# Patient Record
Sex: Female | Born: 1950 | Race: White | Hispanic: No | Marital: Married | Smoking: Former smoker
Health system: Southern US, Community
[De-identification: ages and names within clinical notes are randomized; demographics above are authoritative.]

## PROBLEM LIST (undated history)

## (undated) DIAGNOSIS — Z9289 Personal history of other medical treatment: Secondary | ICD-10-CM

## (undated) DIAGNOSIS — J189 Pneumonia, unspecified organism: Secondary | ICD-10-CM

## (undated) DIAGNOSIS — R51 Headache: Secondary | ICD-10-CM

## (undated) DIAGNOSIS — R059 Cough, unspecified: Secondary | ICD-10-CM

## (undated) DIAGNOSIS — R011 Cardiac murmur, unspecified: Secondary | ICD-10-CM

## (undated) DIAGNOSIS — T4145XA Adverse effect of unspecified anesthetic, initial encounter: Secondary | ICD-10-CM

## (undated) DIAGNOSIS — R05 Cough: Secondary | ICD-10-CM

## (undated) DIAGNOSIS — I509 Heart failure, unspecified: Secondary | ICD-10-CM

## (undated) DIAGNOSIS — C9110 Chronic lymphocytic leukemia of B-cell type not having achieved remission: Secondary | ICD-10-CM

## (undated) DIAGNOSIS — R112 Nausea with vomiting, unspecified: Secondary | ICD-10-CM

## (undated) DIAGNOSIS — Z87891 Personal history of nicotine dependence: Secondary | ICD-10-CM

## (undated) DIAGNOSIS — I5033 Acute on chronic diastolic (congestive) heart failure: Principal | ICD-10-CM

## (undated) DIAGNOSIS — T8859XA Other complications of anesthesia, initial encounter: Secondary | ICD-10-CM

## (undated) DIAGNOSIS — Z9889 Other specified postprocedural states: Secondary | ICD-10-CM

## (undated) DIAGNOSIS — F419 Anxiety disorder, unspecified: Secondary | ICD-10-CM

## (undated) DIAGNOSIS — I050 Rheumatic mitral stenosis: Secondary | ICD-10-CM

## (undated) DIAGNOSIS — K219 Gastro-esophageal reflux disease without esophagitis: Secondary | ICD-10-CM

## (undated) DIAGNOSIS — M797 Fibromyalgia: Secondary | ICD-10-CM

## (undated) DIAGNOSIS — I340 Nonrheumatic mitral (valve) insufficiency: Secondary | ICD-10-CM

## (undated) DIAGNOSIS — I499 Cardiac arrhythmia, unspecified: Secondary | ICD-10-CM

## (undated) DIAGNOSIS — I5032 Chronic diastolic (congestive) heart failure: Secondary | ICD-10-CM

## (undated) DIAGNOSIS — Z954 Presence of other heart-valve replacement: Secondary | ICD-10-CM

## (undated) DIAGNOSIS — K5792 Diverticulitis of intestine, part unspecified, without perforation or abscess without bleeding: Secondary | ICD-10-CM

## (undated) HISTORY — PX: TOTAL VAGINAL HYSTERECTOMY: SHX2548

## (undated) HISTORY — DX: Personal history of other medical treatment: Z92.89

## (undated) HISTORY — PX: NO PAST SURGERIES: SHX2092

## (undated) HISTORY — PX: TONSILLECTOMY: SUR1361

## (undated) HISTORY — PX: APPENDECTOMY: SHX54

---

## 1997-12-16 ENCOUNTER — Encounter: Payer: Self-pay | Admitting: Internal Medicine

## 1997-12-16 ENCOUNTER — Inpatient Hospital Stay (HOSPITAL_COMMUNITY): Admission: EM | Admit: 1997-12-16 | Discharge: 1997-12-19 | Payer: Self-pay | Admitting: Emergency Medicine

## 1998-02-14 ENCOUNTER — Ambulatory Visit (HOSPITAL_COMMUNITY): Admission: RE | Admit: 1998-02-14 | Discharge: 1998-02-14 | Payer: Self-pay | Admitting: Gastroenterology

## 1998-02-14 ENCOUNTER — Encounter: Payer: Self-pay | Admitting: Gastroenterology

## 1999-06-17 ENCOUNTER — Encounter: Payer: Self-pay | Admitting: Internal Medicine

## 1999-06-17 ENCOUNTER — Encounter: Admission: RE | Admit: 1999-06-17 | Discharge: 1999-06-17 | Payer: Self-pay | Admitting: Internal Medicine

## 1999-08-06 ENCOUNTER — Encounter: Payer: Self-pay | Admitting: Internal Medicine

## 1999-08-06 ENCOUNTER — Inpatient Hospital Stay (HOSPITAL_COMMUNITY): Admission: AD | Admit: 1999-08-06 | Discharge: 1999-08-11 | Payer: Self-pay | Admitting: Internal Medicine

## 1999-12-25 ENCOUNTER — Encounter: Payer: Self-pay | Admitting: Internal Medicine

## 1999-12-25 ENCOUNTER — Encounter: Admission: RE | Admit: 1999-12-25 | Discharge: 1999-12-25 | Payer: Self-pay | Admitting: Internal Medicine

## 2000-09-06 ENCOUNTER — Encounter: Admission: RE | Admit: 2000-09-06 | Discharge: 2000-09-06 | Payer: Self-pay | Admitting: Internal Medicine

## 2000-09-06 ENCOUNTER — Encounter: Payer: Self-pay | Admitting: Internal Medicine

## 2000-09-18 ENCOUNTER — Emergency Department (HOSPITAL_COMMUNITY): Admission: EM | Admit: 2000-09-18 | Discharge: 2000-09-18 | Payer: Self-pay | Admitting: *Deleted

## 2001-04-15 ENCOUNTER — Ambulatory Visit (HOSPITAL_BASED_OUTPATIENT_CLINIC_OR_DEPARTMENT_OTHER): Admission: RE | Admit: 2001-04-15 | Discharge: 2001-04-15 | Payer: Self-pay | Admitting: Otolaryngology

## 2001-10-26 ENCOUNTER — Emergency Department (HOSPITAL_COMMUNITY): Admission: EM | Admit: 2001-10-26 | Discharge: 2001-10-26 | Payer: Self-pay | Admitting: Emergency Medicine

## 2001-10-26 ENCOUNTER — Encounter: Payer: Self-pay | Admitting: Emergency Medicine

## 2001-10-26 ENCOUNTER — Inpatient Hospital Stay (HOSPITAL_COMMUNITY): Admission: EM | Admit: 2001-10-26 | Discharge: 2001-10-30 | Payer: Self-pay | Admitting: Internal Medicine

## 2001-10-26 ENCOUNTER — Encounter: Payer: Self-pay | Admitting: Internal Medicine

## 2001-10-27 ENCOUNTER — Encounter: Payer: Self-pay | Admitting: Internal Medicine

## 2001-10-28 ENCOUNTER — Encounter: Payer: Self-pay | Admitting: Internal Medicine

## 2002-03-26 ENCOUNTER — Emergency Department (HOSPITAL_COMMUNITY): Admission: EM | Admit: 2002-03-26 | Discharge: 2002-03-26 | Payer: Self-pay | Admitting: Emergency Medicine

## 2002-03-26 ENCOUNTER — Encounter: Payer: Self-pay | Admitting: Emergency Medicine

## 2002-06-28 ENCOUNTER — Ambulatory Visit (HOSPITAL_COMMUNITY): Admission: RE | Admit: 2002-06-28 | Discharge: 2002-06-28 | Payer: Self-pay | Admitting: *Deleted

## 2008-01-02 ENCOUNTER — Ambulatory Visit: Payer: Self-pay | Admitting: Hematology and Oncology

## 2008-01-12 ENCOUNTER — Ambulatory Visit (HOSPITAL_COMMUNITY): Admission: RE | Admit: 2008-01-12 | Discharge: 2008-01-12 | Payer: Self-pay | Admitting: Hematology and Oncology

## 2008-01-12 LAB — COMPREHENSIVE METABOLIC PANEL
ALT: 19 U/L (ref 0–35)
AST: 20 U/L (ref 0–37)
Calcium: 9.3 mg/dL (ref 8.4–10.5)
Chloride: 106 mEq/L (ref 96–112)
Creatinine, Ser: 0.84 mg/dL (ref 0.40–1.20)
Total Bilirubin: 0.5 mg/dL (ref 0.3–1.2)

## 2008-01-12 LAB — URINALYSIS, MICROSCOPIC - CHCC
Bilirubin (Urine): NEGATIVE
Glucose: NEGATIVE g/dL
Ketones: NEGATIVE mg/dL
Leukocyte Esterase: NEGATIVE

## 2008-01-12 LAB — CBC WITH DIFFERENTIAL/PLATELET
BASO%: 0.3 % (ref 0.0–2.0)
Basophils Absolute: 0 10*3/uL (ref 0.0–0.1)
EOS%: 0.3 % (ref 0.0–7.0)
Eosinophils Absolute: 0 10*3/uL (ref 0.0–0.5)
HCT: 42.1 % (ref 34.8–46.6)
HGB: 14.5 g/dL (ref 11.6–15.9)
LYMPH%: 31.8 % (ref 14.0–48.0)
MCH: 29.8 pg (ref 26.0–34.0)
MCHC: 34.4 g/dL (ref 32.0–36.0)
MCV: 86.6 fL (ref 81.0–101.0)
MONO#: 0.9 10*3/uL (ref 0.1–0.9)
MONO%: 8 % (ref 0.0–13.0)
NEUT#: 6.6 10*3/uL — ABNORMAL HIGH (ref 1.5–6.5)
NEUT%: 59.6 % (ref 39.6–76.8)
Platelets: 200 10*3/uL (ref 145–400)
RBC: 4.87 10*6/uL (ref 3.70–5.32)
RDW: 13.6 % (ref 11.3–14.5)
WBC: 11 10*3/uL — ABNORMAL HIGH (ref 3.9–10.0)
lymph#: 3.5 10*3/uL — ABNORMAL HIGH (ref 0.9–3.3)

## 2008-01-16 LAB — PROTEIN ELECTROPHORESIS, SERUM, WITH REFLEX
Albumin ELP: 57.2 % (ref 55.8–66.1)
Alpha-1-Globulin: 5.3 % — ABNORMAL HIGH (ref 2.9–4.9)
Beta 2: 5.6 % (ref 3.2–6.5)
Beta Globulin: 7.5 % — ABNORMAL HIGH (ref 4.7–7.2)

## 2008-02-16 ENCOUNTER — Ambulatory Visit: Payer: Self-pay | Admitting: Internal Medicine

## 2008-02-16 DIAGNOSIS — R05 Cough: Secondary | ICD-10-CM

## 2008-02-16 DIAGNOSIS — K219 Gastro-esophageal reflux disease without esophagitis: Secondary | ICD-10-CM

## 2008-02-16 DIAGNOSIS — IMO0001 Reserved for inherently not codable concepts without codable children: Secondary | ICD-10-CM

## 2008-02-16 DIAGNOSIS — G43909 Migraine, unspecified, not intractable, without status migrainosus: Secondary | ICD-10-CM | POA: Insufficient documentation

## 2008-02-28 ENCOUNTER — Ambulatory Visit: Payer: Self-pay | Admitting: Internal Medicine

## 2008-03-05 ENCOUNTER — Ambulatory Visit: Payer: Self-pay | Admitting: Cardiovascular Disease

## 2008-03-13 ENCOUNTER — Ambulatory Visit: Payer: Self-pay | Admitting: Internal Medicine

## 2008-04-10 ENCOUNTER — Ambulatory Visit: Payer: Self-pay | Admitting: Internal Medicine

## 2008-04-11 ENCOUNTER — Telehealth (INDEPENDENT_AMBULATORY_CARE_PROVIDER_SITE_OTHER): Payer: Self-pay | Admitting: *Deleted

## 2008-04-24 ENCOUNTER — Encounter: Payer: Self-pay | Admitting: Internal Medicine

## 2008-07-03 ENCOUNTER — Encounter: Payer: Self-pay | Admitting: Internal Medicine

## 2008-07-09 ENCOUNTER — Ambulatory Visit: Payer: Self-pay | Admitting: Hematology and Oncology

## 2009-01-18 ENCOUNTER — Inpatient Hospital Stay (HOSPITAL_BASED_OUTPATIENT_CLINIC_OR_DEPARTMENT_OTHER): Admission: RE | Admit: 2009-01-18 | Discharge: 2009-01-18 | Payer: Self-pay | Admitting: Cardiology

## 2010-08-22 NOTE — Discharge Summary (Signed)
[place]. [facility]  Patient:    Priscilla Stevens, Priscilla Stevens                   MRN: [id number] Adm. Date:  [id number] Disc. Date: [id number] Attending:  Leanord Hawking                           Discharge Summary  ADMITTING DIAGNOSIS: Abdominal pain, question of pyelonephritis.  DISCHARGE DIAGNOSES:  1. Diverticulitis.  2. Obesity.  3. Chronic obstructive pulmonary disease.  4. Depression.  5. Past history of elevated blood pressure, although hypotensive in hospital.  6. History of iron deficiency anemia.  7. Gastroesophageal reflux disease.  HISTORY OF PRESENT ILLNESS: Priscilla Stevens is a nice 60 year old white female who had been feeling poorly the week prior to admission with off and on abdominal pain.  On the day prior to admission she developed a temperature of 102 degrees associated with left flank pain, no diarrhea.  Bowels had been moving normally.  She had Hemoccult negative stools on July 31, 1999.  The evening prior to admission she was seen in the walk-in clinic and was felt to have a UTI or pyelonephritis and was given Cipro and Darvocet.  The pain has continued and was quite severe, and she had vomited twice the morning of admission.  PHYSICAL EXAMINATION:  VITAL SIGNS: Temperature 97.1 degrees, pulse 76, blood pressure 110/70.  HEENT: Unremarkable.  LUNGS: Clear.  HEART: Regular rate and rhythm without murmurs, rubs, or gallops.  ABDOMEN: Obese, active bowel sounds.  Tender in the left lower quadrant and in the left flank.  She had positive CVA tenderness.  LABORATORY DATA: Admission hemoglobin 13.4, platelet count 217,000, WBC 16,500.  Sodium 137, potassium 3.6, chloride 102, bicarbonate 27, glucose 94, BUN 10, creatinine 0.9.  Calcium 9.1, total protein 7.2, albumin 3.7, AST 20, ALT 20.  Urinalysis normal.  Chest x-ray revealed no active disease.  HOSPITAL COURSE: The patient was empirically placed on Unasyn at the time  of admission.  CT scan of the abdomen and pelvis with and without contrast revealed findings compatible with diverticulitis of the descending colon.  She was continued on Unasyn and Demerol.  Her temperature maximum was 100.9 degrees and over the period of 48 hours her pain gradually improved and she was able to eat a regular diet.  Her hospitalization was complicated by hypotension, with systolics running as low as 88-99.  Her Demerol was discontinued the day prior to discharge and her systolic pressure had come up to 108, and her pain was totally resolved.  She did have a mild anemia, although her stool sent to the laboratory was Hemoccult negative, and hemoglobin came back at 11.4.  CBC is pending at this time.  DISCHARGE CONDITION: The patient is discharged home in improved condition.  DISCHARGE MEDICATIONS:  1. Prevacid 30 mg q.d.  2. Wellbutrin 150 mg b.i.d.  3. Trazodone 50 mg at night.  4. Estradiol 2 mg q.d.  5. Augmentin 875 mg b.i.d. for 5 days.  FOLLOW-UP:  She will see Dr. Prentiss Bells in the office in about two weeks. DD:  08/11/99 TD:  08/12/99 Job: [id number]

## 2010-08-22 NOTE — H&P (Signed)
[place]. [facility]  Patient:    Priscilla Stevens, Priscilla Stevens                     MRN: [id number] Adm. Date:  08/06/99 Attending:  Ann Maki T. Stoneking, M.D.                         History and Physical  IDENTIFYING DATA:  Priscilla Stevens is a 60 year old white female followed by Dr. Mosetta Anis.  She was in her usual state of health until a week ago. She started feeling poorly off and on with stomach pain, more on the left side.  Then yesterday, she developed a temperature of 102 associated with left flank pain, no diarrhea.  She tells me that her bowels have been moving normally and in fact she had a recent physical exam and Hemoccults were negative x 3 on July 31, 1999.  Yesterday evening, she was seen in the walk-in clinic and found to have an abnormal urinalysis and was placed on Cipro and Darvocet.  Today, she has continued with the abdominal pain, flank pain, and vomiting.  ALLERGIES:  No known drug allergies.  PAST MEDICAL HISTORY:  Obesity, COPD, depression, hypertension, iron-deficiency anemia, GERD.  PAST SURGICAL HISTORY:  She has had a hysterectomy.  CURRENT MEDICATIONS: 1. Prevacid 30 mg a day. 2. Wellbutrin 150 mg q.12h. 3. Trazodone 50 mg q.hs. 4. Estradiol 2 mg a day.  SOCIAL HISTORY:  Remarkable for cigarette use.  She does not consume alcohol.  FAMILY HISTORY:  Father died in his 38s of lung cancer.  Mother in good health.  Siblings in good health.  REVIEW OF SYSTEMS:  No headache.  No change in vision or hearing.  No cough. No shortness of breath.  GI:  Please see HPI.  GU:  Denies any specific burning or stinging on urination.  She does state that she may have had a kidney stone years ago.  PHYSICAL EXAMINATION:  VITAL SIGNS:  Temperature 97.1, pulse 76, blood pressure 110/70.  HEENT:  Pupils are equal, round and reactive to light.  Disks sharp. Vasculature normal.  TMs normal.  Oropharynx moist.  NECK:  No JVD or  thyromegaly.  LUNGS:  Clear.  HEART:  Regular rate and rhythm without murmurs, rubs, or gallops.  ABDOMEN:  Bowel sounds present but decreased.  Tenderness, especially in the left lower quadrant and left upper quadrant and she had definite percussion tenderness at the left CVA.  EXTREMITIES:  No lower extremity edema.  ASSESSMENT: 1. Febrile illness with left flank, left lower quadrant pain with a recent    abnormal urinalysis.  Must be concerned about pylo and she will need    antibiotics IV, as she is having trouble with nausea and vomiting.  Also a    possibility of diverticulitis and also a possibility of kidney stone. 2. Dehydration from nausea and vomiting.  PLAN:  We will admit.  We will begin IV Unasyn.  We will check blood and urine cultures.  Chest x-ray to rule out any abnormalities at the left base where she is having pain.  Also obtain a CT scan of the abdomen and pelvis to look for possibility of diverticulitis and the possibility of kidney stone. DD:  08/06/99 TD:  08/06/99 Job: [id number]

## 2010-08-22 NOTE — Discharge Summary (Signed)
NAME:  Priscilla Stevens, Priscilla Stevens                      ACCOUNT NO.:  [id number]   MEDICAL RECORD NO.:  [id number]                   PATIENT TYPE:  INP   LOCATION:  0481                                 FACILITY:  [facility]   PHYSICIAN:  Jerl Santos, M.D.              DATE OF BIRTH:  [birth year]   DATE OF ADMISSION:  10/26/2001  DATE OF DISCHARGE:  10/30/2001                                 DISCHARGE SUMMARY   HISTORY OF PRESENT ILLNESS:  The patient is a 60 year old lady who was  admitted on July 23 with a three day history of nausea, vomiting and diffuse  abdominal pain. Prior to admission, the patient had a CT scan of the abdomen  which did not provide a definite diagnosis.   PAST MEDICAL HISTORY:  Remarkable for a previous hysterectomy.   PHYSICAL EXAMINATION ON ADMISSION:  VITAL SIGNS:  Temperature is 98.8, pulse  96, blood pressure is 140/98. HEENT:  Within normal limits. CHEST:  Clear.  BACK:  Revealed chronic low back pain to palpation and percussion.  CARDIOVASCULAR:  Revealed normal S1, S2 without murmurs, rubs or gallops.  ABDOMEN:  Benign, normal bowel sounds without masses or tenderness. There is  no guarding or rebound. NEUROLOGIC:  Testing and examination of extremities  was normal.   HOSPITAL COURSE:  On admission, the patient was placed on a regimen of IV  hydration with D5 1/2 normal saline at 10 mEq of KCl. She was made n.p.o.  and she was given morphine for pain. Studies obtained included a chest x-ray  which showed only mild cardiomegaly and a hepatobiliary scan which was  normal. Abdominal ultrasound showed only mild fatty infiltration of the  liver. I consulted Dr. Luan Pulling of the surgery service who felt the  patient did not have a surgical problem. Subsequently, the patient indicated  that her pain had changed such that she was now experiencing pain in the low  back area. She ultimately underwent an MRI scan of the back which was  completely normal. She was  administered cortisone for possible back  discomfort and musculoskeletal origin and ultimately on July 29 was  discharged. At the time of discharge, she was advised to continue her usual  hom medications and also to take Vicodin 1 or 2 every 4h p.r.n. for pain,  Skelaxin at 400 mg 1 or 2 p.r.n. for muscle spasm and prednisone 40 mg q.d.  for 4 days. A follow-up appointment was to be arranged with Dr. Tresa Endo in  two weeks.   DISCHARGE DIAGNOSES:  1. Persistent abdominal pain, nausea and vomiting, resolved possibly     secondary to gastroenteritis.  2. Depression.  3. Chronic obstructive pulmonary disease.  4. Chronic migraine headaches.  5. Obesity.  6. Status post hysterectomy.  7. Chronic low back pain possibly of musculoskeletal origin.  Jerl Santos, M.D.    SWY/MEDQ  D:  11/04/2001  T:  11/05/2001  Job:  [id number]

## 2010-09-22 ENCOUNTER — Emergency Department (HOSPITAL_COMMUNITY): Payer: BC Managed Care – PPO

## 2010-09-22 ENCOUNTER — Emergency Department (HOSPITAL_COMMUNITY)
Admission: EM | Admit: 2010-09-22 | Discharge: 2010-09-23 | Disposition: A | Discharge status: Home / Self Care | Payer: BC Managed Care – PPO | Attending: Emergency Medicine | Admitting: Emergency Medicine

## 2010-09-22 DIAGNOSIS — X500XXA Overexertion from strenuous movement or load, initial encounter: Secondary | ICD-10-CM | POA: Insufficient documentation

## 2010-09-22 DIAGNOSIS — M25579 Pain in unspecified ankle and joints of unspecified foot: Secondary | ICD-10-CM | POA: Insufficient documentation

## 2010-09-22 DIAGNOSIS — S93409A Sprain of unspecified ligament of unspecified ankle, initial encounter: Secondary | ICD-10-CM | POA: Insufficient documentation

## 2010-12-02 DIAGNOSIS — J329 Chronic sinusitis, unspecified: Secondary | ICD-10-CM | POA: Insufficient documentation

## 2012-06-19 ENCOUNTER — Inpatient Hospital Stay (HOSPITAL_COMMUNITY)
Admission: EM | Admit: 2012-06-19 | Discharge: 2012-06-23 | DRG: 286 | Disposition: A | Discharge status: Home Health | Payer: Medicare Other | Attending: Internal Medicine | Admitting: Internal Medicine

## 2012-06-19 ENCOUNTER — Emergency Department (HOSPITAL_COMMUNITY): Payer: Medicare Other

## 2012-06-19 ENCOUNTER — Encounter (HOSPITAL_COMMUNITY): Payer: Self-pay | Admitting: *Deleted

## 2012-06-19 DIAGNOSIS — C9110 Chronic lymphocytic leukemia of B-cell type not having achieved remission: Secondary | ICD-10-CM

## 2012-06-19 DIAGNOSIS — K219 Gastro-esophageal reflux disease without esophagitis: Secondary | ICD-10-CM | POA: Diagnosis present

## 2012-06-19 DIAGNOSIS — F3289 Other specified depressive episodes: Secondary | ICD-10-CM | POA: Diagnosis present

## 2012-06-19 DIAGNOSIS — I4892 Unspecified atrial flutter: Secondary | ICD-10-CM

## 2012-06-19 DIAGNOSIS — D72829 Elevated white blood cell count, unspecified: Secondary | ICD-10-CM

## 2012-06-19 DIAGNOSIS — I340 Nonrheumatic mitral (valve) insufficiency: Secondary | ICD-10-CM | POA: Diagnosis present

## 2012-06-19 DIAGNOSIS — I509 Heart failure, unspecified: Secondary | ICD-10-CM

## 2012-06-19 DIAGNOSIS — J811 Chronic pulmonary edema: Secondary | ICD-10-CM | POA: Diagnosis present

## 2012-06-19 DIAGNOSIS — R05 Cough: Secondary | ICD-10-CM

## 2012-06-19 DIAGNOSIS — I5033 Acute on chronic diastolic (congestive) heart failure: Principal | ICD-10-CM

## 2012-06-19 DIAGNOSIS — F411 Generalized anxiety disorder: Secondary | ICD-10-CM | POA: Diagnosis present

## 2012-06-19 DIAGNOSIS — J9600 Acute respiratory failure, unspecified whether with hypoxia or hypercapnia: Secondary | ICD-10-CM | POA: Diagnosis present

## 2012-06-19 DIAGNOSIS — I471 Supraventricular tachycardia, unspecified: Secondary | ICD-10-CM

## 2012-06-19 DIAGNOSIS — R7989 Other specified abnormal findings of blood chemistry: Secondary | ICD-10-CM

## 2012-06-19 DIAGNOSIS — R0603 Acute respiratory distress: Secondary | ICD-10-CM | POA: Diagnosis present

## 2012-06-19 DIAGNOSIS — I050 Rheumatic mitral stenosis: Secondary | ICD-10-CM

## 2012-06-19 DIAGNOSIS — Z79899 Other long term (current) drug therapy: Secondary | ICD-10-CM

## 2012-06-19 DIAGNOSIS — G43909 Migraine, unspecified, not intractable, without status migrainosus: Secondary | ICD-10-CM | POA: Diagnosis present

## 2012-06-19 DIAGNOSIS — I498 Other specified cardiac arrhythmias: Secondary | ICD-10-CM | POA: Diagnosis present

## 2012-06-19 DIAGNOSIS — IMO0001 Reserved for inherently not codable concepts without codable children: Secondary | ICD-10-CM | POA: Diagnosis present

## 2012-06-19 DIAGNOSIS — R599 Enlarged lymph nodes, unspecified: Secondary | ICD-10-CM | POA: Diagnosis present

## 2012-06-19 DIAGNOSIS — F329 Major depressive disorder, single episode, unspecified: Secondary | ICD-10-CM | POA: Diagnosis present

## 2012-06-19 DIAGNOSIS — R000 Tachycardia, unspecified: Secondary | ICD-10-CM | POA: Diagnosis present

## 2012-06-19 DIAGNOSIS — R059 Cough, unspecified: Secondary | ICD-10-CM

## 2012-06-19 DIAGNOSIS — Z87891 Personal history of nicotine dependence: Secondary | ICD-10-CM

## 2012-06-19 DIAGNOSIS — I052 Rheumatic mitral stenosis with insufficiency: Secondary | ICD-10-CM | POA: Diagnosis present

## 2012-06-19 DIAGNOSIS — I059 Rheumatic mitral valve disease, unspecified: Secondary | ICD-10-CM

## 2012-06-19 HISTORY — DX: Diverticulitis of intestine, part unspecified, without perforation or abscess without bleeding: K57.92

## 2012-06-19 HISTORY — DX: Cough, unspecified: R05.9

## 2012-06-19 HISTORY — DX: Cough: R05

## 2012-06-19 HISTORY — DX: Chronic lymphocytic leukemia of B-cell type not having achieved remission: C91.10

## 2012-06-19 HISTORY — DX: Pneumonia, unspecified organism: J18.9

## 2012-06-19 HISTORY — DX: Rheumatic mitral stenosis: I05.0

## 2012-06-19 HISTORY — DX: Fibromyalgia: M79.7

## 2012-06-19 HISTORY — DX: Anxiety disorder, unspecified: F41.9

## 2012-06-19 HISTORY — DX: Personal history of nicotine dependence: Z87.891

## 2012-06-19 HISTORY — DX: Nonrheumatic mitral (valve) insufficiency: I34.0

## 2012-06-19 HISTORY — DX: Morbid (severe) obesity due to excess calories: E66.01

## 2012-06-19 HISTORY — DX: Acute on chronic diastolic (congestive) heart failure: I50.33

## 2012-06-19 MED ORDER — ADENOSINE 6 MG/2ML IV SOLN
INTRAVENOUS | Status: AC
  Administered 2012-06-19: 12 mg
  Filled 2012-06-19: qty 6

## 2012-06-19 MED ORDER — METOPROLOL TARTRATE 1 MG/ML IV SOLN
5.0000 mg | Freq: Once | INTRAVENOUS | Status: AC
  Administered 2012-06-20: 5 mg via INTRAVENOUS
  Filled 2012-06-19: qty 5

## 2012-06-19 MED ORDER — ADENOSINE 6 MG/2ML IV SOLN
INTRAVENOUS | Status: AC
  Administered 2012-06-19: 12 mg
  Filled 2012-06-19: qty 4

## 2012-06-19 MED ORDER — FUROSEMIDE 10 MG/ML IJ SOLN
40.0000 mg | Freq: Once | INTRAMUSCULAR | Status: AC
  Administered 2012-06-20: 40 mg via INTRAVENOUS
  Filled 2012-06-19: qty 4

## 2012-06-19 NOTE — ED Notes (Addendum)
HR remains 145 after 24mg  of adenosine.  Pt was given 18mg  of adenosine by EMS in route.

## 2012-06-19 NOTE — ED Provider Notes (Signed)
History     CSN: [id number]  Arrival date & time 06/19/12  2319   First MD Initiated Contact with Patient 06/19/12 2326     Chief complaint: Shortness of breath  (Consider location/radiation/quality/duration/timing/severity/associated sxs/prior treatment) Patient is a 62 y.o. female presenting with shortness of breath. The history is provided by the patient.  Shortness of Breath She had sudden onset at about 11 PM of difficulty breathing with associated tightness in her chest. There is no associated chest pain, nausea, diaphoresis. He came as noted rales and was concerned about pulmonary edema and placed on CPAP. She was noted to be tachycardic although she was not aware of any palpitations. As she was given adenosine in doses of 6 mg and then 12 mg with minimal result. She has a history of fibromyalgia but is not complaining of pain anywhere. She's never had symptoms like this before.  Past Medical History  Diagnosis Date  . Fibromyalgia   . Anxiety   . Pneumonia     History reviewed. No pertinent past surgical history.  No family history on file.  History  Substance Use Topics  . Smoking status: Not on file  . Smokeless tobacco: Not on file  . Alcohol Use: Not on file    OB History   Grav Para Term Preterm Abortions TAB SAB Ect Mult Living                  Review of Systems  Respiratory: Positive for shortness of breath.   All other systems reviewed and are negative.    Allergies  Aspirin  Home Medications  No current outpatient prescriptions on file.  BP 105/62  Pulse 114  Resp 24  SpO2 95%  Physical Exam  Nursing note and vitals reviewed.  62 year old female, on BiPAP, and in no acute distress. Vital signs are significant for tachycardia with heart rate of 160, and tachypnea with respiratory rate of 24. Oxygen saturation is 95%, which is normal. Head is normocephalic and atraumatic. PERRLA, EOMI. Oropharynx is clear. Neck is nontender and supple  without adenopathy or JVD. Back is nontender and there is no CVA tenderness. Lungs have diminished breath sounds throughout with bibasilar rales which are more prominent on the left. No wheezes are heard.. Chest is nontender. Heart is tachycardic without murmur. Abdomen is soft, flat, nontender without masses or hepatosplenomegaly and peristalsis is normoactive. Extremities have 1+ edema, full range of motion is present. Skin is warm and dry without rash. Neurologic: Mental status is normal, cranial nerves are intact, there are no motor or sensory deficits.  ED Course  Procedures (including critical care time)  Results for orders placed during the hospital encounter of 06/19/12  CBC WITH DIFFERENTIAL      Result Value Range   WBC 27.9 (*) 4.0 - 10.5 K/uL   RBC 5.16 (*) 3.87 - 5.11 MIL/uL   Hemoglobin 14.6  12.0 - 15.0 g/dL   HCT 65.7  84.6 - 96.2 %   MCV 85.3  78.0 - 100.0 fL   MCH 28.3  26.0 - 34.0 pg   MCHC 33.2  30.0 - 36.0 g/dL   RDW 95.2  84.1 - 32.4 %   Platelets 270  150 - 400 K/uL   Neutrophils Relative 59  43 - 77 %   Lymphocytes Relative 31  12 - 46 %   Monocytes Relative 9  3 - 12 %   Eosinophils Relative 1  0 - 5 %   Basophils  Relative 0  0 - 1 %   Neutro Abs 16.4 (*) 1.7 - 7.7 K/uL   Lymphs Abs 8.7 (*) 0.7 - 4.0 K/uL   Monocytes Absolute 2.5 (*) 0.1 - 1.0 K/uL   Eosinophils Absolute 0.3  0.0 - 0.7 K/uL   Basophils Absolute 0.0  0.0 - 0.1 K/uL   WBC Morphology ATYPICAL LYMPHOCYTES     Smear Review LARGE PLATELETS PRESENT    BASIC METABOLIC PANEL      Result Value Range   Sodium 139  135 - 145 mEq/L   Potassium 4.2  3.5 - 5.1 mEq/L   Chloride 102  96 - 112 mEq/L   CO2 24  19 - 32 mEq/L   Glucose, Bld 227 (*) 70 - 99 mg/dL   BUN 13  6 - 23 mg/dL   Creatinine, Ser 1.61  0.50 - 1.10 mg/dL   Calcium 9.1  8.4 - 09.6 mg/dL   GFR calc non Af Amer 54 (*) >90 mL/min   GFR calc Af Amer 63 (*) >90 mL/min  PRO B NATRIURETIC PEPTIDE      Result Value Range   Pro B  Natriuretic peptide (BNP) 1050.0 (*) 0 - 125 pg/mL  TROPONIN I      Result Value Range   Troponin I <0.30  <0.30 ng/mL  URINALYSIS, MICROSCOPIC ONLY      Result Value Range   Color, Urine YELLOW  YELLOW   APPearance CLEAR  CLEAR   Specific Gravity, Urine 1.017  1.005 - 1.030   pH 5.0  5.0 - 8.0   Glucose, UA NEGATIVE  NEGATIVE mg/dL   Hgb urine dipstick TRACE (*) NEGATIVE   Bilirubin Urine NEGATIVE  NEGATIVE   Ketones, ur NEGATIVE  NEGATIVE mg/dL   Protein, ur 30 (*) NEGATIVE mg/dL   Urobilinogen, UA 0.2  0.0 - 1.0 mg/dL   Nitrite NEGATIVE  NEGATIVE   Leukocytes, UA NEGATIVE  NEGATIVE   WBC, UA 0-2  <3 WBC/hpf   RBC / HPF 3-6  <3 RBC/hpf   Bacteria, UA RARE  RARE   Squamous Epithelial / LPF RARE  RARE   Casts HYALINE CASTS (*) NEGATIVE   Dg Chest Portable 1 View  06/20/2012  *RADIOLOGY REPORT*  Clinical Data: Severe shortness of breath.  Pulmonary edema. Cardiomegaly.  PORTABLE CHEST - 1 VIEW  Comparison: 12/27/2008  Findings: The shallow inspiration.  Cardiac enlargement with mild prominence of pulmonary vascularity.  Diffuse interstitial changes suggesting interstitial edema.  Changes are new since previous study.  No blunting of costophrenic angles.  No pneumothorax.  No focal airspace disease.  IMPRESSION: Cardiac enlargement with pulmonary vascular congestion and interstitial edema.   Original Report Authenticated By: Burman Nieves, M.D.     Images viewed by me.   Date: 06/19/2012  Rate: 160  Rhythm: supraventricular tachycardia (SVT)  QRS Axis: right  Intervals: normal  ST/T Wave abnormalities: nonspecific ST changes  Conduction Disutrbances:none  Narrative Interpretation: Paroxysmal supraventricular tachycardia with the minor ST changes which are probably rate related. Right axis deviation. No prior ECG available for comparison.  Old EKG Reviewed: none available    1. Supraventricular tachycardia   2. CHF, acute   3. Leukocytosis     CRITICAL CARE Performed  by: EAVWU,[email or web address]   Total critical care time: 45 minutes  Critical care time was exclusive of separately billable procedures and treating other patients.  Critical care was necessary to treat or prevent imminent or life-threatening deterioration.  Critical care was time spent  personally by me on the following activities: development of treatment plan with patient and/or surrogate as well as nursing, discussions with consultants, evaluation of patient's response to treatment, examination of patient, obtaining history from patient or surrogate, ordering and performing treatments and interventions, ordering and review of laboratory studies, ordering and review of radiographic studies, pulse oximetry and re-evaluation of patient's condition.   MDM  Supraventricular tachycardia with possible congestive heart failure. Old records are reviewed and she was noted to have a heart catheterization in 2010 which showed no significant coronary artery disease but evidence of diastolic dysfunction. Laboratory workup is initiated and chest x-ray obtained. ECG shows SVT.  Since she had already received 2 doses of adenosine, it was elected to proceed with a third dose of adenosine 12 mg. Following this, she was briefly in sinus rhythm before returning to PSVT. Because of a partial response it was elected to give a fourth dose of adenosine which did not cause any significant response. Chest x-ray does show evidence of mild pulmonary edema. She's given a dose of furosemide and a dose of metoprolol.  Following meds metoprolol, her heart rate has come down to 110-115 and is clearly sinus. She feels much better. WBC has come back markedly elevated at 27.9 with a normal differential and report of atypical lymphocytes. 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 However,  elevated WBC does not appear to be a pattern consistent with CLL. There is no lymphocytic predominance and there no smudge cells. Case is discussed with Dr. Mikeal Hawthorne of [facility] who agrees to admit the patient to request that CT angiogram be obtained to rule out pulmonary embolism.       Dione Booze, MD 06/20/12 [id number]

## 2012-06-19 NOTE — ED Notes (Signed)
Per EMS:  Pt called for chest tightness, upon EMS' arrival pt's HR was 158, went to 180.  EMS auscultated crackles in bilateral lower lobes.  EMS gave 6 of adenosine and then 12, pulse went down to 142.  Resps were 28-32 and shallow.  Pt alert and oriented x4.  Pt still having SOB on cpap.

## 2012-06-20 ENCOUNTER — Observation Stay (HOSPITAL_COMMUNITY): Payer: Medicare Other

## 2012-06-20 ENCOUNTER — Encounter (HOSPITAL_COMMUNITY): Payer: Self-pay | Admitting: Internal Medicine

## 2012-06-20 ENCOUNTER — Other Ambulatory Visit: Payer: Self-pay

## 2012-06-20 DIAGNOSIS — R0603 Acute respiratory distress: Secondary | ICD-10-CM | POA: Diagnosis present

## 2012-06-20 DIAGNOSIS — D72829 Elevated white blood cell count, unspecified: Secondary | ICD-10-CM | POA: Diagnosis present

## 2012-06-20 DIAGNOSIS — I059 Rheumatic mitral valve disease, unspecified: Secondary | ICD-10-CM

## 2012-06-20 DIAGNOSIS — R000 Tachycardia, unspecified: Secondary | ICD-10-CM | POA: Diagnosis present

## 2012-06-20 DIAGNOSIS — C9110 Chronic lymphocytic leukemia of B-cell type not having achieved remission: Secondary | ICD-10-CM | POA: Insufficient documentation

## 2012-06-20 DIAGNOSIS — I498 Other specified cardiac arrhythmias: Secondary | ICD-10-CM

## 2012-06-20 DIAGNOSIS — I5031 Acute diastolic (congestive) heart failure: Secondary | ICD-10-CM

## 2012-06-20 DIAGNOSIS — J811 Chronic pulmonary edema: Secondary | ICD-10-CM | POA: Diagnosis present

## 2012-06-20 DIAGNOSIS — I509 Heart failure, unspecified: Secondary | ICD-10-CM | POA: Diagnosis present

## 2012-06-20 DIAGNOSIS — I050 Rheumatic mitral stenosis: Secondary | ICD-10-CM

## 2012-06-20 DIAGNOSIS — I340 Nonrheumatic mitral (valve) insufficiency: Secondary | ICD-10-CM

## 2012-06-20 HISTORY — DX: Nonrheumatic mitral (valve) insufficiency: I34.0

## 2012-06-20 HISTORY — DX: Rheumatic mitral stenosis: I05.0

## 2012-06-20 HISTORY — DX: Morbid (severe) obesity due to excess calories: E66.01

## 2012-06-20 LAB — TROPONIN I
Troponin I: <0.3 ng/mL (ref <0.30)
Troponin I: <0.3 ng/mL (ref <0.30)
Troponin I: 0.43 ng/mL (ref <0.30)
Troponin I: <0.3 ng/mL (ref <0.30)

## 2012-06-20 LAB — CBC
Platelets: 198 10*3/uL (ref 150–400)
RDW: 14.7 % (ref 11.5–15.5)
WBC: 16.8 10*3/uL — ABNORMAL HIGH (ref 4.0–10.5)

## 2012-06-20 LAB — BASIC METABOLIC PANEL
BUN: 13 mg/dL (ref 6–23)
Chloride: 102 mEq/L (ref 96–112)
GFR calc Af Amer: 63 mL/min — ABNORMAL LOW (ref >90)
GFR calc non Af Amer: 54 mL/min — ABNORMAL LOW (ref >90)
Glucose, Bld: 227 mg/dL — ABNORMAL HIGH (ref 70–99)
Potassium: 4.2 mEq/L (ref 3.5–5.1)
Sodium: 139 mEq/L (ref 135–145)

## 2012-06-20 LAB — CBC WITH DIFFERENTIAL/PLATELET
Basophils Absolute: 0 10*3/uL (ref 0.0–0.1)
Eosinophils Relative: 1 % (ref 0–5)
HCT: 44 % (ref 36.0–46.0)
Lymphocytes Relative: 31 % (ref 12–46)
Lymphs Abs: 8.7 10*3/uL — ABNORMAL HIGH (ref 0.7–4.0)
MCV: 85.3 fL (ref 78.0–100.0)
Monocytes Relative: 9 % (ref 3–12)
Neutro Abs: 16.4 10*3/uL — ABNORMAL HIGH (ref 1.7–7.7)
RBC: 5.16 MIL/uL — ABNORMAL HIGH (ref 3.87–5.11)
RDW: 14.8 % (ref 11.5–15.5)
WBC: 27.9 10*3/uL — ABNORMAL HIGH (ref 4.0–10.5)

## 2012-06-20 LAB — COMPREHENSIVE METABOLIC PANEL
AST: 20 U/L (ref 0–37)
Albumin: 3.4 g/dL — ABNORMAL LOW (ref 3.5–5.2)
Alkaline Phosphatase: 88 U/L (ref 39–117)
Chloride: 101 mEq/L (ref 96–112)
Potassium: 4 mEq/L (ref 3.5–5.1)
Total Bilirubin: 0.3 mg/dL (ref 0.3–1.2)
Total Protein: 6.8 g/dL (ref 6.0–8.3)

## 2012-06-20 LAB — URINALYSIS, MICROSCOPIC ONLY
Ketones, ur: NEGATIVE mg/dL
Leukocytes, UA: NEGATIVE
Nitrite: NEGATIVE
Protein, ur: 30 mg/dL — AB

## 2012-06-20 LAB — TSH: TSH: 1.812 u[IU]/mL (ref 0.350–4.500)

## 2012-06-20 MED ORDER — LEVALBUTEROL HCL 0.63 MG/3ML IN NEBU
0.6300 mg | INHALATION_SOLUTION | RESPIRATORY_TRACT | Status: DC | PRN
  Filled 2012-06-20: qty 3

## 2012-06-20 MED ORDER — TRAZODONE HCL 100 MG PO TABS
100.0000 mg | ORAL_TABLET | Freq: Every day | ORAL | Status: DC
  Administered 2012-06-20 – 2012-06-21 (×2): 100 mg via ORAL
  Filled 2012-06-20 (×3): qty 1

## 2012-06-20 MED ORDER — ALBUTEROL SULFATE (5 MG/ML) 0.5% IN NEBU: 2.5000 mg | INHALATION_SOLUTION | RESPIRATORY_TRACT | Status: DC | PRN

## 2012-06-20 MED ORDER — ONDANSETRON HCL 4 MG PO TABS: 4.0000 mg | ORAL_TABLET | Freq: Four times a day (QID) | ORAL | Status: DC | PRN

## 2012-06-20 MED ORDER — ASPIRIN 81 MG PO CHEW
324.0000 mg | CHEWABLE_TABLET | ORAL | Status: AC
  Administered 2012-06-21: 324 mg via ORAL
  Filled 2012-06-20: qty 4

## 2012-06-20 MED ORDER — ENOXAPARIN SODIUM 40 MG/0.4ML ~~LOC~~ SOLN
40.0000 mg | SUBCUTANEOUS | Status: DC
  Administered 2012-06-21: 40 mg via SUBCUTANEOUS
  Filled 2012-06-20 (×3): qty 0.4

## 2012-06-20 MED ORDER — ASPIRIN 81 MG PO CHEW: 81.0000 mg | CHEWABLE_TABLET | Freq: Every day | ORAL | Status: DC

## 2012-06-20 MED ORDER — BISACODYL 5 MG PO TBEC: 5.0000 mg | DELAYED_RELEASE_TABLET | Freq: Every day | ORAL | Status: DC | PRN

## 2012-06-20 MED ORDER — ARIPIPRAZOLE 15 MG PO TABS
7.5000 mg | ORAL_TABLET | Freq: Every day | ORAL | Status: DC
  Administered 2012-06-20 – 2012-06-22 (×3): 7.5 mg via ORAL
  Filled 2012-06-20 (×3): qty 1

## 2012-06-20 MED ORDER — BIOTENE DRY MOUTH MT LIQD
15.0000 mL | Freq: Two times a day (BID) | OROMUCOSAL | Status: DC
  Administered 2012-06-20 – 2012-06-22 (×5): 15 mL via OROMUCOSAL

## 2012-06-20 MED ORDER — ATORVASTATIN CALCIUM 10 MG PO TABS
10.0000 mg | ORAL_TABLET | Freq: Every day | ORAL | Status: DC
  Administered 2012-06-20 – 2012-06-22 (×3): 10 mg via ORAL
  Filled 2012-06-20 (×4): qty 1

## 2012-06-20 MED ORDER — CLOPIDOGREL BISULFATE 75 MG PO TABS
75.0000 mg | ORAL_TABLET | Freq: Every day | ORAL | Status: DC
  Filled 2012-06-20: qty 1

## 2012-06-20 MED ORDER — SODIUM CHLORIDE 0.9 % IJ SOLN
3.0000 mL | Freq: Two times a day (BID) | INTRAMUSCULAR | Status: DC
  Administered 2012-06-20 – 2012-06-22 (×6): 3 mL via INTRAVENOUS

## 2012-06-20 MED ORDER — TRAMADOL HCL 50 MG PO TABS
50.0000 mg | ORAL_TABLET | Freq: Three times a day (TID) | ORAL | Status: DC | PRN
  Filled 2012-06-20: qty 1

## 2012-06-20 MED ORDER — ONDANSETRON HCL 4 MG/2ML IJ SOLN
4.0000 mg | Freq: Four times a day (QID) | INTRAMUSCULAR | Status: DC | PRN
  Administered 2012-06-20 (×3): 4 mg via INTRAVENOUS
  Filled 2012-06-20 (×3): qty 2

## 2012-06-20 MED ORDER — ACETAMINOPHEN 650 MG RE SUPP: 650.0000 mg | Freq: Four times a day (QID) | RECTAL | Status: DC | PRN

## 2012-06-20 MED ORDER — MORPHINE SULFATE 2 MG/ML IJ SOLN: 2.0000 mg | INTRAMUSCULAR | Status: DC | PRN

## 2012-06-20 MED ORDER — FUROSEMIDE 10 MG/ML IJ SOLN
20.0000 mg | Freq: Two times a day (BID) | INTRAMUSCULAR | Status: DC
  Administered 2012-06-20: 20 mg via INTRAVENOUS

## 2012-06-20 MED ORDER — SODIUM CHLORIDE 0.9 % IV SOLN
INTRAVENOUS | Status: DC
  Administered 2012-06-20: 05:00:00 via INTRAVENOUS

## 2012-06-20 MED ORDER — DOCUSATE SODIUM 100 MG PO CAPS
100.0000 mg | ORAL_CAPSULE | Freq: Two times a day (BID) | ORAL | Status: DC
  Administered 2012-06-20 – 2012-06-22 (×4): 100 mg via ORAL
  Filled 2012-06-20 (×6): qty 1

## 2012-06-20 MED ORDER — SODIUM CHLORIDE 0.9 % IV SOLN
INTRAVENOUS | Status: DC
  Administered 2012-06-21: 10 mL/h via INTRAVENOUS

## 2012-06-20 MED ORDER — FUROSEMIDE 10 MG/ML IJ SOLN
40.0000 mg | Freq: Two times a day (BID) | INTRAMUSCULAR | Status: DC
  Administered 2012-06-20 – 2012-06-22 (×4): 40 mg via INTRAVENOUS
  Filled 2012-06-20 (×5): qty 4

## 2012-06-20 MED ORDER — METOPROLOL TARTRATE 12.5 MG HALF TABLET
12.5000 mg | ORAL_TABLET | Freq: Two times a day (BID) | ORAL | Status: DC
  Administered 2012-06-20 – 2012-06-21 (×4): 12.5 mg via ORAL
  Filled 2012-06-20 (×7): qty 1

## 2012-06-20 MED ORDER — IOHEXOL 350 MG/ML SOLN
100.0000 mL | Freq: Once | INTRAVENOUS | Status: AC | PRN
  Administered 2012-06-20: 100 mL via INTRAVENOUS

## 2012-06-20 MED ORDER — ACETAMINOPHEN 325 MG PO TABS
650.0000 mg | ORAL_TABLET | Freq: Four times a day (QID) | ORAL | Status: DC | PRN
  Administered 2012-06-20: 650 mg via ORAL
  Filled 2012-06-20: qty 2
  Filled 2012-06-20: qty 1

## 2012-06-20 MED ORDER — MAGNESIUM CITRATE PO SOLN: 1.0000 | Freq: Once | ORAL | Status: DC | PRN

## 2012-06-20 MED ORDER — VENLAFAXINE HCL ER 75 MG PO CP24
225.0000 mg | ORAL_CAPSULE | Freq: Every day | ORAL | Status: DC
  Administered 2012-06-20: 225 mg via ORAL
  Filled 2012-06-20: qty 3

## 2012-06-20 MED ORDER — PANTOPRAZOLE SODIUM 40 MG PO TBEC
40.0000 mg | DELAYED_RELEASE_TABLET | Freq: Every day | ORAL | Status: DC
  Administered 2012-06-20 – 2012-06-22 (×3): 40 mg via ORAL
  Filled 2012-06-20 (×3): qty 1

## 2012-06-20 MED ORDER — HYDROCODONE-ACETAMINOPHEN 5-325 MG PO TABS: 1.0000 | ORAL_TABLET | Freq: Four times a day (QID) | ORAL | Status: DC | PRN

## 2012-06-20 MED ORDER — ASPIRIN EC 81 MG PO TBEC
81.0000 mg | DELAYED_RELEASE_TABLET | Freq: Every day | ORAL | Status: DC
  Filled 2012-06-20 (×3): qty 1

## 2012-06-20 MED ORDER — CALCIUM CARBONATE 1250 (500 CA) MG PO TABS
1.0000 | ORAL_TABLET | Freq: Every day | ORAL | Status: DC
  Administered 2012-06-20 – 2012-06-22 (×3): 500 mg via ORAL
  Filled 2012-06-20 (×3): qty 1

## 2012-06-20 MED ORDER — FUROSEMIDE 10 MG/ML IJ SOLN
INTRAMUSCULAR | Status: AC
  Filled 2012-06-20: qty 4

## 2012-06-20 MED ORDER — VENLAFAXINE HCL ER 150 MG PO CP24
150.0000 mg | ORAL_CAPSULE | Freq: Every day | ORAL | Status: DC
  Administered 2012-06-21 – 2012-06-22 (×2): 150 mg via ORAL
  Filled 2012-06-20 (×2): qty 1

## 2012-06-20 NOTE — Progress Notes (Signed)
Lab called critical troponin of 0.43.  Dr. Lavera Guise text/paged at [phone].

## 2012-06-20 NOTE — H&P (Signed)
Priscilla Stevens is an 62 y.o. female.   Chief Complaint: Shortness of breath and tachycardia for one day HPI: A 62 year old female with known history of migraine headaches and fibromyalgia who presented with sudden onset of shortness of breath today. She had an episode of bad yesterday but it went away. She felt like she was going to choke. Brought into the ED where she was found to be more or less struggling to breathe. She was having sinus tachycardia with a rate as high as 150. She received 2 separate doses of adenosine before her heart rate was controlled to about 100-110 per minute. She had no prior cardiac history, no chest pain, no nausea vomiting or diarrhea, no diaphoresis. Patient has had some cough but unable to bring anything up. She has high blood pressure on arrival to the EEG however after initial treatment her blood pressure is currently low at 86/64. She denied fever or chills denied any sick contacts. Her initial findings indicate possible pulmonary edema.  Past Medical History  Diagnosis Date  . Fibromyalgia   . Anxiety   . Pneumonia   . CLL (chronic lymphocytic leukemia)     History reviewed. No pertinent past surgical history.  Family History  Problem Relation Age of Onset  . Congestive Heart Failure Mother    Social History:  reports that she has never smoked. She does not have any smokeless tobacco history on file. She reports that she drinks about 0.6 ounces of alcohol per week. Her drug history is not on file.  Allergies:  Allergies  Allergen Reactions  . Aspirin     REACTION: GI upset     (Not in a hospital admission)  Results for orders placed during the hospital encounter of 06/19/12 (from the past 48 hour(s))  CBC WITH DIFFERENTIAL     Status: Abnormal   Collection Time    06/19/12 11:28 PM      Result Value Range   WBC 27.9 (*) 4.0 - 10.5 K/uL   RBC 5.16 (*) 3.87 - 5.11 MIL/uL   Hemoglobin 14.6  12.0 - 15.0 g/dL   HCT 40.9  81.1 - 91.4 %   MCV  85.3  78.0 - 100.0 fL   MCH 28.3  26.0 - 34.0 pg   MCHC 33.2  30.0 - 36.0 g/dL   RDW 78.2  95.6 - 21.3 %   Platelets 270  150 - 400 K/uL   Neutrophils Relative 59  43 - 77 %   Lymphocytes Relative 31  12 - 46 %   Monocytes Relative 9  3 - 12 %   Eosinophils Relative 1  0 - 5 %   Basophils Relative 0  0 - 1 %   Neutro Abs 16.4 (*) 1.7 - 7.7 K/uL   Lymphs Abs 8.7 (*) 0.7 - 4.0 K/uL   Monocytes Absolute 2.5 (*) 0.1 - 1.0 K/uL   Eosinophils Absolute 0.3  0.0 - 0.7 K/uL   Basophils Absolute 0.0  0.0 - 0.1 K/uL   WBC Morphology ATYPICAL LYMPHOCYTES     Smear Review LARGE PLATELETS PRESENT     Comment: PLATELETS APPEAR ADEQUATE  BASIC METABOLIC PANEL     Status: Abnormal   Collection Time    06/19/12 11:28 PM      Result Value Range   Sodium 139  135 - 145 mEq/L   Potassium 4.2  3.5 - 5.1 mEq/L   Chloride 102  96 - 112 mEq/L   CO2 24  19 -  32 mEq/L   Glucose, Bld 227 (*) 70 - 99 mg/dL   BUN 13  6 - 23 mg/dL   Creatinine, Ser 1.61  0.50 - 1.10 mg/dL   Calcium 9.1  8.4 - 09.6 mg/dL   GFR calc non Af Amer 54 (*) >90 mL/min   GFR calc Af Amer 63 (*) >90 mL/min   Comment:            The eGFR has been calculated     using the CKD EPI equation.     This calculation has not been     validated in all clinical     situations.     eGFR's persistently     <90 mL/min signify     possible Chronic Kidney Disease.  PRO B NATRIURETIC PEPTIDE     Status: Abnormal   Collection Time    06/19/12 11:28 PM      Result Value Range   Pro B Natriuretic peptide (BNP) 1050.0 (*) 0 - 125 pg/mL  TROPONIN I     Status: None   Collection Time    06/19/12 11:28 PM      Result Value Range   Troponin I <0.30  <0.30 ng/mL   Comment:            Due to the release kinetics of cTnI,     a negative result within the first hours     of the onset of symptoms does not rule out     myocardial infarction with certainty.     If myocardial infarction is still suspected,     repeat the test at appropriate  intervals.  URINALYSIS, MICROSCOPIC ONLY     Status: Abnormal   Collection Time    06/20/12 12:17 AM      Result Value Range   Color, Urine YELLOW  YELLOW   APPearance CLEAR  CLEAR   Specific Gravity, Urine 1.017  1.005 - 1.030   pH 5.0  5.0 - 8.0   Glucose, UA NEGATIVE  NEGATIVE mg/dL   Hgb urine dipstick TRACE (*) NEGATIVE   Bilirubin Urine NEGATIVE  NEGATIVE   Ketones, ur NEGATIVE  NEGATIVE mg/dL   Protein, ur 30 (*) NEGATIVE mg/dL   Urobilinogen, UA 0.2  0.0 - 1.0 mg/dL   Nitrite NEGATIVE  NEGATIVE   Leukocytes, UA NEGATIVE  NEGATIVE   WBC, UA 0-2  <3 WBC/hpf   RBC / HPF 3-6  <3 RBC/hpf   Bacteria, UA RARE  RARE   Squamous Epithelial / LPF RARE  RARE   Casts HYALINE CASTS (*) NEGATIVE   Dg Chest Portable 1 View  06/20/2012  *RADIOLOGY REPORT*  Clinical Data: Severe shortness of breath.  Pulmonary edema. Cardiomegaly.  PORTABLE CHEST - 1 VIEW  Comparison: 12/27/2008  Findings: The shallow inspiration.  Cardiac enlargement with mild prominence of pulmonary vascularity.  Diffuse interstitial changes suggesting interstitial edema.  Changes are new since previous study.  No blunting of costophrenic angles.  No pneumothorax.  No focal airspace disease.  IMPRESSION: Cardiac enlargement with pulmonary vascular congestion and interstitial edema.   Original Report Authenticated By: Burman Nieves, M.D.     Review of Systems  Constitutional: Negative.   HENT: Negative.   Eyes: Negative.   Respiratory: Positive for cough and shortness of breath. Negative for hemoptysis, sputum production and wheezing.   Cardiovascular: Positive for palpitations and orthopnea. Negative for chest pain, claudication, leg swelling and PND.  Gastrointestinal: Negative.   Genitourinary: Negative.   Musculoskeletal:  Negative.   Skin: Negative.   Neurological: Negative.   Endo/Heme/Allergies: Negative.   Psychiatric/Behavioral: The patient is nervous/anxious.     Blood pressure 86/64, pulse 112, resp. rate  26, SpO2 95.00%. Physical Exam  Constitutional: She is oriented to person, place, and time. She appears well-developed and well-nourished.  HENT:  Head: Normocephalic and atraumatic.  Eyes: EOM are normal. Pupils are equal, round, and reactive to light.  Neck: Normal range of motion. Neck supple. No thyromegaly present.  Cardiovascular: Regular rhythm and intact distal pulses.  Tachycardia present.  PMI is not displaced.   No murmur heard. Respiratory: She is in respiratory distress.  GI: Soft. Bowel sounds are normal.  Neurological: She is alert and oriented to person, place, and time. She has normal reflexes.  Skin: Skin is warm and dry.  Psychiatric: She has a normal mood and affect. Her behavior is normal. Thought content normal.     Assessment/Plan A 62 year old female presenting with acute respiratory distress probably flash pulmonary edema but cannot rule out other causes.  #1 acute respiratory distress: The differentials include pulmonary edema secondary to flash edema from high blood pressure versus pulmonary embolism, no wheezing to suggest COPD or asthma. Patient is not a smoker. No evidence of pneumonia although she has leukocytosis. It could be due to dehydration however her blood pressure on arrival was actually elevated. We will keep her on oxygen. Check CT angiogram of the chest to rule out PE, given albuterol and oh Atrovent nebulizers if needed, and observe her overnight. She has received diuretics in the ER and we may repeat it if her blood pressure allows. We will get 2-D echocardiogram, follow her BNP which is mildly elevated today at about 1000. She is however overweight so the pro-BNP may not be all that accurate.  #2 pulmonary edema: Again this could be secondary to flash pulmonary edema. She has received a dose of Lasix in the ER and we will may repeat it again if she continues to be short of breath. We'll get 2-D echocardiogram.  #3 leukocytosis: Patient reported  having history of CLL many years ago. She was in remission and has been off treatment. We will follow WBC levels, consider pathologist review of smear and possibly hematology referral.  #4 sinus tachycardia: Probably second to respiratory distress, could also be due to pulmonary edema, intrinsic cardiac disease, or could be due to pulmonary embolism. We will hydrate the patient, get a CT angiogram of the chest to rule out PE, has already received adenosine x2, I will keep her on telemetry.  #5 GERD: PPI while in the hospital.  #6 prophylaxis: Patient will be on Lovenox for DVT prophylaxis.  GARBA,LAWAL 06/20/2012, 2:43 AM

## 2012-06-20 NOTE — Progress Notes (Signed)
Chart review complete.  Patient is not eligible for [facility] Care Management services because her PCP is not a [facility] primary care provider or is not [facility] affiliated.  For any additional questions or new referrals please contact Tim Henderson BSN RN [facility] Liaison at [phone] °

## 2012-06-20 NOTE — Progress Notes (Addendum)
[facility] PROGRESS NOTE  Priscilla Stevens WUJ:[id number] DOB: 07/20/1950 DOA: 06/19/2012 PCP: [name], MD  Assessment/Plan: 1. Acute respiratory failure - secondary to acute congestive heart failure. Improved after IV furosemide, oxygen therapy. Did not require BiPAP. 2. New onset CHF - patient has never had hypertension, ischemic heart disease. She denies an acute viral illness recently. await echocardiogram results. Patient was given IV furosemide on admission, low dose beta blocker to control the tachycardia. TSH pending  3.    Leukocytosis with hx of CLL - improved-suspect some of this was demargination. 4. positive troponin without chest pain - we'll obtain stat EKG. The patient is intolerant to aspirin so we will start Plavix 75 mg daily. Obtain cardiology consultation with [facility]. Patient reports a normal cardiac cath in 2010.  5. tachycardia in the emergency room-presumed SVT-treated with adenosine unsuccessfully. In retrospect probably patient had sinus tachycardia. Started beta blocker on March 17  6. Depression and anxiety - continue home medications. Since patient has significant tachycardia I am going to decrease her Effexor dose to 150 daily.   Code Status: full code  Family Communication: husband (indicate person spoken with, relationship, and if by phone, the number) Disposition Plan: home    Consultants:  [facility]   Procedures:  Echo   HPI/Subjective: Feels better   Objective: Filed Vitals:   06/20/12 0215 06/20/12 0230 06/20/12 0315 06/20/12 0346  BP: 86/64 103/49 106/61 127/83  Pulse: 112 103 100 105  Temp:    98.2 F (36.8 C)  TempSrc:    Oral  Resp: 26 18 19 22   Height:    5\' 5"  (1.651 m)  Weight:    99.973 kg (220 lb 6.4 oz)  SpO2: 95% 95% 90% 95%   Patient Vitals for the past 24 hrs:  BP Temp Temp src Pulse Resp SpO2 Height Weight  06/20/12 0346 127/83 mmHg 98.2 F (36.8 C) Oral 105 22 95 % 5\' 5"  (1.651 m) 99.973 kg  (220 lb 6.4 oz)  06/20/12 0315 106/61 mmHg - - 100 19 90 % - -  06/20/12 0230 103/49 mmHg - - 103 18 95 % - -  06/20/12 0215 86/64 mmHg - - 112 26 95 % - -  06/20/12 0200 97/47 mmHg - - 110 23 91 % - -  06/20/12 0145 105/62 mmHg - - 114 24 95 % - -  06/20/12 0130 106/62 mmHg - - 116 22 95 % - -  06/20/12 0030 98/75 mmHg - - 113 20 98 % - -  06/20/12 0025 101/28 mmHg - - 113 24 99 % - -  06/20/12 0020 108/31 mmHg - - 113 22 99 % - -  06/20/12 0015 96/62 mmHg - - 117 25 99 % - -  06/20/12 0001 132/60 mmHg - - 140 25 98 % - -  06/19/12 2345 138/64 mmHg - - 147 26 99 % - -  06/19/12 2330 197/119 mmHg - - - - - - -  06/19/12 2329 149/108 mmHg - - - - 99 % - -  06/19/12 2326 - - - - - 93 % - -  06/19/12 2319 - - - - - 93 % - -     Intake/Output Summary (Last 24 hours) at 06/20/12 1004 Last data filed at 06/20/12 0826  Gross per 24 hour  Intake  420.5 ml  Output   2300 ml  Net -1879.5 ml   Filed Weights   06/20/12 0346  Weight: 99.973 kg (220 lb  6.4 oz)    Exam:   General:  axox3  Cardiovascular: Tachycardic regular without murmurs  Respiratory: By basilar crackles  Abdomen: Soft nontender  Musculoskeletal: Intact   Data Reviewed: Basic Metabolic Panel:  Recent Labs Lab 06/19/12 2328 06/20/12 0304  NA 139 137  K 4.2 4.0  CL 102 101  CO2 24 27  GLUCOSE 227* 125*  BUN 13 14  CREATININE 1.09 1.09  CALCIUM 9.1 9.0   Liver Function Tests:  Recent Labs Lab 06/20/12 0304  AST 20  ALT 13  ALKPHOS 88  BILITOT 0.3  PROT 6.8  ALBUMIN 3.4*   No results found for this basename: LIPASE, AMYLASE,  in the last 168 hours No results found for this basename: AMMONIA,  in the last 168 hours CBC:  Recent Labs Lab 06/19/12 2328 06/20/12 0304  WBC 27.9* 16.8*  NEUTROABS 16.4*  --   HGB 14.6 12.8  HCT 44.0 38.3  MCV 85.3 82.5  PLT 270 198   Cardiac Enzymes:  Recent Labs Lab 06/19/12 2328 06/20/12 0304 06/20/12 0912  TROPONINI <0.30 <0.30 0.43*   BNP  (last 3 results)  Recent Labs  06/19/12 2328  PROBNP 1050.0*   CBG: No results found for this basename: GLUCAP,  in the last 168 hours  No results found for this or any previous visit (from the past 240 hour(s)).   Studies: Ct Angio Chest W/cm &/or Wo Cm  06/20/2012  *RADIOLOGY REPORT*  Clinical Data: Sudden onset of shortness of breath.  CT ANGIOGRAPHY CHEST  Technique:  Multidetector CT imaging of the chest using the standard protocol during bolus administration of intravenous contrast. Multiplanar reconstructed images including MIPs were obtained and reviewed to evaluate the vascular anatomy.  Contrast: OMNIPAQUE IOHEXOL 350 MG/ML SOLN  Comparison: None.  Findings: Technically adequate study with good opacification of the central and segmental pulmonary arteries.  No focal filling defects demonstrated.  No evidence of significant pulmonary embolus.  Mild cardiac enlargement with calcification in the region of the mitral valve.  Reflux of contrast material into the inferior vena cava suggest passive congestion.  Upper abdominal organs are otherwise unremarkable.  Enlarged lymph nodes are present in the aortopulmonic window region at 13 mm diameter and in the subcarinal region at 14 mm diameter.  Right paratracheal lymph nodes measure up to 16 mm diameter.  Pretracheal lymph nodes measure up to 17 mm diameter.  No axillary lymphadenopathy.  Esophagus is decompressed.  Small bilateral pleural effusions with basilar atelectasis.  Patchy air space disease in the lungs suggests perihilar edema.  No pneumothorax.  Airways appear patent.  Degenerative changes in the thoracic spine.  IMPRESSION: No evidence of significant pulmonary embolus.  Cardiac enlargement, bilateral pleural effusions, and perihilar airspace disease suggesting congestive failure with edema.  Lymphadenopathy in the mediastinum is of nonspecific etiology.  Consider reactive versus neoplastic lymph nodes.   Original Report  Authenticated By: Burman Nieves, M.D.    Dg Chest Portable 1 View  06/20/2012  *RADIOLOGY REPORT*  Clinical Data: Severe shortness of breath.  Pulmonary edema. Cardiomegaly.  PORTABLE CHEST - 1 VIEW  Comparison: 12/27/2008  Findings: The shallow inspiration.  Cardiac enlargement with mild prominence of pulmonary vascularity.  Diffuse interstitial changes suggesting interstitial edema.  Changes are new since previous study.  No blunting of costophrenic angles.  No pneumothorax.  No focal airspace disease.  IMPRESSION: Cardiac enlargement with pulmonary vascular congestion and interstitial edema.   Original Report Authenticated By: Burman Nieves, M.D.  Scheduled Meds: . antiseptic oral rinse  15 mL Mouth Rinse BID  . ARIPiprazole  7.5 mg Oral Daily  . atorvastatin  10 mg Oral Daily  . calcium carbonate  1 tablet Oral Daily  . [START ON 06/21/2012] clopidogrel  75 mg Oral Q breakfast  . docusate sodium  100 mg Oral BID  . enoxaparin (LOVENOX) injection  40 mg Subcutaneous Q24H  . furosemide      . furosemide  40 mg Intravenous BID  . metoprolol tartrate  12.5 mg Oral BID  . pantoprazole  40 mg Oral Daily  . sodium chloride  3 mL Intravenous Q12H  . traZODone  100 mg Oral QHS  . venlafaxine XR  225 mg Oral Daily   Continuous Infusions:    Principal Problem:   CHF, acute Active Problems:   GERD   Respiratory distress   Pulmonary edema   Leucocytosis   Morbid obesity   Sinus tachycardia   Elevated troponin      Jessilyn Catino  [facility] Pager [phone]. If 7PM-7AM, please contact night-coverage at [email or web address], password [facility] 06/20/2012, 10:04 AM  LOS: 1 day

## 2012-06-20 NOTE — Progress Notes (Signed)
Utilization review completed.  

## 2012-06-20 NOTE — Progress Notes (Signed)
Cath scheduled for tomorrow. We are working on trying to get TEE arranged but this is not yet finalized for Wed. Will discontinue Plavix as ordered by primary team in case surgical intervention is warranted after evaluation of mitral valve. She reports h/o stomach ache with ASA in the past but no true allergic symptoms. Remote history of diverticulitis but no recent bleeding symptoms. Will try low dose ASA and continue PPI. Agree with Effexor dose decrease for her tachycardia. Dayna Dunn PA-C

## 2012-06-20 NOTE — Progress Notes (Signed)
Orders were put in to run normal saline at 150 ml/hr.  The [facility] hospitalist was contacted concerning these orders.  New orders were given to run normal saline at 75 ml/hr due to a concern of dehydration.  The patient's skin was slightly tenting at this time.  The RN will carry out the orders and continue to monitor the patient.

## 2012-06-20 NOTE — ED Notes (Signed)
Patient transported to CT 

## 2012-06-20 NOTE — Progress Notes (Signed)
  Echocardiogram 2D Echocardiogram has been performed.  Priscilla Stevens [name] 06/20/2012, 12:28 PM

## 2012-06-20 NOTE — Consult Note (Signed)
CARDIOLOGY CONSULT NOTE  Patient ID: Priscilla Stevens, MRN: [id number], DOB/AGE: 60-Sep-1952 60 y.o. Admit date: 06/19/2012   Date of Consult: 06/20/2012 Primary Physician: [name], MD Primary Cardiologist: new to . Per pt, there was a disagreement between her and previous [facility] provider regarding further testing.  Chief Complaint: SOB Reason for Consult: CHF, MS/MR  HPI:Priscilla Stevens is a 62 y/o F with history of CLL, 25-yrs former tobacco abuse, diastolic dysfunction by cath 2010 (false + nuc with normal cors at that time) who presented to [facility] with tachycardia and SOB. In general since late fall she has been experiencing worsening exertional dyspnea. She has also had episodes of abruptly starting/stopping dyspnea 1-2x/week with possible sensation of heart racing relieved by drinking something hot. It is triggered by cold weather but can also occur at rest and in the middle of the night. This can last hours at a time. Yesterday, she had worsening SOB even at rest "like I was breathing through a straw" "like I had asthma." She called EMS and HR was elevated she received 2 doses of adenosine but upon arrival to ED was still tachycardic with HR 160. 2 more doses gave no effect thus IV metoprolol was given, slowing HR to 110s/sinus per report. BP was up to 197/119 on arrival - her BP typically runs chronically low per pt. CTA neg for PE but did show cardiac enlargement, bilateral pleural effusions, and perihilar airspace disease suggesting congestive failure with edema, and nonspecific mediastinal lymphadenopathy. Troponin positive only on 3rd set at 0.43. WBC 27k->16.8, TSH WNL, pBNP 1050, glucose elevated. She denies any CP, LEE, syncope, significant weight change, fever, chills. She had an episode of bronchitis in December. She received 40mg  IV Lasix in the ED and 20mg  this morning, and started on low-dose PO metoprolol. She states she currently feels much better. -2L thus  far. 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  Past Medical History  Diagnosis Date  . Fibromyalgia   . Anxiety   . Pneumonia   . CLL (chronic lymphocytic leukemia)   . H/O cardiac catheterization     2010: done for false + nuc. No CAD. +Diastolic dysfunction.  . Cough     Eval by pulm in past: essentially normal PFTs, cough ddx was variant asthma, upper airway cough syndrome (previously termed post nasal drip syndrome) or GERD,   . Diverticulitis       Most Recent Cardiac Studies: 2D Echo 3/2014Study Conclusions - Left ventricle: The cavity size was normal. Wall thickness was normal. Systolic function was normal. The estimated ejection fraction was in the range of 55% to 60%. Regional wall motion abnormalities cannot be excluded. - Aortic valve: Trivial regurgitation. - Mitral valve: Calcified annulus. Moderate to severe regurgitation. Valve area by pressure half-time: 1.81cm^2. Valve area by continuity equation (using LVOT flow): 0.67cm^2. 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  Cardiac Cath 01/2009 PROCEDURES: 1. Left heart catheterization. 2. Selective coronary angiography. 3. Left ventriculogram. INDICATIONS:  A 62 year old female with abnormal nuclear stress test showing anterolateral wall ischemia with recent chest heaviness and T- wave inversion noted in lead II, V2. PROCEDURE DETAILS:  Informed consent was obtained.  Risk of stroke, heart attack,  death, renal impairment, bleeding, arterial damage were explained to the patient at length.  Visualization of the femoral head was obtained via fluoroscopy.  A 1% lidocaine used for local anesthesia. Using the modified Seldinger technique, a 4-French sheath  was placed into the right femoral artery.  A Judkins left #4 catheter and a No Torque Williams right catheter was used to selectively cannulate the coronary arteries.  Multiple views with hand injection of Omnipaque were obtained and angled pigtail was then used across the left ventricle. Hemodynamics obtained.  In the RAO position, the left ventriculogram using power injection was performed.  Hemodynamics obtained in the ascending aorta.  Following procedure, catheters were removed, sheath was removed.  She was hemodynamically stable, tolerated the procedure well. FINDINGS: 1. Left main artery branches into LAD and circumflex artery.  There is  no angiographically significant coronary artery disease. 2. Left anterior descending artery has one moderate-sized branching diagonal.  The mid-to-distal vessel is quite small in caliber.     Nitroglycerin was administered 200 mcg, which did help slightly  expand the caliber of this vessel.  There was no significant angiographic disease. 3. Circumflex artery - there were 2 obtuse marginal branches.  There was no angiographically significant coronary artery disease. 4. Right coronary artery - dominant vessel giving rise to the PDA.  This was a much larger caliber vessel compared to the other system with no angiographically significant artery disease present.  This did have a quite a significant posterolateral branch supplying a  majority of the inferolateral wall. 5. Left ventriculogram - normal left ventricular ejection fraction  with no wall motion abnormalities, no mitral regurgitation, no aortic stenosis by hemodynamics. Left ventricular hemodynamics:  Systolic pressure 123, end-diastolicdressure 20.  Aortic pressure 123/78 with a mean of 98 mmHg. IMPRESSIONS: 1. No angiographically significant coronary artery disease,reassuring, false positive nuclear stress test. 2. Normal ventricular ejection fraction of 65% with no wall motion abnormalities. 3. No aortic  stenosis or mitral regurgitation present. 4. Elevated left ventricular end-diastolic pressure, consistent with diastolic dysfunction. PLAN:  Continue to treat risk factors aggressively including blood pressure.  I will see her back in clinic in 2 weeks for followup. Reassuring cardiac catheterization.  Findings were discussed with both the patient and her family. Jake Bathe, MD   Surgical History: History reviewed. No pertinent past surgical history.   Home Meds: Prior to Admission medications   Medication Sig Start Date End Date Taking? Authorizing Provider  ARIPiprazole (ABILIFY) 5 MG tablet Take 7.5 mg by mouth daily.   Yes Historical Provider, MD  atorvastatin (LIPITOR) 10 MG tablet Take 10 mg by mouth daily.   Yes Historical Provider, MD  calcium carbonate (OS-CAL) 600 MG TABS Take 600 mg by mouth daily.   Yes Historical Provider, MD  HYDROcodone-acetaminophen (NORCO/VICODIN) 5-325 MG per tablet Take 1 tablet by mouth every 6 (six) hours as needed for pain.   Yes Historical Provider, MD  Lansoprazole (PREVACID PO) Take 1 tablet by mouth daily. OTC   Yes Historical Provider, MD  traMADol (ULTRAM) 50 MG tablet Take 50 mg by mouth every 8 (eight) hours as needed for pain.   Yes Historical Provider, MD  traZODone (DESYREL) 100 MG tablet Take 100 mg by mouth at bedtime.   Yes Historical Provider, MD  Venlafaxine HCl 225 MG TB24 Take 1 tablet by mouth daily.   Yes Historical Provider, MD    Inpatient Medications:  . antiseptic oral rinse  15 mL Mouth Rinse BID  .  ARIPiprazole  7.5 mg Oral Daily  . atorvastatin  10 mg Oral Daily  . calcium carbonate  1 tablet Oral Daily  . [START ON 06/21/2012] clopidogrel  75 mg Oral Q breakfast  . docusate sodium  100 mg Oral BID  . enoxaparin (LOVENOX) injection  40 mg Subcutaneous Q24H  . furosemide      . furosemide  40 mg Intravenous BID  . metoprolol tartrate  12.5 mg Oral BID  . pantoprazole  40 mg Oral Daily  . sodium chloride  3 mL  Intravenous Q12H  . traZODone  100 mg Oral QHS  . [START ON 06/21/2012] venlafaxine XR  150 mg Oral Daily    Allergies:  Allergies  Allergen Reactions  . Aspirin     REACTION: GI upset    History   Social History  . Marital Status: Married    Spouse Name: N/A    Number of Children: N/A  . Years of Education: N/A   Occupational History  . Not on file.   Social History Main Topics  . Smoking status: Former Games developer  . Smokeless tobacco: Not on file     Comment: Quit January 2014. Smoked for 25 yrs.  . Alcohol Use: 0.0 oz/week     Comment: 2x/year  . Drug Use: No  . Sexually Active: Not on file   Other Topics Concern  . Not on file   Social History Narrative  . No narrative on file     Family History  Problem Relation Age of Onset  . Congestive Heart Failure Mother      Review of Systems: General: negative for chills, fever, night sweats or weight changes. She gained weight after quitting smoking, but has been working to lose it again. Cardiovascular: see above Dermatological: negative for rash Respiratory: negative for cough Urologic: negative for hematuria Abdominal: negative for vomiting, diarrhea, bright red blood per rectum, melena, or hematemesis. Has had some nausea. Neurologic: negative for visual changes, syncope, or dizziness All other systems reviewed and are otherwise negative except as noted above.  Labs:  Recent Labs  06/19/12 2328 06/20/12 0304 06/20/12 0912  TROPONINI <0.30 <0.30 0.43*   Lab Results  Component Value Date   WBC 16.8* 06/20/2012   HGB 12.8 06/20/2012   HCT 38.3 06/20/2012   MCV 82.5 06/20/2012   PLT 198 06/20/2012     Recent Labs Lab 06/20/12 0304  NA 137  K 4.0  CL 101  CO2 27  BUN 14  CREATININE 1.09  CALCIUM 9.0  PROT 6.8  BILITOT 0.3  ALKPHOS 88  ALT 13  AST 20  GLUCOSE 125*   Radiology/Studies:  Ct Angio Chest W/cm &/or Wo Cm 06/20/2012  *RADIOLOGY REPORT*  Clinical Data: Sudden onset of shortness of  breath.  CT ANGIOGRAPHY CHEST  Technique:  Multidetector CT imaging of the chest using the standard protocol during bolus administration of intravenous contrast. Multiplanar reconstructed images including MIPs were obtained and reviewed to evaluate the vascular anatomy.  Contrast: OMNIPAQUE IOHEXOL 350 MG/ML SOLN  Comparison: None.  Findings: Technically adequate study with good opacification of the central and segmental pulmonary arteries.  No focal filling defects demonstrated.  No evidence of significant pulmonary embolus.  Mild cardiac enlargement with calcification in the region of the mitral valve.  Reflux of contrast material into the inferior vena cava suggest passive congestion.  Upper abdominal organs are otherwise unremarkable.  Enlarged lymph nodes are present in the aortopulmonic window region at 13 mm  diameter and in the subcarinal region at 14 mm diameter.  Right paratracheal lymph nodes measure up to 16 mm diameter.  Pretracheal lymph nodes measure up to 17 mm diameter.  No axillary lymphadenopathy.  Esophagus is decompressed.  Small bilateral pleural effusions with basilar atelectasis.  Patchy air space disease in the lungs suggests perihilar edema.  No pneumothorax.  Airways appear patent.  Degenerative changes in the thoracic spine.  IMPRESSION: No evidence of significant pulmonary embolus.  Cardiac enlargement, bilateral pleural effusions, and perihilar airspace disease suggesting congestive failure with edema.  Lymphadenopathy in the mediastinum is of nonspecific etiology.  Consider reactive versus neoplastic lymph nodes.   Original Report Authenticated By: Burman Nieves, M.D.    Dg Chest Portable 1 View 06/20/2012  *RADIOLOGY REPORT*  Clinical Data: Severe shortness of breath.  Pulmonary edema. Cardiomegaly.  PORTABLE CHEST - 1 VIEW  Comparison: 12/27/2008  Findings: The shallow inspiration.  Cardiac enlargement with mild prominence of pulmonary vascularity.  Diffuse interstitial  changes suggesting interstitial edema.  Changes are new since previous study.  No blunting of costophrenic angles.  No pneumothorax.  No focal airspace disease.  IMPRESSION: Cardiac enlargement with pulmonary vascular congestion and interstitial edema.   Original Report Authenticated By: Burman Nieves, M.D.    EKG: NSR 93bpm, QTc 494, nonspecific ST-T changes Initial EKG: regular narrow complex tachycardia 160bpm no acute ST-T changes ?sinus tach versus atrial flutter  Physical Exam: Blood pressure 127/83, pulse 105, temperature 98.2 F (36.8 C), temperature source Oral, resp. rate 22, height 5\' 5"  (1.651 m), weight 220 lb 6.4 oz (99.973 kg), SpO2 95.00%. General: Well developed obese WF in no acute distress. Head: Normocephalic, atraumatic, sclera non-icteric, no xanthomas, nares are without discharge.  Neck: Negative for carotid bruits. JVD not elevated. Lungs: Faint crackles at bases. Moderate air movement. Diffuse quiet end expiratory wheezes. Breathing is unlabored. Heart: RRR with S1 S2. No significant murmurs, rubs, or gallops appreciated. Abdomen: Soft, non-tender, non-distended with normoactive bowel sounds. No hepatomegaly. No rebound/guarding. No obvious abdominal masses. Msk:  Strength and tone appear normal for age. Extremities: No clubbing or cyanosis. No edema.  Distal pedal pulses are 2+ and equal bilaterally. Neuro: Alert and oriented X 3. No facial asymmetry. No focal deficit. Moves all extremities spontaneously. Psych:  Responds to questions appropriately with a normal affect.   Assessment and Plan:   1. Acute diastolic CHF - preserved LV function but mod-severe MR now present (did not have in 2010)/MS. She was quite hypertensive on admission but with subsequent low blood pressure where she states she runs chronically. Per discussion with Dr. Eden Emms, continue IV Lasix BID and metoprolol. Will plan R&LHC tomorrow and TEE the following day (chosen in this order due to  schedule) to further evaluate valve as well as coronaries. 2. 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 3. Normal coronaries 2010 - suspect elevated troponin due to demand ischemia. Add low dose baby ASA (h/o GI intolerance - "stomach ache" in the past, not true allergy). Would hold off on full dose anticoag for now. Continue BB. Check lipids.  4. Tachycardia - Initial EKG rhythm is ?atrial flutter versus sinus tach. Pt reports subjective sense of abrupt SOB episodes over the last few months which could be related to arrhythmia. She is NSR on telemetry. Continue BB. See Dr. Fabio Bering thoughts. Will switch albuterol to xopenex (still having some mild wheezing, unclear if related to residual edema). 5. Hyperglycemia - check A1C. 6. H/o CLL with  leukocytosis this admission - will defer to IM. Followed q53mo with heme-onc per pt. 7. Mediastinal lymphadenopathy on CT this admission, question reactive vs neoplastic - she will need to f/u heme-onc to decide if further surveillance is needed.  Signed, Ronie Spies PA-C 06/20/2012, 1:47 PM   Patient examined chart reviewed. Echo looked at with PA  Mixed MV disease.  Admitting rhythm appeared to be flutter at rate 160  Significant pulmonary edema likely from poor diastolic filling time MV disease likely cause of edema.  Severe MAC with at least moderate MR and moderate function MS.  On beta blocker and diuretic.  Will need right and left heart cath and subsequent TEE to make decision about MV surgery. Patient and daughters in agreement.    Charlton Haws

## 2012-06-20 NOTE — Progress Notes (Signed)
The patient arrived to 73.  The patient was placed on telemetry and oriented to the unit.  VS were taken and the patient was assessed.  The admission history was completed.  The patient didn't have any complaints of pain at this time.  The bed alarm is on and the call bell is within reach.  The RN will carry out the orders and continue to monitor the patient.

## 2012-06-20 NOTE — ED Notes (Signed)
Called RT to d/c BiPap per EDP Preston Fleeting.

## 2012-06-21 ENCOUNTER — Encounter (HOSPITAL_COMMUNITY)
Admission: EM | Disposition: A | Discharge status: Home Health | Payer: Self-pay | Source: Home / Self Care | Attending: Internal Medicine

## 2012-06-21 ENCOUNTER — Other Ambulatory Visit: Payer: Self-pay

## 2012-06-21 DIAGNOSIS — I059 Rheumatic mitral valve disease, unspecified: Secondary | ICD-10-CM

## 2012-06-21 DIAGNOSIS — C9110 Chronic lymphocytic leukemia of B-cell type not having achieved remission: Secondary | ICD-10-CM

## 2012-06-21 DIAGNOSIS — I2510 Atherosclerotic heart disease of native coronary artery without angina pectoris: Secondary | ICD-10-CM

## 2012-06-21 DIAGNOSIS — R05 Cough: Secondary | ICD-10-CM

## 2012-06-21 DIAGNOSIS — I509 Heart failure, unspecified: Secondary | ICD-10-CM

## 2012-06-21 HISTORY — PX: LEFT AND RIGHT HEART CATHETERIZATION WITH CORONARY ANGIOGRAM: SHX5449

## 2012-06-21 LAB — POCT I-STAT 3, ART BLOOD GAS (G3+)
Bicarbonate: 30.3 mEq/L — ABNORMAL HIGH (ref 20.0–24.0)
O2 Saturation: 96 %
TCO2: 32 mmol/L (ref 0–100)
pCO2 arterial: 49.1 mmHg — ABNORMAL HIGH (ref 35.0–45.0)

## 2012-06-21 LAB — BASIC METABOLIC PANEL
CO2: 31 mEq/L (ref 19–32)
Glucose, Bld: 115 mg/dL — ABNORMAL HIGH (ref 70–99)
Potassium: 4.1 mEq/L (ref 3.5–5.1)
Sodium: 143 mEq/L (ref 135–145)

## 2012-06-21 LAB — CBC
HCT: 38.3 % (ref 36.0–46.0)
Hemoglobin: 12.3 g/dL (ref 12.0–15.0)
MCH: 27.8 pg (ref 26.0–34.0)
MCHC: 32.9 g/dL (ref 30.0–36.0)
MCV: 85.1 fL (ref 78.0–100.0)
MCV: 84.9 fL (ref 78.0–100.0)
RBC: 4.43 MIL/uL (ref 3.87–5.11)
RDW: 14.7 % (ref 11.5–15.5)

## 2012-06-21 LAB — CREATININE, SERUM: GFR calc Af Amer: 73 mL/min — ABNORMAL LOW (ref >90)

## 2012-06-21 LAB — PROTIME-INR: Prothrombin Time: 12.8 seconds (ref 11.6–15.2)

## 2012-06-21 LAB — URINE CULTURE
Colony Count: NO GROWTH
Culture: NO GROWTH

## 2012-06-21 LAB — POCT I-STAT 3, VENOUS BLOOD GAS (G3P V)
Acid-Base Excess: 5 mmol/L — ABNORMAL HIGH (ref 0.0–2.0)
Bicarbonate: 31.4 mEq/L — ABNORMAL HIGH (ref 20.0–24.0)
O2 Saturation: 57 %
pO2, Ven: 32 mmHg (ref 30.0–45.0)

## 2012-06-21 LAB — LIPID PANEL
Cholesterol: 133 mg/dL (ref 0–200)
HDL: 51 mg/dL (ref >39)
Total CHOL/HDL Ratio: 2.6 RATIO
VLDL: 18 mg/dL (ref 0–40)

## 2012-06-21 SURGERY — LEFT AND RIGHT HEART CATHETERIZATION WITH CORONARY ANGIOGRAM
Anesthesia: LOCAL

## 2012-06-21 MED ORDER — SODIUM CHLORIDE 0.9 % IJ SOLN
3.0000 mL | Freq: Two times a day (BID) | INTRAMUSCULAR | Status: DC
  Administered 2012-06-22: 3 mL via INTRAVENOUS

## 2012-06-21 MED ORDER — SODIUM CHLORIDE 0.9 % IJ SOLN: 3.0000 mL | INTRAMUSCULAR | Status: DC | PRN

## 2012-06-21 MED ORDER — LIDOCAINE HCL (PF) 1 % IJ SOLN
INTRAMUSCULAR | Status: AC
  Filled 2012-06-21: qty 30

## 2012-06-21 MED ORDER — FENTANYL CITRATE 0.05 MG/ML IJ SOLN
INTRAMUSCULAR | Status: AC
  Filled 2012-06-21: qty 2

## 2012-06-21 MED ORDER — MIDAZOLAM HCL 2 MG/2ML IJ SOLN
INTRAMUSCULAR | Status: AC
  Filled 2012-06-21: qty 2

## 2012-06-21 MED ORDER — HEPARIN (PORCINE) IN NACL 2-0.9 UNIT/ML-% IJ SOLN
INTRAMUSCULAR | Status: AC
  Filled 2012-06-21: qty 1000

## 2012-06-21 MED ORDER — SODIUM CHLORIDE 0.9 % IV SOLN
INTRAVENOUS | Status: DC
  Administered 2012-06-21: 10:00:00 via INTRAVENOUS

## 2012-06-21 MED ORDER — ONDANSETRON HCL 4 MG/2ML IJ SOLN: 4.0000 mg | Freq: Four times a day (QID) | INTRAMUSCULAR | Status: DC | PRN

## 2012-06-21 MED ORDER — ACETAMINOPHEN 325 MG PO TABS: 650.0000 mg | ORAL_TABLET | ORAL | Status: DC | PRN

## 2012-06-21 MED ORDER — SODIUM CHLORIDE 0.9 % IV SOLN: 250.0000 mL | INTRAVENOUS | Status: DC | PRN

## 2012-06-21 NOTE — Progress Notes (Signed)
[facility] PROGRESS NOTE  Priscilla Stevens WUJ:[id number] DOB: June 10, 1950 DOA: 06/19/2012 PCP: [name], MD  Assessment/Plan: 1. Acute respiratory failure - secondary to acute congestive heart failure. Improved after IV furosemide, oxygen therapy. Did not require BiPAP. New onset CHF secondary to mitral valve disease  - patient has never had hypertension, ischemic heart disease. She denies an acute viral illness recently. Echocardiogram with significant mitral regurgitation . Patient was given IV furosemide on admission, low dose beta blocker to control the tachycardia. TSH 1.812 Patient to undergo TEE and have TCTS consult.  3.    Leukocytosis with hx of CLL - improved-suspect some of this was demargination as it resolved by 06/21/12 - no further w/u.  4. positive troponin without chest pain - probably secondary to decompensated CHF. LHC 06/21/12 without CAD.    5. tachycardia in the emergency room-presumed SVT-treated with adenosine unsuccessfully. In retrospect probably patient had atrial flutter . Started beta blocker on March 17  6. Depression and anxiety - continue home medications. Since patient has significant tachycardia I am going to decrease her Effexor dose to 150 daily.   Code Status: full code  Family Communication: husband  Disposition Plan: home    Consultants:  [facility]   Procedures:  Echo  - Left ventricle: The cavity size was normal. Wall thickness was normal. Systolic function was normal. The estimated ejection fraction was in the range of 55% to 60%. Regional wall motion abnormalities cannot be excluded. - Aortic valve: Trivial regurgitation. - Mitral valve: Calcified annulus. Moderate to severe regurgitation. Valve area by pressure half-time: 1.81cm^2. Valve area by continuity equation (using LVOT flow): 0.67cm^2. 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    HPI/Subjective: Feels better   Objective: Filed Vitals:   06/20/12 2228 06/21/12 0232 06/21/12 0451 06/21/12 0614  BP: 107/63 103/69 84/49 94/58   Pulse: 63 65 88 81  Temp:  98.4 F (36.9 C) 99 F (37.2 C)   TempSrc:  Oral Oral   Resp:  20 20   Height:      Weight:   98.2 kg (216 lb 7.9 oz)   SpO2:  96% 95%    Patient Vitals for the past 24 hrs:  BP Temp Temp src Pulse Resp SpO2 Weight  06/21/12 0614 94/58 mmHg - - 81 - - -  06/21/12 0451 84/49 mmHg 99 F (37.2 C) Oral 88 20 95 % 98.2 kg (216 lb 7.9 oz)  06/21/12 0232 103/69 mmHg 98.4 F (36.9 C) Oral 65 20 96 % -  06/20/12 2228 107/63 mmHg - - 63 - - -  06/20/12 2044 92/53 mmHg 98.6 F (37 C) Oral 84 20 95 % -  06/20/12 1543 - - - - - - 99.973 kg (220 lb 6.4 oz)  06/20/12 1401 98/66 mmHg 99.2 F (37.3 C) Oral 68 20 97 % -     Intake/Output Summary (Last 24 hours) at 06/21/12 0737 Last data filed at 06/21/12 0453  Gross per 24 hour  Intake  900.5 ml  Output   4125 ml  Net -3224.5 ml   Filed Weights   06/20/12 0346 06/20/12 1543 06/21/12 0451  Weight: 99.973 kg (220 lb 6.4 oz) 99.973 kg (220 lb 6.4 oz) 98.2 kg (216 lb 7.9 oz)    Exam:   General:  axox3  Cardiovascular: Tachycardic regular  without murmurs  Respiratory: By basilar crackles  Abdomen: Soft nontender  Musculoskeletal: Intact   Data Reviewed: Basic Metabolic Panel:  Recent Labs Lab 06/19/12 2328 06/20/12 0304 06/21/12 0625  NA 139 137 143  K 4.2 4.0 4.1  CL 102 101 103  CO2 24 27 31   GLUCOSE 227* 125* 115*  BUN 13 14 14   CREATININE 1.09 1.09 1.01  CALCIUM 9.1 9.0 9.0   Liver Function Tests:  Recent Labs Lab 06/20/12 0304  AST 20  ALT 13  ALKPHOS 88  BILITOT 0.3  PROT 6.8  ALBUMIN 3.4*   No results found for this basename: LIPASE, AMYLASE,  in the last 168 hours No results found for this basename: AMMONIA,  in the last 168  hours CBC:  Recent Labs Lab 06/19/12 2328 06/20/12 0304 06/21/12 0625  WBC 27.9* 16.8* 9.9  NEUTROABS 16.4*  --   --   HGB 14.6 12.8 12.3  HCT 44.0 38.3 37.7  MCV 85.3 82.5 85.1  PLT 270 198 181   Cardiac Enzymes:  Recent Labs Lab 06/19/12 2328 06/20/12 0304 06/20/12 0912 06/20/12 1428  TROPONINI <0.30 <0.30 0.43* <0.30   BNP (last 3 results)  Recent Labs  06/19/12 2328  PROBNP 1050.0*   CBG: No results found for this basename: GLUCAP,  in the last 168 hours  No results found for this or any previous visit (from the past 240 hour(s)).   Studies: Ct Angio Chest W/cm &/or Wo Cm  06/20/2012  *RADIOLOGY REPORT*  Clinical Data: Sudden onset of shortness of breath.  CT ANGIOGRAPHY CHEST  Technique:  Multidetector CT imaging of the chest using the standard protocol during bolus administration of intravenous contrast. Multiplanar reconstructed images including MIPs were obtained and reviewed to evaluate the vascular anatomy.  Contrast: OMNIPAQUE IOHEXOL 350 MG/ML SOLN  Comparison: None.  Findings: Technically adequate study with good opacification of the central and segmental pulmonary arteries.  No focal filling defects demonstrated.  No evidence of significant pulmonary embolus.  Mild cardiac enlargement with calcification in the region of the mitral valve.  Reflux of contrast material into the inferior vena cava suggest passive congestion.  Upper abdominal organs are otherwise unremarkable.  Enlarged lymph nodes are present in the aortopulmonic window region at 13 mm diameter and in the subcarinal region at 14 mm diameter.  Right paratracheal lymph nodes measure up to 16 mm diameter.  Pretracheal lymph nodes measure up to 17 mm diameter.  No axillary lymphadenopathy.  Esophagus is decompressed.  Small bilateral pleural effusions with basilar atelectasis.  Patchy air space disease in the lungs suggests perihilar edema.  No pneumothorax.  Airways appear patent.  Degenerative  changes in the thoracic spine.  IMPRESSION: No evidence of significant pulmonary embolus.  Cardiac enlargement, bilateral pleural effusions, and perihilar airspace disease suggesting congestive failure with edema.  Lymphadenopathy in the mediastinum is of nonspecific etiology.  Consider reactive versus neoplastic lymph nodes.   Original Report Authenticated By: Burman Nieves, M.D.    Dg Chest Portable 1 View  06/20/2012  *RADIOLOGY REPORT*  Clinical Data: Severe shortness of breath.  Pulmonary edema. Cardiomegaly.  PORTABLE CHEST - 1 VIEW  Comparison: 12/27/2008  Findings: The shallow inspiration.  Cardiac enlargement with mild prominence of pulmonary vascularity.  Diffuse interstitial changes suggesting interstitial edema.  Changes are new since previous study.  No blunting of costophrenic angles.  No pneumothorax.  No focal airspace disease.  IMPRESSION: Cardiac enlargement with pulmonary vascular congestion and interstitial edema.   Original  Report Authenticated By: Burman Nieves, M.D.     Scheduled Meds: . antiseptic oral rinse  15 mL Mouth Rinse BID  . ARIPiprazole  7.5 mg Oral Daily  . aspirin EC  81 mg Oral Daily  . atorvastatin  10 mg Oral Daily  . calcium carbonate  1 tablet Oral Daily  . docusate sodium  100 mg Oral BID  . enoxaparin (LOVENOX) injection  40 mg Subcutaneous Q24H  . furosemide  40 mg Intravenous BID  . metoprolol tartrate  12.5 mg Oral BID  . pantoprazole  40 mg Oral Daily  . sodium chloride  3 mL Intravenous Q12H  . traZODone  100 mg Oral QHS  . venlafaxine XR  150 mg Oral Daily   Continuous Infusions: . sodium chloride 10 mL/hr (06/21/12 0510)    Principal Problem:   CHF, acute Active Problems:   GERD   Respiratory distress   Pulmonary edema   Leucocytosis   Morbid obesity   Sinus tachycardia   Elevated troponin   Mitral valve disease   Severe mitral regurgitation   Mitral stenosis      Arrow Emmerich  [facility] Pager [phone]. If  7PM-7AM, please contact night-coverage at [email or web address], password [facility] 06/21/2012, 7:37 AM  LOS: 2 days

## 2012-06-21 NOTE — CV Procedure (Signed)
   Cardiac Catheterization Procedure Note  Name: Priscilla Stevens MRN: [id number] DOB: [birth year]  Procedure: Right Heart Cath, Left Heart Cath, Selective Coronary Angiography, LV angiography  Indication: Mitral valve disease   Procedural Details: The right groin was prepped, draped, and anesthetized with 1% lidocaine. Using the modified Seldinger technique a 5 French sheath was placed in the right femoral artery and a 7 French sheath was placed in the right femoral vein. A Swan-Ganz catheter was used for the right heart catheterization. Standard protocol was followed for recording of right heart pressures and sampling of oxygen saturations. Fick cardiac output was calculated. MP catheter was used for selective coronary angiography and pigtail catheter was used for left ventriculography. There were no immediate procedural complications. The patient was transferred to the post catheterization recovery area for further monitoring.  Procedural Findings: Hemodynamics (mmHg) RA mean 13 RV 47/14 PA 50/25, mean 31 PCWP mean 21 LV 102/11 AO 98/65  Oxygen saturations: PA 57% AO 96%  Cardiac Output (Fick) 4.16 L/min Cardiac Index (Fick) 2.04 PVR 2.4 WU  Mean gradient mitral valve 12.8 mmHg MVA 1.07 cm^2  Coronary angiography: Coronary dominance: right  Left mainstem: No significant disease.   Left anterior descending (LAD): No significant disease.   Left circumflex (LCx): 30% ostial LCx.   Right coronary artery (RCA): No significant disease.   Left ventriculography: Left ventricular systolic function is normal, LVEF is estimated at 60-65% with no wall motion abnormalities in the RAO projection. 3+ mitral regurgitation.   Final Conclusions:  No angiographic coronary disease.  Moderate to severe mitral regurgitation, probably moderate mitral stenosis.  Awaiting TEE.  Will consult CVTS for evaluation.  Will continue current IV Lasix dosing today.   Marca Ancona 06/21/2012, 11:45  AM

## 2012-06-21 NOTE — Progress Notes (Signed)
Patient: Priscilla Stevens / Admit Date: 06/19/2012 / Date of Encounter: 06/21/2012, 9:10 AM   Subjective  Denies SOB, CP. Slept OK. Diuresed 4L yesterday, weight down 4 lbs.   Objective   Telemetry: NSR Physical Exam: Filed Vitals:   06/21/12 0901  BP: 108/60  Pulse: 83  Temp: 98.3 F (36.8 C)  Resp: 19   General: Well developed obese WF in no acute distress.  Head: Normocephalic, atraumatic, sclera non-icteric, no xanthomas, nares are without discharge.  Neck: Negative for carotid bruits. JVD not elevated.  Lungs: Moderate air movement, decreased BS at bases. No longer wheezing. Breathing is unlabored.  Heart: RRR with S1 S2. No significant murmurs, rubs, or gallops appreciated.  Abdomen: Soft, non-tender, non-distended with normoactive bowel sounds. No hepatomegaly. No rebound/guarding. No obvious abdominal masses.  Msk: Strength and tone appear normal for age.  Extremities: No clubbing or cyanosis. No edema. Distal pedal pulses are 2+ and equal bilaterally.  Neuro: Alert and oriented X 3. No facial asymmetry. No focal deficit. Moves all extremities spontaneously.  Psych: Responds to questions appropriately with a normal affect.      Intake/Output Summary (Last 24 hours) at 06/21/12 0910 Last data filed at 06/21/12 0847  Gross per 24 hour  Intake    483 ml  Output   3600 ml  Net  -3117 ml    Inpatient Medications:  . antiseptic oral rinse  15 mL Mouth Rinse BID  . ARIPiprazole  7.5 mg Oral Daily  . aspirin EC  81 mg Oral Daily  . atorvastatin  10 mg Oral Daily  . calcium carbonate  1 tablet Oral Daily  . docusate sodium  100 mg Oral BID  . enoxaparin (LOVENOX) injection  40 mg Subcutaneous Q24H  . furosemide  40 mg Intravenous BID  . metoprolol tartrate  12.5 mg Oral BID  . pantoprazole  40 mg Oral Daily  . sodium chloride  3 mL Intravenous Q12H  . traZODone  100 mg Oral QHS  . venlafaxine XR  150 mg Oral Daily    Labs:  Recent Labs  06/20/12 0304  06/21/12 0625  NA 137 143  K 4.0 4.1  CL 101 103  CO2 27 31  GLUCOSE 125* 115*  BUN 14 14  CREATININE 1.09 1.01  CALCIUM 9.0 9.0    Recent Labs  06/20/12 0304  AST 20  ALT 13  ALKPHOS 88  BILITOT 0.3  PROT 6.8  ALBUMIN 3.4*    Recent Labs  06/19/12 2328 06/20/12 0304 06/21/12 0625  WBC 27.9* 16.8* 9.9  NEUTROABS 16.4*  --   --   HGB 14.6 12.8 12.3  HCT 44.0 38.3 37.7  MCV 85.3 82.5 85.1  PLT 270 198 181    Recent Labs  06/19/12 2328 06/20/12 0304 06/20/12 0912 06/20/12 1428  TROPONINI <0.30 <0.30 0.43* <0.30   No components found with this basename: POCBNP,   Recent Labs  06/20/12 1431  HGBA1C 5.7*    Recent Labs  06/21/12 0625  CHOL 133  HDL 51  LDLCALC 64  TRIG 88  CHOLHDL 2.6    Radiology/Studies:  Ct Angio Chest W/cm &/or Wo Cm3/17/2014  *RADIOLOGY REPORT*  Clinical Data: Sudden onset of shortness of breath.  CT ANGIOGRAPHY CHEST  Technique:  Multidetector CT imaging of the chest using the standard protocol during bolus administration of intravenous contrast. Multiplanar reconstructed images including MIPs were obtained and reviewed to evaluate the vascular anatomy.  Contrast: 100mL OMNIPAQUE IOHEXOL 350 MG/ML SOLN    Comparison: None.  Findings: Technically adequate study with good opacification of the central and segmental pulmonary arteries.  No focal filling defects demonstrated.  No evidence of significant pulmonary embolus.  Mild cardiac enlargement with calcification in the region of the mitral valve.  Reflux of contrast material into the inferior vena cava suggest passive congestion.  Upper abdominal organs are otherwise unremarkable.  Enlarged lymph nodes are present in the aortopulmonic window region at 13 mm diameter and in the subcarinal region at 14 mm diameter.  Right paratracheal lymph nodes measure up to 16 mm diameter.  Pretracheal lymph nodes measure up to 17 mm diameter.  No axillary lymphadenopathy.  Esophagus is decompressed.   Small bilateral pleural effusions with basilar atelectasis.  Patchy air space disease in the lungs suggests perihilar edema.  No pneumothorax.  Airways appear patent.  Degenerative changes in the thoracic spine.  IMPRESSION: No evidence of significant pulmonary embolus.  Cardiac enlargement, bilateral pleural effusions, and perihilar airspace disease suggesting congestive failure with edema.  Lymphadenopathy in the mediastinum is of nonspecific etiology.  Consider reactive versus neoplastic lymph nodes.   Original Report Authenticated By: William Stevens, M.D.    Dg Chest Portable 1 View3/17/2014  *RADIOLOGY REPORT*  Clinical Data: Severe shortness of breath.  Pulmonary edema. Cardiomegaly.  PORTABLE CHEST - 1 VIEW  Comparison: 12/27/2008  Findings: The shallow inspiration.  Cardiac enlargement with mild prominence of pulmonary vascularity.  Diffuse interstitial changes suggesting interstitial edema.  Changes are new since previous study.  No blunting of costophrenic angles.  No pneumothorax.  No focal airspace disease.  IMPRESSION: Cardiac enlargement with pulmonary vascular congestion and interstitial edema.   Original Report Authenticated By: William Stevens, M.D.      Assessment and Plan  1. Acute diastolic CHF - preserved LV function but mod-severe MR now present (did not have in 2010)/MS. She was hypertensive on admission but with subsequent low blood pressure where she states she runs chronically. Good diuresis yesterday. Continue IV Lasix until R&LHC today. TEE scheduled for tomorrow (orders written).  2. 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  3. Normal coronaries 2010; suspect isolated elevated troponin due to demand ischemia. Pt refusing ASA due to h/o GI intolerance. Would not rx Plavix because plan unclear regarding possible surgical intervention. Await cath results before decision on discontinuing ASA. Continue BB.  4. Tachycardia - Initial EKG rhythm is ?atrial flutter  versus sinus tach. Pt reports subjective sense of abrupt SOB episodes over the last few months which could be related to arrhythmia. She is NSR on telemetry. Continue BB. Dose limited by BP. If arrhythmia recurs, there may need to be discussion re: anticoag. 5. 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 Followed q6mo with heme-onc per pt.  6. Mediastinal lymphadenopathy on CT this admission, question reactive vs neoplastic - she will need to f/u heme-onc to decide if further surveillance is needed.  Signed, Dayna Dunn PA-C  Patient seen with PA, agree with the above note.  Continue Lasix for now pending results of RHC/LHC.  Will also evaluate MV disease with RHC/LHC and TEE.   Faduma Cho 06/21/2012 11:04 AM  

## 2012-06-22 ENCOUNTER — Inpatient Hospital Stay (HOSPITAL_COMMUNITY): Payer: Medicare Other

## 2012-06-22 ENCOUNTER — Other Ambulatory Visit (HOSPITAL_COMMUNITY): Payer: Self-pay | Admitting: Radiology

## 2012-06-22 ENCOUNTER — Encounter (HOSPITAL_COMMUNITY): Payer: Self-pay | Admitting: *Deleted

## 2012-06-22 ENCOUNTER — Encounter (HOSPITAL_COMMUNITY)
Admission: EM | Disposition: A | Discharge status: Home Health | Payer: Self-pay | Source: Home / Self Care | Attending: Internal Medicine

## 2012-06-22 ENCOUNTER — Inpatient Hospital Stay (HOSPITAL_COMMUNITY)
Admit: 2012-06-22 | Discharge: 2012-06-22 | Disposition: A | Discharge status: Home / Self Care | Payer: Medicare Other | Attending: Thoracic Surgery (Cardiothoracic Vascular Surgery) | Admitting: Thoracic Surgery (Cardiothoracic Vascular Surgery)

## 2012-06-22 DIAGNOSIS — I059 Rheumatic mitral valve disease, unspecified: Secondary | ICD-10-CM

## 2012-06-22 DIAGNOSIS — Z87891 Personal history of nicotine dependence: Secondary | ICD-10-CM

## 2012-06-22 DIAGNOSIS — Z01810 Encounter for preprocedural cardiovascular examination: Secondary | ICD-10-CM

## 2012-06-22 DIAGNOSIS — I5033 Acute on chronic diastolic (congestive) heart failure: Principal | ICD-10-CM

## 2012-06-22 HISTORY — DX: Acute on chronic diastolic (congestive) heart failure: I50.33

## 2012-06-22 HISTORY — PX: TEE WITHOUT CARDIOVERSION: SHX5443

## 2012-06-22 HISTORY — DX: Personal history of nicotine dependence: Z87.891

## 2012-06-22 LAB — PULMONARY FUNCTION TEST

## 2012-06-22 LAB — BASIC METABOLIC PANEL
Calcium: 9.9 mg/dL (ref 8.4–10.5)
Chloride: 95 mEq/L — ABNORMAL LOW (ref 96–112)
Creatinine, Ser: 1.13 mg/dL — ABNORMAL HIGH (ref 0.50–1.10)
GFR calc Af Amer: 60 mL/min — ABNORMAL LOW (ref >90)
Sodium: 136 mEq/L (ref 135–145)

## 2012-06-22 LAB — CBC
HCT: 40.3 % (ref 36.0–46.0)
Platelets: 193 10*3/uL (ref 150–400)
RDW: 14.6 % (ref 11.5–15.5)
WBC: 11.8 10*3/uL — ABNORMAL HIGH (ref 4.0–10.5)

## 2012-06-22 SURGERY — ECHOCARDIOGRAM, TRANSESOPHAGEAL
Anesthesia: Moderate Sedation

## 2012-06-22 MED ORDER — IOHEXOL 350 MG/ML SOLN
80.0000 mL | Freq: Once | INTRAVENOUS | Status: AC | PRN
  Administered 2012-06-22: 80 mL via INTRAVENOUS

## 2012-06-22 MED ORDER — TRAZODONE HCL 100 MG PO TABS
100.0000 mg | ORAL_TABLET | Freq: Every day | ORAL | Status: DC
  Administered 2012-06-22: 100 mg via ORAL
  Filled 2012-06-22 (×2): qty 1

## 2012-06-22 MED ORDER — VENLAFAXINE HCL ER 150 MG PO CP24
150.0000 mg | ORAL_CAPSULE | Freq: Every day | ORAL | Status: DC
  Administered 2012-06-23: 150 mg via ORAL
  Filled 2012-06-22 (×2): qty 1

## 2012-06-22 MED ORDER — ACETAMINOPHEN 325 MG PO TABS: 650.0000 mg | ORAL_TABLET | Freq: Four times a day (QID) | ORAL | Status: DC | PRN

## 2012-06-22 MED ORDER — MIDAZOLAM HCL 5 MG/ML IJ SOLN
INTRAMUSCULAR | Status: AC
  Filled 2012-06-22: qty 2

## 2012-06-22 MED ORDER — LEVALBUTEROL HCL 0.63 MG/3ML IN NEBU
0.6300 mg | INHALATION_SOLUTION | Freq: Once | RESPIRATORY_TRACT | Status: AC
  Administered 2012-06-22: 0.63 mg via RESPIRATORY_TRACT

## 2012-06-22 MED ORDER — PANTOPRAZOLE SODIUM 40 MG PO TBEC
40.0000 mg | DELAYED_RELEASE_TABLET | Freq: Every day | ORAL | Status: DC
  Administered 2012-06-23: 40 mg via ORAL
  Filled 2012-06-22: qty 1

## 2012-06-22 MED ORDER — CALCIUM CARBONATE 1250 (500 CA) MG PO TABS
1.0000 | ORAL_TABLET | Freq: Every day | ORAL | Status: DC
  Administered 2012-06-23: 500 mg via ORAL
  Filled 2012-06-22 (×2): qty 1

## 2012-06-22 MED ORDER — HYDROCODONE-ACETAMINOPHEN 5-325 MG PO TABS: 1.0000 | ORAL_TABLET | Freq: Four times a day (QID) | ORAL | Status: DC | PRN

## 2012-06-22 MED ORDER — ATORVASTATIN CALCIUM 10 MG PO TABS
10.0000 mg | ORAL_TABLET | Freq: Every day | ORAL | Status: DC
  Administered 2012-06-23: 10 mg via ORAL
  Filled 2012-06-22 (×2): qty 1

## 2012-06-22 MED ORDER — FENTANYL CITRATE 0.05 MG/ML IJ SOLN
INTRAMUSCULAR | Status: DC | PRN
  Administered 2012-06-22 (×2): 25 ug via INTRAVENOUS

## 2012-06-22 MED ORDER — ONDANSETRON HCL 4 MG/2ML IJ SOLN: 4.0000 mg | Freq: Four times a day (QID) | INTRAMUSCULAR | Status: DC | PRN

## 2012-06-22 MED ORDER — ARIPIPRAZOLE 15 MG PO TABS
7.5000 mg | ORAL_TABLET | Freq: Every day | ORAL | Status: DC
  Administered 2012-06-23: 7.5 mg via ORAL
  Filled 2012-06-22 (×2): qty 1

## 2012-06-22 MED ORDER — MIDAZOLAM HCL 10 MG/2ML IJ SOLN
INTRAMUSCULAR | Status: DC | PRN
  Administered 2012-06-22: 1 mg via INTRAVENOUS
  Administered 2012-06-22: 2 mg via INTRAVENOUS

## 2012-06-22 MED ORDER — TRAMADOL HCL 50 MG PO TABS
50.0000 mg | ORAL_TABLET | Freq: Three times a day (TID) | ORAL | Status: DC | PRN
  Filled 2012-06-22: qty 1

## 2012-06-22 MED ORDER — FENTANYL CITRATE 0.05 MG/ML IJ SOLN
INTRAMUSCULAR | Status: AC
  Filled 2012-06-22: qty 2

## 2012-06-22 MED ORDER — AMIODARONE HCL 200 MG PO TABS
200.0000 mg | ORAL_TABLET | Freq: Two times a day (BID) | ORAL | Status: DC
  Administered 2012-06-23: 200 mg via ORAL
  Filled 2012-06-22 (×3): qty 1

## 2012-06-22 MED ORDER — METOPROLOL TARTRATE 12.5 MG HALF TABLET
12.5000 mg | ORAL_TABLET | Freq: Every day | ORAL | Status: DC
  Administered 2012-06-22: 12.5 mg via ORAL
  Filled 2012-06-22 (×2): qty 1

## 2012-06-22 MED ORDER — BUTAMBEN-TETRACAINE-BENZOCAINE 2-2-14 % EX AERO
INHALATION_SPRAY | CUTANEOUS | Status: AC
  Filled 2012-06-22: qty 56

## 2012-06-22 MED ORDER — BUTAMBEN-TETRACAINE-BENZOCAINE 2-2-14 % EX AERO
INHALATION_SPRAY | CUTANEOUS | Status: DC | PRN
  Administered 2012-06-22: 2 via TOPICAL

## 2012-06-22 NOTE — Interval H&P Note (Signed)
History and Physical Interval Note:  06/22/2012 9:12 AM  Priscilla Stevens  has presented today for surgery, with the diagnosis of afib  The various methods of treatment have been discussed with the patient and family. After consideration of risks, benefits and other options for treatment, the patient has consented to  Procedure(s): TRANSESOPHAGEAL ECHOCARDIOGRAM (TEE) (N/A) as a surgical intervention .  The patient's history has been reviewed, patient examined, no change in status, stable for surgery.  I have reviewed the patient's chart and labs.  Questions were answered to the patient's satisfaction.     Anina Schnake [employer]

## 2012-06-22 NOTE — Progress Notes (Signed)
VASCULAR LAB PRELIMINARY  PRELIMINARY  PRELIMINARY  PRELIMINARY  Pre-op Cardiac Surgery  Carotid Findings:  Bilateral: No evidence of hemodynamically significant internal carotid artery stenosis.  Vertebral artery flow on the right is antegrade. 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

## 2012-06-22 NOTE — Plan of Care (Signed)
Problem: Phase I Progression Outcomes Goal: EF % per last Echo/documented,Core Reminder form on chart Outcome: Completed/Met Date Met:  06/22/12 Echo done 06/20/2012 showed EF 55-60% Louretta Parma, RN

## 2012-06-22 NOTE — Consult Note (Addendum)
CARDIOTHORACIC SURGERY CONSULTATION REPORT  PCP is [name], MD Referring Provider is  [facility], Eliot Ford, MD   Reason for consultation:  Mitral stenosis and mitral regurgitation   HPI:  Patient is a 62 year old obese white female from [place] with no previous history of heart murmur nor any previous diagnosis of congestive heart failure.  The patient denies any known history of rheumatic fever.  However, the patient describes a several year history of progressive symptoms of exertional shortness of breath, orthopnea, and chronic dry nonproductive cough. She was evaluated several years ago by a pulmonologist and found not to have sleep apnea. Cardiac catheterization performed in 2010 was notable for the absence of coronary artery disease. The patient did not have an echocardiogram performed. She states that over the past few months her breathing has been getting worse. She has history of exertional shortness of breath that has seemed to get worse in the winter months. She has chronic orthopnea and has been sleeping in a chair for several years. These symptoms have gotten worse until she finally decided to quit smoking two months ago.  Despite stopping smoking her breathing continued to get worse, and she recently began to experience episodes of paroxysmal nocturnal dyspnea which typically would improve and the patient was set up for a period of time.  During this period time she also experienced episodes of tachypalpitations associated with relatively brief episodes of acutely worse shortness of breath. 3 days ago she had a particularly severe episode of shortness of breath at rest which persisted, ultimately prompting her to call EMS.  She was admitted to the hospital and noted to be in congestive heart failure with pulmonary edema and small bilateral pleural effusions. CT angiogram of the chest was performed and notably absent for signs of pulmonary embolus. Troponin levels were  borderline elevated. She was seen in consultation by Dr. Eden Emms and an echocardiogram was performed demonstrating normal left ventricular systolic function with moderate to severe mitral stenosis and moderate to severe mitral regurgitation. She underwent left and right heart catheterization by Dr. Shirlee Latch and was found to have normal coronary artery anatomy with no significant coronary artery disease. Pulmonary artery pressures were moderately elevated. 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 Cardiothoracic surgical consultation has been requested to consider surgical intervention.  The patient lives a somewhat sedentary lifestyle and she admits that she does not exercise much. She was placed on disability because of chronic cough by her pulmonologist a few years ago without having an echocardiogram performed.  She has chronic exertional shortness of breath which has gotten worse culminating in her acute exacerbation of chronic diastolic congestive heart failure this admission. The patient has never had any chest pain or chest tightness either with activity or at rest. She has chronic orthopnea with episodes of PND. She has not had lower extremity edema. She has occasional dizzy spell without syncope. She has had chronic palpitations which have been worse recently. She has no previous documented history of atrial fibrillation or other arrhythmia.  Past Medical History  Diagnosis Date  . Fibromyalgia   . Anxiety   . Pneumonia   . CLL (chronic lymphocytic leukemia)   . Cough     Eval by pulm in past: essentially normal PFTs, cough ddx was variant asthma, upper airway cough syndrome (previously termed post nasal  drip syndrome) or GERD,   . Diverticulitis   . Severe mitral regurgitation 06/20/2012  . Mitral stenosis 06/20/2012  .  Morbid obesity 06/20/2012  . History of tobacco abuse 06/22/2012  . Acute on chronic diastolic heart failure 06/22/2012    Past Surgical History  Procedure Laterality Date  . Appendectomy    . Total vaginal hysterectomy    . Tonsillectomy      Family History  Problem Relation Age of Onset  . Congestive Heart Failure Mother     History   Social History  . Marital Status: Married    Spouse Name: N/A    Number of Children: N/A  . Years of Education: N/A   Occupational History  . disabled    Social History Main Topics  . Smoking status: Former Smoker -- 1.00 packs/day for 30 years    Quit date: 04/06/2012  . Smokeless tobacco: Not on file     Comment: Quit January 2014. Smoked for 25 yrs.  . Alcohol Use: 0.0 oz/week     Comment: 2x/year  . Drug Use: No  . Sexually Active: Not on file   Other Topics Concern  . Not on file   Social History Narrative   Sedentary lifestyle    Prior to Admission medications   Medication Sig Start Date End Date Taking? Authorizing Provider  ARIPiprazole (ABILIFY) 5 MG tablet Take 7.5 mg by mouth daily.   Yes Historical Provider, MD  atorvastatin (LIPITOR) 10 MG tablet Take 10 mg by mouth daily.   Yes Historical Provider, MD  calcium carbonate (OS-CAL) 600 MG TABS Take 600 mg by mouth daily.   Yes Historical Provider, MD  HYDROcodone-acetaminophen (NORCO/VICODIN) 5-325 MG per tablet Take 1 tablet by mouth every 6 (six) hours as needed for pain.   Yes Historical Provider, MD  Lansoprazole (PREVACID PO) Take 1 tablet by mouth daily. OTC   Yes Historical Provider, MD  traMADol (ULTRAM) 50 MG tablet Take 50 mg by mouth every 8 (eight) hours as needed for pain.   Yes Historical Provider, MD  traZODone (DESYREL) 100 MG tablet Take 100 mg by mouth at bedtime.   Yes Historical Provider, MD  Venlafaxine HCl 225 MG TB24 Take 1 tablet by mouth daily.   Yes Historical Provider, MD    Current Facility-Administered Medications  Medication Dose Route  Frequency Provider Last Rate Last Dose  . acetaminophen (TYLENOL) tablet 650 mg  650 mg Oral Q6H PRN Erick Blinks, MD      . ARIPiprazole (ABILIFY) tablet 7.5 mg  7.5 mg Oral Daily Erick Blinks, MD      . atorvastatin (LIPITOR) tablet 10 mg  10 mg Oral Daily Erick Blinks, MD      . calcium carbonate (OS-CAL - dosed in mg of elemental calcium) tablet 500 mg of elemental calcium  1 tablet Oral Daily Erick Blinks, MD      . HYDROcodone-acetaminophen (NORCO/VICODIN) 5-325 MG per tablet 1 tablet  1 tablet Oral Q6H PRN Erick Blinks, MD      . metoprolol tartrate (LOPRESSOR) tablet 12.5 mg  12.5 mg Oral Daily Erick Blinks, MD   12.5 mg at 06/22/12 1659  . ondansetron (ZOFRAN) injection 4 mg  4 mg Intravenous Q6H PRN Erick Blinks, MD      . pantoprazole (PROTONIX) EC tablet 40 mg  40 mg Oral Daily Erick Blinks, MD      . traMADol (ULTRAM) tablet 50 mg  50 mg Oral Q8H PRN Erick Blinks, MD      .  traZODone (DESYREL) tablet 100 mg  100 mg Oral QHS Erick Blinks, MD      . venlafaxine XR (EFFEXOR-XR) 24 hr capsule 150 mg  150 mg Oral Daily Erick Blinks, MD        Allergies  Allergen Reactions  . Aspirin     REACTION: GI upset      Review of Systems:   General:  normal appetite, decreased energy, no weight gain, no weight loss, no fever  Cardiac:  no chest pain with exertion, no chest pain at rest, + SOB with mild-moderate exertion, + resting SOB prior to admission, + PND, + orthopnea, + palpitations, no arrhythmia, no atrial fibrillation, no LE edema, + dizzy spells, no syncope  Respiratory:  + shortness of breath, no home oxygen, no productive cough, + chronic dry cough, no bronchitis, no wheezing, no hemoptysis, no asthma, no pain with inspiration or cough, no sleep apnea, no CPAP at night  GI:   no difficulty swallowing, + reflux, no frequent heartburn, no hiatal hernia, no abdominal pain, no constipation, no diarrhea, no hematochezia, no hematemesis, no melena  GU:   no  dysuria,  no frequency, no urinary tract infection, no hematuria, no kidney stones, no kidney disease  Vascular:  no pain suggestive of claudication, no pain in feet, no leg cramps, no varicose veins, no DVT, no non-healing foot ulcer  Neuro:   no stroke, no TIA's, no seizures, no headaches, no temporary blindness one eye,  no slurred speech, ? peripheral neuropathy with chronic numbness right 4th and 5th fingers and ulnar aspect right hand, + chronic pain, no instability of gait, no memory/cognitive dysfunction  Musculoskeletal: + arthritis, no joint swelling, + myalgias, no difficulty walking, decreased mobility   Skin:   no rash, no itching, no skin infections, no pressure sores or ulcerations  Psych:   + anxiety, no depression, no nervousness, no unusual recent stress  Eyes:   no blurry vision, no floaters, no recent vision changes, + wears glasses or contacts  ENT:   no hearing loss, no loose or painful teeth, no dentures, last saw dentist within the past year  Hematologic:  no easy bruising, no abnormal bleeding, no clotting disorder, no frequent epistaxis  Endocrine:  + borderline diabetes, does not check CBG's at home     Physical Exam:   BP 100/64  Pulse 81  Temp(Src) 98.6 F (37 C) (Oral)  Resp 20  Ht 5\' 5"  (1.651 m)  Wt 96.9 kg (213 lb 10 oz)  BMI 35.55 kg/m2  SpO2 95%  General:  Obese but o/w well-appearing  HEENT:  Unremarkable   Neck:   no JVD, no bruits, no adenopathy   Chest:   clear to auscultation, symmetrical breath sounds, no wheezes, no rhonchi   CV:   RRR, grade III/VI harsh systolic murmur   Abdomen:  soft, non-tender, no masses   Extremities:  warm, well-perfused, pulses diminished, no LE edema  Rectal/GU  Deferred  Neuro:   Grossly non-focal and symmetrical throughout  Skin:   Clean and dry, no rashes, no breakdown  Diagnostic Tests:  Transthoracic Echocardiography  Patient: Priscilla Stevens, Priscilla Stevens MR #: [id number] Study Date: 06/20/2012 Gender: F Age:  89 Height: 165.1cm Weight: 100kg BSA: 2.88m^2 Pt. Status: Room: [facility]  PERFORMING [place], [facility] SONOGRAPHER [place], RCS [place] Junk[place], [place] cc:  ------------------------------------------------------------ LV EF: 55% - 60%  ------------------------------------------------------------ Indications: CHF - 428.0. Tachycardia 785.0.  ------------------------------------------------------------ History: Risk factors: Morbidly obese.  ------------------------------------------------------------ Study Conclusions  -  Left ventricle: The cavity size was normal. Wall thickness was normal. Systolic function was normal. The estimated ejection fraction was in the range of 55% to 60%. Regional wall motion abnormalities cannot be excluded. - Aortic valve: Trivial regurgitation. - Mitral valve: Calcified annulus. Moderate to severe regurgitation. Valve area by pressure half-time: 1.81cm^2. Valve area by continuity equation (using LVOT flow): 0.67cm^2. Impressions:  - Normal LV function; severe MAC with moderate to severe MR; mild MS by pressure half time but severe by mean gradient (may be elevated secondary to MR); suggest TEE to further assess. Transthoracic echocardiography. M-mode, complete 2D, spectral Doppler, and color Doppler. Height: Height: 165.1cm. Height: 65in. Weight: Weight: 100kg. Weight: 220lb. Body mass index: BMI: 36.7kg/m^2. Body surface area: BSA: 2.8m^2. Blood pressure: 127/83. Patient status: Inpatient. Location: Echo laboratory.  ------------------------------------------------------------  ------------------------------------------------------------ Left ventricle: The cavity size was normal. Wall thickness was normal. Systolic function was normal. The estimated ejection fraction was in the range of 55% to 60%. Regional wall motion abnormalities cannot be excluded.  ------------------------------------------------------------ Aortic  valve: Trileaflet; normal thickness leaflets. Mobility was not restricted. Doppler: Transvalvular velocity was within the normal range. There was no stenosis. Trivial regurgitation.  ------------------------------------------------------------ Aorta: Aortic root: The aortic root was normal in size.  ------------------------------------------------------------ Mitral valve: Calcified annulus. Mobility was not restricted. Doppler: Transvalvular velocity was within the normal range. There was no evidence for stenosis. Moderate to severe regurgitation. Valve area by pressure half-time: 1.81cm^2. Indexed valve area by pressure half-time: 0.83cm^2/m^2. Valve area by continuity equation (using LVOT flow): 0.67cm^2. Indexed valve area by continuity equation (using LVOT flow): 0.31cm^2/m^2. Mean gradient: 17mm Hg (D). Peak gradient: 18mm Hg (D).  ------------------------------------------------------------ Left atrium: The atrium was normal in size.  ------------------------------------------------------------ Right ventricle: The cavity size was normal. Systolic function was normal.  ------------------------------------------------------------ Pulmonic valve: Doppler: Transvalvular velocity was within the normal range. There was no evidence for stenosis.  ------------------------------------------------------------ Tricuspid valve: Structurally normal valve. Doppler: Transvalvular velocity was within the normal range. Trivial regurgitation.  ------------------------------------------------------------ Right atrium: The atrium was normal in size.  ------------------------------------------------------------ Pericardium: There was no pericardial effusion.  ------------------------------------------------------------ Systemic veins: Inferior vena cava: The vessel was normal in size.  ------------------------------------------------------------  2D measurements Normal Doppler  measurements Norma Left ventricle l LVID ED, 43.1 mm 43-52 LVOT chord, Peak vel, 102 cm/s ----- PLAX S LVID ES, 25.7 mm 23-38 VTI, S 14.1 cm ----- chord, Mitral valve PLAX Peak E 213.11 cm/s ----- FS, chord, 40 % >29 vel PLAX Mean vel, 198 cm/s ----- LVPW, ED 9.42 mm ------ D IVS/LVPW 1.01 <1.3 Decelerat 507.54 cm/s^2 ----- ratio, ED ion slope Ventricular septum Decelerat 420 ms 150-2 IVS, ED 9.55 mm ------ ion time 30 LVOT Pressure 122 ms ----- Diam, S 20 mm ------ half-time Area 3.14 cm^2 ------ Mean 17 mm Hg ----- Aorta gradient, Root diam, 32 mm ------ D ED Peak 18 mm Hg ----- Left atrium gradient, AP dim 39 mm ------ D AP dim 1.78 cm/m^2 <2.2 Area 1.81 cm^2 ----- index (PHT) Area 0.83 cm^2/m ----- index ^2 (PHT) Area 0.67 cm^2 ----- (LVOT) continuit y Area 0.31 cm^2/m ----- index ^2 (LVOT cont) Annulus 66.5 cm ----- VTI Right ventricle Sa vel, 15.2 cm/s ----- lat ann, tiss DP  ------------------------------------------------------------ Prepared and Electronically Authenticated by  Olga Millers [id number]:[id number]   Cardiac Catheterization Procedure Note  Name: IRYS NIGH  MRN: [id number]  DOB: 10/23/1950  Procedure: Right Heart Cath, Left Heart Cath, Selective Coronary Angiography, LV angiography  Indication: Mitral valve disease  Procedural Details: The right groin was prepped, draped, and anesthetized with 1% lidocaine. Using the modified Seldinger technique a 5 French sheath was placed in the right femoral artery and a 7 French sheath was placed in the right femoral vein. A Swan-Ganz catheter was used for the right heart catheterization. Standard protocol was followed for recording of right heart pressures and sampling of oxygen saturations. Fick cardiac output was calculated. MP catheter was used for selective coronary angiography and pigtail catheter was used for left ventriculography. There were no immediate procedural complications.  The patient was transferred to the post catheterization recovery area for further monitoring.  Procedural Findings:  Hemodynamics (mmHg)  RA mean 13  RV 47/14  PA 50/25, mean 31  PCWP mean 21  LV 102/11  AO 98/65  Oxygen saturations:  PA 57%  AO 96%  Cardiac Output (Fick) 4.16 L/min  Cardiac Index (Fick) 2.04  PVR 2.4 WU  Mean gradient mitral valve 12.8 mmHg  MVA 1.07 cm^2  Coronary angiography:  Coronary dominance: right  Left mainstem: No significant disease.  Left anterior descending (LAD): No significant disease.  Left circumflex (LCx): 30% ostial LCx.  Right coronary artery (RCA): No significant disease.  Left ventriculography: Left ventricular systolic function is normal, LVEF is estimated at 60-65% with no wall motion abnormalities in the RAO projection. 3+ mitral regurgitation.  Final Conclusions: No angiographic coronary disease. Moderate to severe mitral regurgitation, probably moderate mitral stenosis. Awaiting TEE. Will consult CVTS for evaluation. Will continue current IV Lasix dosing today.  Marca Ancona  06/21/2012, 11:45 AM    Procedure: TEE  Indication: Mitral valve disease  Sedation: Versed 4 mg IV, Fentanyl 50 mcg IV  Findings: See echo section for full report. Normal LV size with mild LV hypertrophy. EF 60-65%. The mitral valve and apparatus, and particularly the posterior leaflet, were heavily calcified. The anterior leaflet had a hockey-stick appearance, possibly rheumatic. There was moderate to severe mitral regurgitation and moderate mitral stenosis. Normal RV size and systolic function.  Impression: Moderate to severe MR, moderate mitral stenosis. Given presentation with CHF, would recommend mitral valve surgery. Will be evaluated by CVTS.  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 She describes a long  history of symptoms consistent with chronic diastolic congestive heart failure with acute exacerbation causing her current hospitalization with class IV symptoms. She has improved rapidly with diuretic therapy. Left ventricular systolic function is well-preserved and the patient does not have significant coronary artery disease.  Her valve appears to be rheumatic and will unquestionably need to be replaced.  Risks associated with surgical intervention will be somewhat elevated do to the patient's obesity, long-standing history of tobacco abuse with the possibility of significant COPD, and history of CLL.  She may be an acceptable candidate for minimally invasive approach for surgery.      Plan:  The rationale for elective mitral valve repair surgery has been explained, including a comparison between surgery and continued medical therapy with close follow-up.  We discussed the possibility of replacing the mitral valve using a mechanical prosthesis with the attendant need for long-term anticoagulation versus the alternative of replacing it using a bioprosthetic tissue valve with its potential for late structural valve deterioration and failure, depending upon the patient's longevity.  The patient specifically requests that if the mitral valve must be replaced that it be  done using a mechanical valve.  Alternative surgical approaches have been discussed, including a comparison between conventional sternotomy and minimally-invasive techniques.  The relative risks and benefits of each have been reviewed as they pertain to the patient's specific circumstances, and all of their questions have been addressed.  All questions have been addressed.  We'll obtain CT angiogram of the abdomen and pelvis prior to hospital discharge to assess her distal aorta and pelvic vasculature for possible femoral cannulation for surgery.  We will also get PFT's performed to r/o significant COPD.  We'll start the patient on amiodarone  preoperatively to decrease her risk of perioperative atrial arrhythmias. We tentatively plan to proceed with surgery on Wednesday, April 2. The patient will return to see me in the office on Monday, March 31.  All questions have been answered.     Salvatore Decent. Cornelius Moras, MD 06/22/2012 6:09 PM  I spent in excess of 90 minutes of time directly involved in the conduct of this consultation.

## 2012-06-22 NOTE — H&P (View-Only) (Signed)
Patient: Priscilla Stevens / Admit Date: 06/19/2012 / Date of Encounter: 06/21/2012, 9:10 AM   Subjective  Denies SOB, CP. Slept OK. Diuresed 4L yesterday, weight down 4 lbs.   Objective   Telemetry: NSR Physical Exam: Filed Vitals:   06/21/12 0901  BP: 108/60  Pulse: 83  Temp: 98.3 F (36.8 C)  Resp: 19   General: Well developed obese WF in no acute distress.  Head: Normocephalic, atraumatic, sclera non-icteric, no xanthomas, nares are without discharge.  Neck: Negative for carotid bruits. JVD not elevated.  Lungs: Moderate air movement, decreased BS at bases. No longer wheezing. Breathing is unlabored.  Heart: RRR with S1 S2. No significant murmurs, rubs, or gallops appreciated.  Abdomen: Soft, non-tender, non-distended with normoactive bowel sounds. No hepatomegaly. No rebound/guarding. No obvious abdominal masses.  Msk: Strength and tone appear normal for age.  Extremities: No clubbing or cyanosis. No edema. Distal pedal pulses are 2+ and equal bilaterally.  Neuro: Alert and oriented X 3. No facial asymmetry. No focal deficit. Moves all extremities spontaneously.  Psych: Responds to questions appropriately with a normal affect.      Intake/Output Summary (Last 24 hours) at 06/21/12 0910 Last data filed at 06/21/12 0847  Gross per 24 hour  Intake    483 ml  Output   3600 ml  Net  -3117 ml    Inpatient Medications:  . antiseptic oral rinse  15 mL Mouth Rinse BID  . ARIPiprazole  7.5 mg Oral Daily  . aspirin EC  81 mg Oral Daily  . atorvastatin  10 mg Oral Daily  . calcium carbonate  1 tablet Oral Daily  . docusate sodium  100 mg Oral BID  . enoxaparin (LOVENOX) injection  40 mg Subcutaneous Q24H  . furosemide  40 mg Intravenous BID  . metoprolol tartrate  12.5 mg Oral BID  . pantoprazole  40 mg Oral Daily  . sodium chloride  3 mL Intravenous Q12H  . traZODone  100 mg Oral QHS  . venlafaxine XR  150 mg Oral Daily    Labs:  Recent Labs  06/20/12 0304  06/21/12 0625  NA 137 143  K 4.0 4.1  CL 101 103  CO2 27 31  GLUCOSE 125* 115*  BUN 14 14  CREATININE 1.09 1.01  CALCIUM 9.0 9.0    Recent Labs  06/20/12 0304  AST 20  ALT 13  ALKPHOS 88  BILITOT 0.3  PROT 6.8  ALBUMIN 3.4*    Recent Labs  06/19/12 2328 06/20/12 0304 06/21/12 0625  WBC 27.9* 16.8* 9.9  NEUTROABS 16.4*  --   --   HGB 14.6 12.8 12.3  HCT 44.0 38.3 37.7  MCV 85.3 82.5 85.1  PLT 270 198 181    Recent Labs  06/19/12 2328 06/20/12 0304 06/20/12 0912 06/20/12 1428  TROPONINI <0.30 <0.30 0.43* <0.30   No components found with this basename: POCBNP,   Recent Labs  06/20/12 1431  HGBA1C 5.7*    Recent Labs  06/21/12 0625  CHOL 133  HDL 51  LDLCALC 64  TRIG 88  CHOLHDL 2.6    Radiology/Studies:  Ct Angio Chest W/cm &/or Wo Cm3/17/2014  *RADIOLOGY REPORT*  Clinical Data: Sudden onset of shortness of breath.  CT ANGIOGRAPHY CHEST  Technique:  Multidetector CT imaging of the chest using the standard protocol during bolus administration of intravenous contrast. Multiplanar reconstructed images including MIPs were obtained and reviewed to evaluate the vascular anatomy.  Contrast: OMNIPAQUE IOHEXOL 350 MG/ML SOLN  Comparison: None.  Findings: Technically adequate study with good opacification of the central and segmental pulmonary arteries.  No focal filling defects demonstrated.  No evidence of significant pulmonary embolus.  Mild cardiac enlargement with calcification in the region of the mitral valve.  Reflux of contrast material into the inferior vena cava suggest passive congestion.  Upper abdominal organs are otherwise unremarkable.  Enlarged lymph nodes are present in the aortopulmonic window region at 13 mm diameter and in the subcarinal region at 14 mm diameter.  Right paratracheal lymph nodes measure up to 16 mm diameter.  Pretracheal lymph nodes measure up to 17 mm diameter.  No axillary lymphadenopathy.  Esophagus is decompressed.   Small bilateral pleural effusions with basilar atelectasis.  Patchy air space disease in the lungs suggests perihilar edema.  No pneumothorax.  Airways appear patent.  Degenerative changes in the thoracic spine.  IMPRESSION: No evidence of significant pulmonary embolus.  Cardiac enlargement, bilateral pleural effusions, and perihilar airspace disease suggesting congestive failure with edema.  Lymphadenopathy in the mediastinum is of nonspecific etiology.  Consider reactive versus neoplastic lymph nodes.   Original Report Authenticated By: Burman Nieves, M.D.    Dg Chest Portable 1 View3/17/2014  *RADIOLOGY REPORT*  Clinical Data: Severe shortness of breath.  Pulmonary edema. Cardiomegaly.  PORTABLE CHEST - 1 VIEW  Comparison: 12/27/2008  Findings: The shallow inspiration.  Cardiac enlargement with mild prominence of pulmonary vascularity.  Diffuse interstitial changes suggesting interstitial edema.  Changes are new since previous study.  No blunting of costophrenic angles.  No pneumothorax.  No focal airspace disease.  IMPRESSION: Cardiac enlargement with pulmonary vascular congestion and interstitial edema.   Original Report Authenticated By: Burman Nieves, M.D.      Assessment and Plan  1. Acute diastolic CHF - preserved LV function but mod-severe MR now present (did not have in 2010)/MS. She was hypertensive on admission but with subsequent low blood pressure where she states she runs chronically. Good diuresis yesterday. Continue IV Lasix until R&LHC today. TEE scheduled for tomorrow (orders written).  2. 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  3. Normal coronaries 2010; suspect isolated elevated troponin due to demand ischemia. Pt refusing ASA due to h/o GI intolerance. Would not rx Plavix because plan unclear regarding possible surgical intervention. Await cath results before decision on discontinuing ASA. Continue BB.  4. Tachycardia - Initial EKG rhythm is ?atrial flutter  versus sinus tach. Pt reports subjective sense of abrupt SOB episodes over the last few months which could be related to arrhythmia. She is NSR on telemetry. Continue BB. Dose limited by BP. If arrhythmia recurs, there may need to be discussion re: anticoag. 5. 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 Followed q11mo with heme-onc per pt.  6. Mediastinal lymphadenopathy on CT this admission, question reactive vs neoplastic - she will need to f/u heme-onc to decide if further surveillance is needed.  Signed, Ronie Spies PA-C  Patient seen with PA, agree with the above note.  Continue Lasix for now pending results of RHC/LHC.  Will also evaluate MV disease with RHC/LHC and TEE.   Marca Ancona 06/21/2012 11:04 AM

## 2012-06-22 NOTE — Progress Notes (Signed)
Pt not restarted on lasix post cath.  Notified Dr. Kerry Hough, was told that the need for lasix would be reassessed tomorrow.  Will continue to monitor pt.

## 2012-06-22 NOTE — Progress Notes (Signed)
Utilization Review Completed.   Makinlee Awwad, RN, BSN Nurse Case Manager  [phone]  

## 2012-06-22 NOTE — CV Procedure (Signed)
Procedure: TEE  Indication: Mitral valve disease  Sedation: Versed 4 mg IV, Fentanyl 50 mcg IV  Findings: See echo section for full report.  Normal LV size with mild LV hypertrophy.  EF 60-65%.  The mitral valve and apparatus, and particularly the posterior leaflet, were heavily calcified.  The anterior leaflet had a hockey-stick appearance, possibly rheumatic.  There was moderate to severe mitral regurgitation and moderate mitral stenosis.  Normal RV size and systolic function.    Impression: Moderate to severe MR, moderate mitral stenosis.  Given presentation with CHF, would recommend mitral valve surgery.  Will be evaluated by CVTS.    Marca Ancona 06/22/2012 9:40 AM

## 2012-06-22 NOTE — Progress Notes (Signed)
[facility] PROGRESS NOTE  Priscilla Stevens VHQ:[id number] DOB: Jul 08, 1950 DOA: 06/19/2012 PCP: Priscilla Inch, MD  Assessment/Plan: 1. Acute respiratory failure - secondary to acute congestive heart failure. Improved after IV furosemide, oxygen therapy. Did not require BiPAP.  2. New onset CHF secondary to mitral valve disease  - patient has never had hypertension, ischemic heart disease. She denies an acute viral illness recently. Echocardiogram with significant mitral regurgitation . Patient was given IV furosemide on admission, low dose beta blocker to control the tachycardia. TSH 1.812 TEE indicates severe mitral regurgitation.  TCTS consult pending.  3.    Leukocytosis with hx of CLL - improved-suspect some of this was demargination as it resolved by 06/21/12 - no further w/u.  4. positive troponin without chest pain - probably secondary to decompensated CHF. LHC 06/21/12 without CAD.    5. tachycardia in the emergency room-presumed SVT-treated with adenosine unsuccessfully. In retrospect probably patient had atrial flutter . Started beta blocker on March 17  6. Depression and anxiety - continue home medications. Since patient has significant tachycardia I am going to decrease her Effexor dose to 150 daily.   Code Status: full code  Family Communication: multiple family members at the bedside. Disposition Plan: home    Consultants:  Priscilla Stevens   Procedures:  Echo  - Left ventricle: The cavity size was normal. Wall thickness was normal. Systolic function was normal. The estimated ejection fraction was in the range of 55% to 60%. Regional wall motion abnormalities cannot be excluded. - Aortic valve: Trivial regurgitation. - Mitral valve: Calcified annulus. Moderate to severe regurgitation. Valve area by pressure half-time: 1.81cm^2. Valve area by continuity equation (using LVOT flow): 0.67cm^2. Impressions: - Normal LV function; severe MAC with moderate to  severe MR; mild MS by pressure half time but severe by mean gradient (may be elevated secondary to MR); suggest TEE to further assess.  Cardiac Cath 3/18:No angiographic coronary disease. Moderate to severe mitral regurgitation, probably moderate mitral stenosis.   TEE 3/19: Moderate to severe MR, moderate mitral stenosis.    HPI/Subjective: Feels better today.  Feels breathing is improving.  Objective: Filed Vitals:   06/22/12 0950 06/22/12 1000 06/22/12 1022 06/22/12 1430  BP: 108/55 102/76 84/60 100/64  Pulse:      Temp:    98.6 F (37 C)  TempSrc:    Oral  Resp: 17 20  20   Height:      Weight:      SpO2: 95% 95%  95%   Patient Vitals for the past 24 hrs:  BP Temp Temp src Pulse Resp SpO2 Weight  06/22/12 1430 100/64 mmHg 98.6 F (37 C) Oral - 20 95 % -  06/22/12 1022 84/60 mmHg - - - - - -  06/22/12 1000 102/76 mmHg - - - 20 95 % -  06/22/12 0950 108/55 mmHg - - - 17 95 % -  06/22/12 0942 106/69 mmHg 98.6 F (37 C) Oral - 21 94 % -  06/22/12 0930 127/86 mmHg - - - 13 97 % -  06/22/12 0920 103/66 mmHg - - - 14 97 % -  06/22/12 0915 102/58 mmHg - - - 18 92 % -  06/22/12 0910 90/58 mmHg - - - 13 95 % -  06/22/12 0905 91/50 mmHg - - - 18 96 % -  06/22/12 0900 - - - - 20 94 % -  06/22/12 0845 113/67 mmHg 98.9 F (37.2 C) Oral - 15 93 % -  06/22/12 0403  114/67 mmHg 97.8 F (36.6 C) Oral 81 18 94 % 96.9 kg (213 lb 10 oz)  06/21/12 2105 91/52 mmHg 98.2 F (36.8 C) Oral 90 18 94 % -     Intake/Output Summary (Last 24 hours) at 06/22/12 1612 Last data filed at 06/22/12 1407  Gross per 24 hour  Intake    680 ml  Output   1750 ml  Net  -1070 ml   Filed Weights   06/20/12 1543 06/21/12 0451 06/22/12 0403  Weight: 99.973 kg (220 lb 6.4 oz) 98.2 kg (216 lb 7.9 oz) 96.9 kg (213 lb 10 oz)    Exam:   General:  axox3  Cardiovascular: RRR  Respiratory: By basilar crackles  Abdomen: Soft nontender  Musculoskeletal: Intact   Data Reviewed: Basic Metabolic  Panel:  Recent Labs Lab 06/19/12 2328 06/20/12 0304 06/21/12 0625 06/21/12 1352  NA 139 137 143  --   K 4.2 4.0 4.1  --   CL 102 101 103  --   CO2 24 27 31   --   GLUCOSE 227* 125* 115*  --   BUN 13 14 14   --   CREATININE 1.09 1.09 1.01 0.97  CALCIUM 9.1 9.0 9.0  --    Liver Function Tests:  Recent Labs Lab 06/20/12 0304  AST 20  ALT 13  ALKPHOS 88  BILITOT 0.3  PROT 6.8  ALBUMIN 3.4*   No results found for this basename: LIPASE, AMYLASE,  in the last 168 hours No results found for this basename: AMMONIA,  in the last 168 hours CBC:  Recent Labs Lab 06/19/12 2328 06/20/12 0304 06/21/12 0625 06/21/12 1352 06/22/12 1521  WBC 27.9* 16.8* 9.9 10.7* 11.8*  NEUTROABS 16.4*  --   --   --   --   HGB 14.6 12.8 12.3 12.6 13.4  HCT 44.0 38.3 37.7 38.3 40.3  MCV 85.3 82.5 85.1 84.9 84.5  PLT 270 198 181 178 193   Cardiac Enzymes:  Recent Labs Lab 06/19/12 2328 06/20/12 0304 06/20/12 0912 06/20/12 1428  TROPONINI <0.30 <0.30 0.43* <0.30   BNP (last 3 results)  Recent Labs  06/19/12 2328  PROBNP 1050.0*   CBG: No results found for this basename: GLUCAP,  in the last 168 hours  Recent Results (from the past 240 hour(s))  URINE CULTURE     Status: None   Collection Time    06/20/12 12:17 AM      Result Value Range Status   Specimen Description URINE, CLEAN CATCH   Final   Special Requests NONE   Final   Culture  Setup Time 06/20/2012 01:27   Final   Colony Count NO GROWTH   Final   Culture NO GROWTH   Final   Report Status 06/21/2012 FINAL   Final     Studies: No results found.  Scheduled Meds:  Continuous Infusions:   Principal Problem:   CHF, acute Active Problems:   GERD   Respiratory distress   Pulmonary edema   Leucocytosis   Morbid obesity   Sinus tachycardia   Elevated troponin   Mitral valve disease   Severe mitral regurgitation   Mitral stenosis      Priscilla Stevens  [facility] Pager [phone]. If 7PM-7AM,  please contact night-coverage at [email or web address], password [email or web address] 06/22/2012, 4:12 PM  LOS: 3 days

## 2012-06-22 NOTE — Progress Notes (Signed)
  Echocardiogram Echocardiogram Transesophageal has been performed.  Priscilla Stevens 06/22/2012, 9:43 AM

## 2012-06-22 NOTE — Progress Notes (Signed)
CARDIAC REHAB PHASE I   PRE:  Rate/Rhythm: 99 SR  BP:  Supine:   Sitting: 126/59  Standing:    SaO2: 94 2L  MODE:  Ambulation: 460 ft   POST:  Rate/Rhythm: 104  BP:  Supine:   Sitting: 125/70  Standing:    SaO2: 99 RA 1430-1520 Pt tolerated ambulation well without c/o of cp or SOB.VS stable. Completed pre-op education with pt and family. Discussed sternal precautions, IS, mobility and answered their questions. Pt given pre OHS booklet and encouraged them to watch going for heart surgery video.  Melina Copa RN 06/22/2012 3:25 PM

## 2012-06-23 ENCOUNTER — Encounter (HOSPITAL_COMMUNITY): Payer: Self-pay | Admitting: Cardiology

## 2012-06-23 ENCOUNTER — Other Ambulatory Visit: Payer: Self-pay | Admitting: *Deleted

## 2012-06-23 DIAGNOSIS — R7989 Other specified abnormal findings of blood chemistry: Secondary | ICD-10-CM

## 2012-06-23 DIAGNOSIS — I4892 Unspecified atrial flutter: Secondary | ICD-10-CM

## 2012-06-23 DIAGNOSIS — I059 Rheumatic mitral valve disease, unspecified: Secondary | ICD-10-CM

## 2012-06-23 DIAGNOSIS — I5033 Acute on chronic diastolic (congestive) heart failure: Principal | ICD-10-CM

## 2012-06-23 LAB — CBC
MCH: 27.8 pg (ref 26.0–34.0)
MCHC: 33.9 g/dL (ref 30.0–36.0)
Platelets: 221 10*3/uL (ref 150–400)

## 2012-06-23 LAB — BASIC METABOLIC PANEL
BUN: 16 mg/dL (ref 6–23)
Calcium: 9.8 mg/dL (ref 8.4–10.5)
Creatinine, Ser: 0.96 mg/dL (ref 0.50–1.10)
GFR calc non Af Amer: 63 mL/min — ABNORMAL LOW (ref >90)
Glucose, Bld: 111 mg/dL — ABNORMAL HIGH (ref 70–99)

## 2012-06-23 MED ORDER — RIVAROXABAN 20 MG PO TABS
20.0000 mg | ORAL_TABLET | Freq: Every day | ORAL | Status: DC
  Filled 2012-06-23: qty 1

## 2012-06-23 MED ORDER — VENLAFAXINE HCL ER 150 MG PO CP24: 150.0000 mg | ORAL_CAPSULE | Freq: Every day | ORAL | Status: DC

## 2012-06-23 MED ORDER — METOPROLOL SUCCINATE ER 25 MG PO TB24
25.0000 mg | ORAL_TABLET | Freq: Two times a day (BID) | ORAL | Status: DC
  Administered 2012-06-23: 25 mg via ORAL
  Filled 2012-06-23 (×2): qty 1

## 2012-06-23 MED ORDER — POTASSIUM CHLORIDE 20 MEQ PO PACK: 20.0000 meq | PACK | Freq: Every day | ORAL | Status: DC

## 2012-06-23 MED ORDER — AMIODARONE HCL 200 MG PO TABS: 200.0000 mg | ORAL_TABLET | Freq: Two times a day (BID) | ORAL | Status: DC

## 2012-06-23 MED ORDER — METOPROLOL SUCCINATE ER 25 MG PO TB24: 25.0000 mg | ORAL_TABLET | Freq: Two times a day (BID) | ORAL | Status: DC

## 2012-06-23 MED ORDER — POTASSIUM CHLORIDE CRYS ER 20 MEQ PO TBCR
20.0000 meq | EXTENDED_RELEASE_TABLET | Freq: Once | ORAL | Status: AC
  Administered 2012-06-23: 20 meq via ORAL
  Filled 2012-06-23: qty 1

## 2012-06-23 MED ORDER — FUROSEMIDE 40 MG PO TABS: 40.0000 mg | ORAL_TABLET | Freq: Every day | ORAL | Status: DC

## 2012-06-23 MED ORDER — RIVAROXABAN 20 MG PO TABS: 20.0000 mg | ORAL_TABLET | Freq: Every day | ORAL | Status: DC

## 2012-06-23 MED ORDER — POTASSIUM CHLORIDE 20 MEQ PO PACK
20.0000 meq | PACK | Freq: Once | ORAL | Status: DC
  Filled 2012-06-23: qty 1

## 2012-06-23 MED ORDER — FUROSEMIDE 40 MG PO TABS
40.0000 mg | ORAL_TABLET | Freq: Every day | ORAL | Status: DC
  Administered 2012-06-23: 40 mg via ORAL
  Filled 2012-06-23: qty 1

## 2012-06-23 NOTE — Progress Notes (Signed)
Client ambulated in hallway without oxygen, osygen saturation 93, heart rate 120-123.  Some sob noted, able to verbalize, however, with minimal difficulty.  Physician and assigned nurse notified.

## 2012-06-23 NOTE — Progress Notes (Signed)
Patient ID: Priscilla Stevens, female   DOB: Oct 24, 1950, 62 y.o.   MRN: [id number]    SUBJECTIVE: No complaints this morning, no dyspnea.  Still on oxygen.  Seen by Dr. Cornelius Moras yesterday and had CT abd/pelvis.    Marland Kitchen amiodarone  200 mg Oral BID PC  . ARIPiprazole  7.5 mg Oral Daily  . atorvastatin  10 mg Oral Daily  . calcium carbonate  1 tablet Oral Daily  . furosemide  40 mg Oral Daily  . metoprolol succinate  25 mg Oral BID  . pantoprazole  40 mg Oral Daily  . potassium chloride  20 mEq Oral Once  . rivaroxaban  20 mg Oral Daily  . traZODone  100 mg Oral QHS  . venlafaxine XR  150 mg Oral Daily      Filed Vitals:   06/22/12 1022 06/22/12 1430 06/22/12 2020 06/23/12 0622  BP: 84/60 100/64 102/56 105/62  Pulse:   88 90  Temp:  98.6 F (37 C) 98.6 F (37 C) 98.2 F (36.8 C)  TempSrc:  Oral Oral Oral  Resp:  20 20 20   Height:      Weight:    214 lb 11.2 oz (97.387 kg)  SpO2:  95% 96% 92%    Intake/Output Summary (Last 24 hours) at 06/23/12 0844 Last data filed at 06/22/12 1826  Gross per 24 hour  Intake    960 ml  Output    400 ml  Net    560 ml    LABS: Basic Metabolic Panel:  Recent Labs  40/98/11 1521 06/23/12 0500  NA 136 141  K 3.9 4.1  CL 95* 102  CO2 32 30  GLUCOSE 128* 111*  BUN 16 16  CREATININE 1.13* 0.96  CALCIUM 9.9 9.8   Liver Function Tests: No results found for this basename: AST, ALT, ALKPHOS, BILITOT, PROT, ALBUMIN,  in the last 72 hours No results found for this basename: LIPASE, AMYLASE,  in the last 72 hours CBC:  Recent Labs  06/22/12 1521 06/23/12 0500  WBC 11.8* 10.7*  HGB 13.4 13.3  HCT 40.3 39.2  MCV 84.5 81.8  PLT 193 221   Cardiac Enzymes:  Recent Labs  06/20/12 0912 06/20/12 1428  TROPONINI 0.43* <0.30   BNP: No components found with this basename: POCBNP,  D-Dimer: No results found for this basename: DDIMER,  in the last 72 hours Hemoglobin A1C:  Recent Labs  06/20/12 1431  HGBA1C 5.7*   Fasting Lipid  Panel:  Recent Labs  06/21/12 0625  CHOL 133  HDL 51  LDLCALC 64  TRIG 88  CHOLHDL 2.6   Thyroid Function Tests: No results found for this basename: TSH, T4TOTAL, FREET3, T3FREE, THYROIDAB,  in the last 72 hours Anemia Panel: No results found for this basename: VITAMINB12, FOLATE, FERRITIN, TIBC, IRON, RETICCTPCT,  in the last 72 hours  RADIOLOGY: Ct Angio Chest W/cm &/or Wo Cm  06/20/2012  *RADIOLOGY REPORT*  Clinical Data: Sudden onset of shortness of breath.  CT ANGIOGRAPHY CHEST  Technique:  Multidetector CT imaging of the chest using the standard protocol during bolus administration of intravenous contrast. Multiplanar reconstructed images including MIPs were obtained and reviewed to evaluate the vascular anatomy.  Contrast: OMNIPAQUE IOHEXOL 350 MG/ML SOLN  Comparison: None.  Findings: Technically adequate study with good opacification of the central and segmental pulmonary arteries.  No focal filling defects demonstrated.  No evidence of significant pulmonary embolus.  Mild cardiac enlargement with calcification in the region of  the mitral valve.  Reflux of contrast material into the inferior vena cava suggest passive congestion.  Upper abdominal organs are otherwise unremarkable.  Enlarged lymph nodes are present in the aortopulmonic window region at 13 mm diameter and in the subcarinal region at 14 mm diameter.  Right paratracheal lymph nodes measure up to 16 mm diameter.  Pretracheal lymph nodes measure up to 17 mm diameter.  No axillary lymphadenopathy.  Esophagus is decompressed.  Small bilateral pleural effusions with basilar atelectasis.  Patchy air space disease in the lungs suggests perihilar edema.  No pneumothorax.  Airways appear patent.  Degenerative changes in the thoracic spine.  IMPRESSION: No evidence of significant pulmonary embolus.  Cardiac enlargement, bilateral pleural effusions, and perihilar airspace disease suggesting congestive failure with edema.   Lymphadenopathy in the mediastinum is of nonspecific etiology.  Consider reactive versus neoplastic lymph nodes.   Original Report Authenticated By: Burman Nieves, M.D.    Dg Chest Portable 1 View  06/20/2012  *RADIOLOGY REPORT*  Clinical Data: Severe shortness of breath.  Pulmonary edema. Cardiomegaly.  PORTABLE CHEST - 1 VIEW  Comparison: 12/27/2008  Findings: The shallow inspiration.  Cardiac enlargement with mild prominence of pulmonary vascularity.  Diffuse interstitial changes suggesting interstitial edema.  Changes are new since previous study.  No blunting of costophrenic angles.  No pneumothorax.  No focal airspace disease.  IMPRESSION: Cardiac enlargement with pulmonary vascular congestion and interstitial edema.   Original Report Authenticated By: Burman Nieves, M.D.    Ct Angio Abd/pel W/ And/or W/o  06/22/2012  *RADIOLOGY REPORT*  Clinical Data:  Preop of mitral valve replacement.  Evaluate for aorto-iliac occlusive disease.  History of chronic lymphocytic leukemia.  CT ANGIOGRAPHY ABDOMEN AND PELVIS  Technique:  Multidetector CT imaging through the abdomen and pelvis was performed using the standard protocol during bolus administration of intravenous contrast.  Multiplanar reconstructed images including MIPs were obtained and reviewed to evaluate the vascular anatomy.  Contrast: 80mL OMNIPAQUE IOHEXOL 350 MG/ML. The initial imaging sequence did not result in a good contrast bolus in the aorta. Therefore, the images were repeated, and a total of 160 ml Omnipaque 350 was utilized.  Comparison:   None.  Findings:  The contrast bolus in the abdominal aorta and the bilateral iliofemoral arteries is adequate.  Moderate calcified and noncalcified plaque in the suprarenal and infrarenal abdominal aorta without evidence of aneurysm, dissection, or stenosis. Widely patent celiac and superior mesenteric arteries.  Calcified plaque at the origin of the IMA without visible stenosis.  Single renal arteries  supplying each kidney; the right main renal artery is widely patent, and there is calcified plaque at the origin of the left main renal artery without visible stenosis. Moderate to extensive calcified plaque involving the common iliac arteries bilaterally, without evidence of hemodynamically significant stenosis.  Calcified plaque involving the common femoral arteries bilaterally without stenosis.  (Soft tissue stranding adjacent to the right common femoral artery is presumably related to recent cardiac catheterization.)  Diffuse hepatic steatosis.  Approximate 1.9 cm simple cyst in the caudate lobe of the liver; no significant focal hepatic parenchymal abnormalities.  Normal-appearing spleen allowing for the early arterial phase of enhancement.  Normal pancreas, adrenal glands, and kidneys.  No significant lymphadenopathy.  Gallbladder contracted as there is food within the stomach.  No biliary ductal dilation.  Stomach normal in appearance.  Inspissated stool-like material ovary several centimeter segment of the distal and terminal ileum. Small bowel otherwise normal in appearance.  Scattered diverticula involving the ascending  and transverse colon, with more extensive diverticulosis involving the distal descending and sigmoid colon. No evidence of acute diverticulitis.  Appendix surgically absent. No ascites.  Urinary bladder decompressed and unremarkable.  Uterus surgically absent.  No adnexal masses or free pelvic fluid.  Bone window images demonstrate mild degenerative changes involving the lower thoracic spine.  Visualized lung bases clear, though there are scattered areas of focal hyperlucency.  Heart mildly enlarged with mitral valvular calcification.  Review of the MIP images confirms the above findings.  IMPRESSION:  1.  Moderate aorto-iliofemoral atherosclerosis without aneurysm, dissection, or hemodynamically significant stenosis. 2.  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 3.  Diffuse hepatic  steatosis. 4.  Colonic diverticulosis without evidence of acute diverticulitis. 5.  Inspissated stool-like material over a long segment of the distal and terminal ileum, consistent with stasis. 6.  Scattered areas of focal hyperlucency in the visualized lung bases consistent with localized air trapping as can be seen in patients with asthma.   Original Report Authenticated By: Hulan Saas, M.D.     PHYSICAL EXAM General: NAD Neck: difficult exam but do not see JVD, no thyromegaly or thyroid nodule.  Lungs: Clear to auscultation bilaterally with normal respiratory effort. CV: Nondisplaced PMI.  Heart regular S1/S2, no S3/S4, 2/6 HSM apex.  No peripheral edema.  No carotid bruit.  Normal pedal pulses.  Abdomen: Soft, nontender, no hepatosplenomegaly, no distention.  Neurologic: Alert and oriented x 3.  Psych: Normal affect. Extremities: No clubbing or cyanosis.   TELEMETRY: Reviewed telemetry pt in sinus tachy in 100s  ASSESSMENT AND PLAN: 62 yo with history of CLL admitted with acute diastolic CHF and found to have mod-severe MR, moderate MS.   1. 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  Moderate-severe MR, moderate MS with preserved LV EF and no significant coronary disease on cath.  Volume status now looks ok on exam but still on oxygen. - Plan for MV replacement (mechanical valve) in early April Cornelius Moras).  Possible mini-thoracotomy, had CT abd/pelvis yesterday.  - Start Lasix 40 mg daily + KCl 20 mEq daily.  - Still on oxygen => take off and walk her, checking sats. 2. Atrial flutter: Suspect atrial flutter at admission, now sinus.  Significant stroke risk long-term.  - Amiodarone 200 bid to keep in NSR around time of surgery.  - Will change to Toprol XL 25 bid.  - She will be on Xarelto 20 mg daily, will need to hold this about 3 days prior to surgery.  3. If sats are ok off oxygen, could go home on current cardiac meds.  Will need followup with me in 1 week.  Will need BMET, LFTs,  TSH, BNP, CBC at that time.    Marca Ancona 06/23/2012 8:50 AM

## 2012-06-23 NOTE — Progress Notes (Signed)
In to speak with pt. About home health services.  Pt. Is interested in having home health, and after looking at list of home health agency list, pt. Chose [facility].  TC To Lupita Leash, with [facility], to give referral for [facility] RN for Heart Failure Management.  Pt. To be discharged home today. Tera Mater, RN, BSN NCM [phone]

## 2012-06-23 NOTE — Progress Notes (Signed)
Pt given DC instructions and verbalized understanding.  Pt DC home via wc.  

## 2012-06-23 NOTE — Discharge Summary (Signed)
Physician Discharge Summary  IMAGINE NEST ZOX:[id number] DOB: Sep 07, 1950 DOA: 06/19/2012  PCP: [name], MD  Admit date: 06/19/2012 Discharge date: 06/23/2012  Time spent: 45 minutes  Recommendations for Outpatient Follow-up:  Follow up with [place] [facility], Dr. Shirlee Latch in 1 week Follow up with [facility], Dr. Cornelius Moras on 07/04/12 Follow up with primary care doctor in 2 weeks Patient has been set up with home health RN for disease management.  Discharge Diagnoses:  Principal Problem:   Acute on chronic diastolic heart failure Active Problems:   GERD   Respiratory distress   Pulmonary edema   Leucocytosis   Morbid obesity   Sinus tachycardia   CHF, acute   Elevated troponin   Mitral valve disease   Severe mitral regurgitation   Mitral stenosis   Discharge Condition: improved  Diet recommendation: low salt  Filed Weights   06/21/12 0451 06/22/12 0403 06/23/12 0622  Weight: 98.2 kg (216 lb 7.9 oz) 96.9 kg (213 lb 10 oz) 97.387 kg (214 lb 11.2 oz)    History of present illness:  A 62 year old female with known history of migraine headaches and fibromyalgia who presented with sudden onset of shortness of breath today. She had an episode of bad yesterday but it went away. She felt like she was going to choke. Brought into the ED where she was found to be more or less struggling to breathe. She was having sinus tachycardia with a rate as high as 150. She received 2 separate doses of adenosine before her heart rate was controlled to about 100-110 per minute. She had no prior cardiac history, no chest pain, no nausea vomiting or diarrhea, no diaphoresis. Patient has had some cough but unable to bring anything up. She has high blood pressure on arrival to the EEG however after initial treatment her blood pressure is currently low at 86/64. She denied fever or chills denied any sick contacts. Her initial findings indicate possible pulmonary edema   Hospital Course:   1. Acute  respiratory failure - secondary to acute congestive heart failure. Improved after IV furosemide, oxygen therapy. Did not require BiPAP. Currently breathing comfortably on room air.   2. Acute on chronic diastolic CHF due to mitral valve disease - patient has never had hypertension, ischemic heart disease. She denies an acute viral illness recently. Echocardiogram with significant mitral regurgitation . Patient was given IV furosemide on admission with improvement in her volume status. TSH 1.812. She has been started on a beta blocker and statin.  LHC did not indicate any evidence of CAD. TEE indicated severe mitral regurgitation. She was seen by [facility], Dr. Cornelius Moras, and plans are to follow up for mitral valve surgery.  3. Leukocytosis with hx of CLL - improved-suspect some of this was demargination as it resolved by 06/21/12 - no further w/u.   4. positive troponin without chest pain - probably secondary to decompensated CHF. LHC 06/21/12 without CAD.   5. tachycardia in the emergency room-presumed SVT-treated with adenosine unsuccessfully. In retrospect probably patient had atrial flutter . Started beta blocker on March 17.  Also started on amiodarone to prevent any arrhythmias at the time of surgery.  She is also on Xarelto for anticoagulation.  This will need to stopped at least 3 days before surgery.   6. Depression and anxiety - continue home medications. Since patient had significant tachycardia, her dose of Effexor was decreased to 150mg  daily.  Patient was ambulated on room air today and has done quite well.  She is  not significantly short of breath.  She has been cleared for discharge by cardiology.  Procedures: Echo  - Left ventricle: The cavity size was normal. Wall thickness was normal. Systolic function was normal. The estimated ejection fraction was in the range of 55% to 60%. Regional wall motion abnormalities cannot be excluded. - Aortic valve: Trivial regurgitation. - Mitral valve:  Calcified annulus. Moderate to severe regurgitation. Valve area by pressure half-time: 1.81cm^2. Valve area by continuity equation (using LVOT flow): 0.67cm^2. 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  Cardiac Cath 3/18:No angiographic coronary disease. Moderate to severe mitral regurgitation, probably moderate mitral stenosis.  TEE 3/19: Moderate to severe MR, moderate mitral stenosis.   Consultations:  [facility] [facility], Dr. Edwin Dada, Dr. Cornelius Moras  Discharge Exam: Filed Vitals:   06/22/12 1430 06/22/12 2020 06/23/12 0622 06/23/12 1028  BP: 100/64 102/56 105/62 115/66  Pulse:  88 90 91  Temp: 98.6 F (37 C) 98.6 F (37 C) 98.2 F (36.8 C)   TempSrc: Oral Oral Oral   Resp: 20 20 20    Height:      Weight:   97.387 kg (214 lb 11.2 oz)   SpO2: 95% 96% 92%     General: NAD Cardiovascular: S1, S2, RRR Respiratory: CTA B  Discharge Instructions  Discharge Orders   Future Orders Complete By Expires     (HEART FAILURE PATIENTS) Call MD:  Anytime you have any of the following symptoms: 1) 3 pound weight gain in 24 hours or 5 pounds in 1 week 2) shortness of breath, with or without a dry hacking cough 3) swelling in the hands, feet or stomach 4) if you have to sleep on extra pillows at night in order to breathe.  As directed     Diet - low sodium heart healthy  As directed     Face-to-face encounter (required for Medicare/Medicaid patients)  As directed     Comments:      I Gwynevere Lizana certify that this patient is under my care and that I, or a nurse practitioner or physician's assistant working with me, had a face-to-face encounter that meets the physician face-to-face encounter requirements with this patient on 06/23/2012. The encounter with the patient was in whole, or in part for the following medical condition(s)  which is the primary reason for home health care (List medical condition): Patient admitted with shortness of breath due to CHF exacerbation. She will benefit from home health RN to assist in disease management.    Questions:      The encounter with the patient was in whole, or in part, for the following medical condition, which is the primary reason for home health care:  chf exacerbation    I certify that, based on my findings, the following services are medically necessary home health services:  Nursing    My clinical findings support the need for the above services:  Shortness of breath with activity    Further, I certify that my clinical findings support that this patient is homebound due to:  Shortness of Breath with activity    Reason for Medically Necessary Home Health Services:  Skilled Nursing- Teaching of Disease Process/Symptom Management    Home Health  As directed     Questions:      To provide the following care/treatments:  RN    Increase  activity slowly  As directed         Medication List    STOP taking these medications       Venlafaxine HCl 225 MG Tb24  Replaced by:  venlafaxine XR 150 MG 24 hr capsule      TAKE these medications       ABILIFY 5 MG tablet  Generic drug:  ARIPiprazole  Take 7.5 mg by mouth daily.     amiodarone 200 MG tablet  Commonly known as:  PACERONE  Take 1 tablet (200 mg total) by mouth 2 (two) times daily after a meal.     atorvastatin 10 MG tablet  Commonly known as:  LIPITOR  Take 10 mg by mouth daily.     calcium carbonate 600 MG Tabs  Commonly known as:  OS-CAL  Take 600 mg by mouth daily.     furosemide 40 MG tablet  Commonly known as:  LASIX  Take 1 tablet (40 mg total) by mouth daily.     HYDROcodone-acetaminophen 5-325 MG per tablet  Commonly known as:  NORCO/VICODIN  Take 1 tablet by mouth every 6 (six) hours as needed for pain.     metoprolol succinate 25 MG 24 hr tablet  Commonly known as:  TOPROL-XL  Take 1  tablet (25 mg total) by mouth 2 (two) times daily.     potassium chloride 20 MEQ packet  Commonly known as:  KLOR-CON  Take 20 mEq by mouth daily.     PREVACID PO  Take 1 tablet by mouth daily. OTC     Rivaroxaban 20 MG Tabs  Commonly known as:  XARELTO  Take 1 tablet (20 mg total) by mouth daily with supper.     traMADol 50 MG tablet  Commonly known as:  ULTRAM  Take 50 mg by mouth every 8 (eight) hours as needed for pain.     traZODone 100 MG tablet  Commonly known as:  DESYREL  Take 100 mg by mouth at bedtime.     venlafaxine XR 150 MG 24 hr capsule  Commonly known as:  EFFEXOR-XR  Take 1 capsule (150 mg total) by mouth daily.           Follow-up Information   Follow up with BADGER,MICHAEL C, MD. Schedule an appointment as soon as possible for a visit in 2 weeks.   Contact information:   [address] [place] [address] [phone]       Follow up with Marca Ancona, MD. Schedule an appointment as soon as possible for a visit in 1 week.   Contact information:   [address] [place] [address] [phone]       Follow up with Purcell Nails, MD. (07/04/12)    Contact information:   [address] [place] [address] [phone]        The results of significant diagnostics from this hospitalization (including imaging, microbiology, ancillary and laboratory) are listed below for reference.    Significant Diagnostic Studies: Ct Angio Chest W/cm &/or Wo Cm  06/20/2012  *RADIOLOGY REPORT*  Clinical Data: Sudden onset of shortness of breath.  CT ANGIOGRAPHY CHEST  Technique:  Multidetector CT imaging of the chest using the standard protocol during bolus administration of intravenous contrast. Multiplanar reconstructed images including MIPs were obtained and reviewed to evaluate the vascular anatomy.  Contrast: OMNIPAQUE IOHEXOL 350 MG/ML SOLN  Comparison: None.  Findings: Technically  adequate study  with good opacification of the central and segmental pulmonary arteries.  No focal filling defects demonstrated.  No evidence of significant pulmonary embolus.  Mild cardiac enlargement with calcification in the region of the mitral valve.  Reflux of contrast material into the inferior vena cava suggest passive congestion.  Upper abdominal organs are otherwise unremarkable.  Enlarged lymph nodes are present in the aortopulmonic window region at 13 mm diameter and in the subcarinal region at 14 mm diameter.  Right paratracheal lymph nodes measure up to 16 mm diameter.  Pretracheal lymph nodes measure up to 17 mm diameter.  No axillary lymphadenopathy.  Esophagus is decompressed.  Small bilateral pleural effusions with basilar atelectasis.  Patchy air space disease in the lungs suggests perihilar edema.  No pneumothorax.  Airways appear patent.  Degenerative changes in the thoracic spine.  IMPRESSION: No evidence of significant pulmonary embolus.  Cardiac enlargement, bilateral pleural effusions, and perihilar airspace disease suggesting congestive failure with edema.  Lymphadenopathy in the mediastinum is of nonspecific etiology.  Consider reactive versus neoplastic lymph nodes.   Original Report Authenticated By: Burman Nieves, M.D.    Dg Chest Portable 1 View  06/20/2012  *RADIOLOGY REPORT*  Clinical Data: Severe shortness of breath.  Pulmonary edema. Cardiomegaly.  PORTABLE CHEST - 1 VIEW  Comparison: 12/27/2008  Findings: The shallow inspiration.  Cardiac enlargement with mild prominence of pulmonary vascularity.  Diffuse interstitial changes suggesting interstitial edema.  Changes are new since previous study.  No blunting of costophrenic angles.  No pneumothorax.  No focal airspace disease.  IMPRESSION: Cardiac enlargement with pulmonary vascular congestion and interstitial edema.   Original Report Authenticated By: Burman Nieves, M.D.    Ct Angio Abd/pel W/ And/or W/o  06/22/2012   *RADIOLOGY REPORT*  Clinical Data:  Preop of mitral valve replacement.  Evaluate for aorto-iliac occlusive disease.  History of chronic lymphocytic leukemia.  CT ANGIOGRAPHY ABDOMEN AND PELVIS  Technique:  Multidetector CT imaging through the abdomen and pelvis was performed using the standard protocol during bolus administration of intravenous contrast.  Multiplanar reconstructed images including MIPs were obtained and reviewed to evaluate the vascular anatomy.  Contrast: 80mL OMNIPAQUE IOHEXOL 350 MG/ML. The initial imaging sequence did not result in a good contrast bolus in the aorta. Therefore, the images were repeated, and a total of 160 ml Omnipaque 350 was utilized.  Comparison:   None.  Findings:  The contrast bolus in the abdominal aorta and the bilateral iliofemoral arteries is adequate.  Moderate calcified and noncalcified plaque in the suprarenal and infrarenal abdominal aorta without evidence of aneurysm, dissection, or stenosis. Widely patent celiac and superior mesenteric arteries.  Calcified plaque at the origin of the IMA without visible stenosis.  Single renal arteries supplying each kidney; the right main renal artery is widely patent, and there is calcified plaque at the origin of the left main renal artery without visible stenosis. Moderate to extensive calcified plaque involving the common iliac arteries bilaterally, without evidence of hemodynamically significant stenosis.  Calcified plaque involving the common femoral arteries bilaterally without stenosis.  (Soft tissue stranding adjacent to the right common femoral artery is presumably related to recent cardiac catheterization.)  Diffuse hepatic steatosis.  Approximate 1.9 cm simple cyst in the caudate lobe of the liver; no significant focal hepatic parenchymal abnormalities.  Normal-appearing spleen allowing for the early arterial phase of enhancement.  Normal pancreas, adrenal glands, and kidneys.  No significant lymphadenopathy.   Gallbladder contracted as there is food within the stomach.  No biliary  ductal dilation.  Stomach normal in appearance.  Inspissated stool-like material ovary several centimeter segment of the distal and terminal ileum. Small bowel otherwise normal in appearance.  Scattered diverticula involving the ascending and transverse colon, with more extensive diverticulosis involving the distal descending and sigmoid colon. No evidence of acute diverticulitis.  Appendix surgically absent. No ascites.  Urinary bladder decompressed and unremarkable.  Uterus surgically absent.  No adnexal masses or free pelvic fluid.  Bone window images demonstrate mild degenerative changes involving the lower thoracic spine.  Visualized lung bases clear, though there are scattered areas of focal hyperlucency.  Heart mildly enlarged with mitral valvular calcification.  Review of the MIP images confirms the above findings.  IMPRESSION:  1.  Moderate aorto-iliofemoral atherosclerosis without aneurysm, dissection, or hemodynamically significant stenosis. 2.  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 3.  Diffuse hepatic steatosis. 4.  Colonic diverticulosis without evidence of acute diverticulitis. 5.  Inspissated stool-like material over a long segment of the distal and terminal ileum, consistent with stasis. 6.  Scattered areas of focal hyperlucency in the visualized lung bases consistent with localized air trapping as can be seen in patients with asthma.   Original Report Authenticated By: Hulan Saas, M.D.     Microbiology: Recent Results (from the past 240 hour(s))  URINE CULTURE     Status: None   Collection Time    06/20/12 12:17 AM      Result Value Range Status   Specimen Description URINE, CLEAN CATCH   Final   Special Requests NONE   Final   Culture  Setup Time 06/20/2012 01:27   Final   Colony Count NO GROWTH   Final   Culture NO GROWTH   Final   Report Status 06/21/2012 FINAL   Final     Labs: Basic Metabolic  Panel:  Recent Labs Lab 06/19/12 2328 06/20/12 0304 06/21/12 0625 06/21/12 1352 06/22/12 1521 06/23/12 0500  NA 139 137 143  --  136 141  K 4.2 4.0 4.1  --  3.9 4.1  CL 102 101 103  --  95* 102  CO2 24 27 31   --  32 30  GLUCOSE 227* 125* 115*  --  128* 111*  BUN 13 14 14   --  16 16  CREATININE 1.09 1.09 1.01 0.97 1.13* 0.96  CALCIUM 9.1 9.0 9.0  --  9.9 9.8   Liver Function Tests:  Recent Labs Lab 06/20/12 0304  AST 20  ALT 13  ALKPHOS 88  BILITOT 0.3  PROT 6.8  ALBUMIN 3.4*   No results found for this basename: LIPASE, AMYLASE,  in the last 168 hours No results found for this basename: AMMONIA,  in the last 168 hours CBC:  Recent Labs Lab 06/19/12 2328 06/20/12 0304 06/21/12 0625 06/21/12 1352 06/22/12 1521 06/23/12 0500  WBC 27.9* 16.8* 9.9 10.7* 11.8* 10.7*  NEUTROABS 16.4*  --   --   --   --   --   HGB 14.6 12.8 12.3 12.6 13.4 13.3  HCT 44.0 38.3 37.7 38.3 40.3 39.2  MCV 85.3 82.5 85.1 84.9 84.5 81.8  PLT 270 198 181 178 193 221   Cardiac Enzymes:  Recent Labs Lab 06/19/12 2328 06/20/12 0304 06/20/12 0912 06/20/12 1428  TROPONINI <0.30 <0.30 0.43* <0.30   BNP: BNP (last 3 results)  Recent Labs  06/19/12 2328  PROBNP 1050.0*   CBG: No results found for this basename: GLUCAP,  in the last 168 hours     Signed:  Takeesha Isley  [facility] 06/23/2012, 11:39 AM

## 2012-06-23 NOTE — Progress Notes (Signed)
CARDIAC REHAB PHASE I   PRE:  Rate/Rhythm: 93SR  BP:  Supine:   Sitting: 125/60  Standing:    SaO2: 95%RA  MODE:  Ambulation: 460 ft   POST:  Rate/Rhythm: 113  BP:  Supine:   Sitting: 130/57  Standing:    SaO2: 93-94%RA whole walk. [id number] Pt walked 460 ft with steady gait. Tolerated well. Sats monitored whole walk and maintained 93-94%. Pt has not watched preop video yet so I put it on for her to view. Has IS. Encouraged her to use at home.   Luetta Nutting, RN BSN  06/23/2012 9:20 AM

## 2012-06-27 ENCOUNTER — Encounter (HOSPITAL_COMMUNITY): Payer: Self-pay | Admitting: Pharmacy Technician

## 2012-06-30 ENCOUNTER — Telehealth: Payer: Self-pay | Admitting: Cardiology

## 2012-06-30 NOTE — Telephone Encounter (Signed)
Pt is advised to contact her PCP, she verbalized understanding.

## 2012-06-30 NOTE — Telephone Encounter (Signed)
New Prob   Pt states she has blood in her urine this morning. Concerned and would like to speak to nurse.

## 2012-07-01 ENCOUNTER — Ambulatory Visit (INDEPENDENT_AMBULATORY_CARE_PROVIDER_SITE_OTHER): Payer: Medicare Other | Admitting: Nurse Practitioner

## 2012-07-01 ENCOUNTER — Encounter: Payer: Self-pay | Admitting: Nurse Practitioner

## 2012-07-01 VITALS — BP 112/80 | HR 66 | Ht 64.0 in | Wt 219.0 lb

## 2012-07-01 DIAGNOSIS — I5032 Chronic diastolic (congestive) heart failure: Secondary | ICD-10-CM

## 2012-07-01 MED ORDER — POTASSIUM CHLORIDE CRYS ER 20 MEQ PO TBCR: 20.0000 meq | EXTENDED_RELEASE_TABLET | Freq: Every day | ORAL | Status: DC

## 2012-07-01 NOTE — Patient Instructions (Addendum)
Stay on your current medicines but I have switched the potassium to the pill form and not the packet form  We will see you following your surgery  Call the [facility] office at [phone] if you have any questions, problems or concerns.

## 2012-07-01 NOTE — Progress Notes (Signed)
Priscilla Stevens Date of Birth: 27-Dec-1950 Medical Record #[id number]  History of Present Illness: [name] is seen back today for a post hospital visit. She is seen for Dr. Shirlee Latch. She has a history of diastolic heart failure, GERD, morbid obesity, rheumatic mitral valve disease, COPD with past tobacco use, CLL, anxiety, and fibromyalgia.   Was most recently admitted with acute on chronic diastolic heart failure. Felt to have rheumatic mitral valve disease with at least moderate mitral stenosis and moderate to severe MR. Has been seen by Dr. Cornelius Moras with plans for elective MV repair or replacement surgery next week. Amiodarone was to be started pre op to decrease her risk of perioperative atrial arrhythmias.   She comes in today. She is here with her daughter. She is doing ok. Feels better since her hospitalization. Breathing is ok. Less palpitations. Has some belching and burping but otherwise feels good. Not lightheaded or dizzy. No swelling. Thinks her palpitations may be a bit better with the addition of amiodarone.   Current Outpatient Prescriptions on File Prior to Visit  Medication Sig Dispense Refill  . amiodarone (PACERONE) 200 MG tablet Take 1 tablet (200 mg total) by mouth 2 (two) times daily after a meal.  60 tablet  1  . ARIPiprazole (ABILIFY) 5 MG tablet Take 7.5 mg by mouth daily.      Marland Kitchen atorvastatin (LIPITOR) 10 MG tablet Take 10 mg by mouth daily.      . calcium carbonate (OS-CAL) 600 MG TABS Take 600 mg by mouth daily.      . furosemide (LASIX) 40 MG tablet Take 1 tablet (40 mg total) by mouth daily.  30 tablet  1  . HYDROcodone-acetaminophen (NORCO/VICODIN) 5-325 MG per tablet Take 1 tablet by mouth every 6 (six) hours as needed for pain.      . Lansoprazole (PREVACID PO) Take 1 tablet by mouth daily. OTC      . metoprolol succinate (TOPROL-XL) 25 MG 24 hr tablet Take 1 tablet (25 mg total) by mouth 2 (two) times daily.  60 tablet  1  . Rivaroxaban (XARELTO) 20 MG  TABS Take 1 tablet (20 mg total) by mouth daily with supper.  30 tablet  1  . traMADol (ULTRAM) 50 MG tablet Take 50 mg by mouth every 8 (eight) hours as needed for pain.      . traZODone (DESYREL) 100 MG tablet Take 100 mg by mouth at bedtime.      Marland Kitchen venlafaxine XR (EFFEXOR-XR) 150 MG 24 hr capsule Take 1 capsule (150 mg total) by mouth daily.  30 capsule  1   No current facility-administered medications on file prior to visit.    Allergies  Allergen Reactions  . Aspirin     REACTION: GI upset    Past Medical History  Diagnosis Date  . Fibromyalgia   . Anxiety   . Pneumonia   . CLL (chronic lymphocytic leukemia)   . Cough     Eval by pulm in past: essentially normal PFTs, cough ddx was variant asthma, upper airway cough syndrome (previously termed post nasal drip syndrome) or GERD,   . Diverticulitis   . Severe mitral regurgitation 06/20/2012  . Mitral stenosis 06/20/2012  . Morbid obesity 06/20/2012  . History of tobacco abuse 06/22/2012  . Acute on chronic diastolic heart failure 06/22/2012    Past Surgical History  Procedure Laterality Date  . Appendectomy    . Total vaginal hysterectomy    . Tonsillectomy    .  Tee without cardioversion N/A 06/22/2012    Procedure: TRANSESOPHAGEAL ECHOCARDIOGRAM (TEE);  Surgeon: Laurey Morale, MD;  Location: [facility];  Service: Cardiovascular;  Laterality: N/A;    History  Smoking status  . Former Smoker -- 1.00 packs/day for 30 years  . Quit date: 04/06/2012  Smokeless tobacco  . Not on file    Comment: Quit January 2014. Smoked for 25 yrs.    History  Alcohol Use  . 0.0 oz/week    Comment: 2x/year    Family History  Problem Relation Age of Onset  . Congestive Heart Failure Mother     Review of Systems: The review of systems is per the HPI.  All other systems were reviewed and are negative.  Physical Exam: BP 112/80  Pulse 66  Ht 5\' 4"  (1.626 m)  Wt 219 lb (99.338 kg)  BMI 37.57 kg/m2 Patient is very pleasant  and in no acute distress. Skin is warm and dry. Color is normal.  HEENT is unremarkable. Normocephalic/atraumatic. PERRL. Sclera are nonicteric. Neck is supple. No masses. No JVD. Lungs are clear. Cardiac exam shows a regular rate and rhythm. Abdomen is soft. Extremities are without edema. Gait and ROM are intact. No gross neurologic deficits noted.  LABORATORY DATA:  Lab Results  Component Value Date   WBC 10.7* 06/23/2012   HGB 13.3 06/23/2012   HCT 39.2 06/23/2012   PLT 221 06/23/2012   GLUCOSE 111* 06/23/2012   CHOL 133 06/21/2012   TRIG 88 06/21/2012   HDL 51 06/21/2012   LDLCALC 64 06/21/2012   ALT 13 06/20/2012   AST 20 06/20/2012   NA 141 06/23/2012   K 4.1 06/23/2012   CL 102 06/23/2012   CREATININE 0.96 06/23/2012   BUN 16 06/23/2012   CO2 30 06/23/2012   TSH 1.812 06/20/2012   INR 0.97 06/21/2012   HGBA1C 5.7* 06/20/2012   Echo Study Conclusions  - Left ventricle: The cavity size was normal. Wall thickness was increased in a pattern of mild LVH. Systolic function was normal. The estimated ejection fraction was in the range of 60% to 65%. Wall motion was normal; there were no regional wall motion abnormalities. - Aortic valve: There was no stenosis. - Aorta: Normal caliber aorta with grade III plaque in the aortic arch. - Mitral valve: The mitral valve and apparatus (particularly the posterior leaflet) were heavily calcified. The anterior leaflet had a hockey stick appearance suggestive of rheumatic valve disease. There was moderate to severe mitral regurgitation and moderate mitral stenosis. Mean gradient: 13mm Hg (D). Peak gradient: 20mm Hg (D). Valve area by pressure half-time: 1.35cm^2. - Left atrium: The atrium was moderately dilated. No evidence of thrombus in the atrial cavity or appendage. - Pulmonary veins: Systolic flow reversal on the pulmonary vein doppler pattern was not noted. - Right ventricle: The cavity size was normal. Systolic function was normal. - Right  atrium: No evidence of thrombus in the atrial cavity or appendage. - Atrial septum: There was increased thickness of the septum, consistent with lipomatous hypertrophy. No defect or patent foramen ovale was identified. Echo contrast study showed no right-to-left atrial level shunt, at baseline or with provocation.  Coronary angiography:   Left mainstem: No significant disease.  Left anterior descending (LAD): No significant disease.  Left circumflex (LCx): 30% ostial LCx.  Right coronary artery (RCA): No significant disease.   Left ventriculography: Left ventricular systolic function is normal, LVEF is estimated at 60-65% with no wall motion abnormalities in the RAO projection.  3+ mitral regurgitation.   Final Conclusions: No angiographic coronary disease. Moderate to severe mitral regurgitation, probably moderate mitral stenosis. Awaiting TEE. Will consult CVTS for evaluation. Will continue current IV Lasix dosing today.   Marca Ancona  06/21/2012, 11:45 AM    Assessment / Plan: 1. Diastolic heart failure - looks compensated  2. Rheumatic MV disease - for surgery next week with Dr. Cornelius Moras - stopping her Xarelto 3 days prior to surgery.   3. Tachycardia - probable atrial flutter - now on beta blocker and amiodarone. She notes improvement in her palpitations.  4. CLL  Patient is agreeable to this plan and will call if any problems develop in the interim.   Rosalio Macadamia, RN, ANP-C [facility] [address] [place], [place]  [address]

## 2012-07-04 ENCOUNTER — Encounter (HOSPITAL_COMMUNITY)
Admission: RE | Admit: 2012-07-04 | Discharge: 2012-07-04 | Disposition: A | Discharge status: Home / Self Care | Payer: Medicare Other | Source: Ambulatory Visit | Attending: Thoracic Surgery (Cardiothoracic Vascular Surgery) | Admitting: Thoracic Surgery (Cardiothoracic Vascular Surgery)

## 2012-07-04 ENCOUNTER — Encounter: Payer: Self-pay | Admitting: Thoracic Surgery (Cardiothoracic Vascular Surgery)

## 2012-07-04 ENCOUNTER — Ambulatory Visit (INDEPENDENT_AMBULATORY_CARE_PROVIDER_SITE_OTHER): Payer: Medicare Other | Admitting: Thoracic Surgery (Cardiothoracic Vascular Surgery)

## 2012-07-04 ENCOUNTER — Ambulatory Visit (HOSPITAL_COMMUNITY)
Admission: RE | Admit: 2012-07-04 | Discharge: 2012-07-04 | Disposition: A | Discharge status: Home / Self Care | Payer: Medicare Other | Source: Ambulatory Visit | Attending: Thoracic Surgery (Cardiothoracic Vascular Surgery) | Admitting: Thoracic Surgery (Cardiothoracic Vascular Surgery)

## 2012-07-04 ENCOUNTER — Encounter (HOSPITAL_COMMUNITY): Payer: Self-pay

## 2012-07-04 VITALS — BP 114/69 | HR 76 | Resp 20 | Ht 65.0 in | Wt 216.0 lb

## 2012-07-04 VITALS — BP 102/70 | HR 75 | Temp 98.6°F | Resp 20 | Ht 65.0 in | Wt 216.7 lb

## 2012-07-04 DIAGNOSIS — K219 Gastro-esophageal reflux disease without esophagitis: Secondary | ICD-10-CM | POA: Diagnosis present

## 2012-07-04 DIAGNOSIS — K5730 Diverticulosis of large intestine without perforation or abscess without bleeding: Secondary | ICD-10-CM | POA: Diagnosis present

## 2012-07-04 DIAGNOSIS — Z87891 Personal history of nicotine dependence: Secondary | ICD-10-CM

## 2012-07-04 DIAGNOSIS — I059 Rheumatic mitral valve disease, unspecified: Secondary | ICD-10-CM | POA: Insufficient documentation

## 2012-07-04 DIAGNOSIS — I509 Heart failure, unspecified: Secondary | ICD-10-CM | POA: Insufficient documentation

## 2012-07-04 DIAGNOSIS — I5033 Acute on chronic diastolic (congestive) heart failure: Secondary | ICD-10-CM | POA: Diagnosis present

## 2012-07-04 DIAGNOSIS — Z01818 Encounter for other preprocedural examination: Secondary | ICD-10-CM | POA: Insufficient documentation

## 2012-07-04 DIAGNOSIS — E876 Hypokalemia: Secondary | ICD-10-CM | POA: Diagnosis not present

## 2012-07-04 DIAGNOSIS — D62 Acute posthemorrhagic anemia: Secondary | ICD-10-CM | POA: Diagnosis not present

## 2012-07-04 DIAGNOSIS — I050 Rheumatic mitral stenosis: Principal | ICD-10-CM | POA: Diagnosis present

## 2012-07-04 DIAGNOSIS — Z01810 Encounter for preprocedural cardiovascular examination: Secondary | ICD-10-CM

## 2012-07-04 DIAGNOSIS — C9110 Chronic lymphocytic leukemia of B-cell type not having achieved remission: Secondary | ICD-10-CM | POA: Diagnosis present

## 2012-07-04 DIAGNOSIS — I340 Nonrheumatic mitral (valve) insufficiency: Secondary | ICD-10-CM

## 2012-07-04 DIAGNOSIS — IMO0001 Reserved for inherently not codable concepts without codable children: Secondary | ICD-10-CM | POA: Diagnosis present

## 2012-07-04 DIAGNOSIS — Z01812 Encounter for preprocedural laboratory examination: Secondary | ICD-10-CM | POA: Insufficient documentation

## 2012-07-04 DIAGNOSIS — F411 Generalized anxiety disorder: Secondary | ICD-10-CM | POA: Diagnosis present

## 2012-07-04 HISTORY — DX: Other complications of anesthesia, initial encounter: T88.59XA

## 2012-07-04 HISTORY — DX: Cardiac arrhythmia, unspecified: I49.9

## 2012-07-04 HISTORY — DX: Other specified postprocedural states: R11.2

## 2012-07-04 HISTORY — DX: Gastro-esophageal reflux disease without esophagitis: K21.9

## 2012-07-04 HISTORY — DX: Headache: R51

## 2012-07-04 HISTORY — DX: Cardiac murmur, unspecified: R01.1

## 2012-07-04 HISTORY — DX: Other specified postprocedural states: Z98.890

## 2012-07-04 HISTORY — DX: Adverse effect of unspecified anesthetic, initial encounter: T41.45XA

## 2012-07-04 HISTORY — DX: Heart failure, unspecified: I50.9

## 2012-07-04 LAB — URINALYSIS, ROUTINE W REFLEX MICROSCOPIC
Nitrite: NEGATIVE
Protein, ur: NEGATIVE mg/dL
Specific Gravity, Urine: 1.024 (ref 1.005–1.030)
Urobilinogen, UA: 0.2 mg/dL (ref 0.0–1.0)

## 2012-07-04 LAB — COMPREHENSIVE METABOLIC PANEL
ALT: 15 U/L (ref 0–35)
AST: 25 U/L (ref 0–37)
CO2: 21 mEq/L (ref 19–32)
Calcium: 9.6 mg/dL (ref 8.4–10.5)
Chloride: 100 mEq/L (ref 96–112)
Creatinine, Ser: 1.04 mg/dL (ref 0.50–1.10)
GFR calc Af Amer: 67 mL/min — ABNORMAL LOW (ref >90)
GFR calc non Af Amer: 58 mL/min — ABNORMAL LOW (ref >90)
Glucose, Bld: 111 mg/dL — ABNORMAL HIGH (ref 70–99)
Sodium: 137 mEq/L (ref 135–145)
Total Bilirubin: 0.2 mg/dL — ABNORMAL LOW (ref 0.3–1.2)

## 2012-07-04 LAB — CBC
Hemoglobin: 13.6 g/dL (ref 12.0–15.0)
MCH: 28 pg (ref 26.0–34.0)
MCV: 81.9 fL (ref 78.0–100.0)
Platelets: 222 10*3/uL (ref 150–400)
RBC: 4.86 MIL/uL (ref 3.87–5.11)
WBC: 13.7 10*3/uL — ABNORMAL HIGH (ref 4.0–10.5)

## 2012-07-04 LAB — BLOOD GAS, ARTERIAL
Acid-Base Excess: 2.2 mmol/L — ABNORMAL HIGH (ref 0.0–2.0)
Bicarbonate: 26 mEq/L — ABNORMAL HIGH (ref 20.0–24.0)
Drawn by: 206361
O2 Saturation: 97.5 %
pCO2 arterial: 38.1 mmHg (ref 35.0–45.0)
pO2, Arterial: 89.4 mmHg (ref 80.0–100.0)

## 2012-07-04 LAB — URINE MICROSCOPIC-ADD ON

## 2012-07-04 LAB — SURGICAL PCR SCREEN: MRSA, PCR: NEGATIVE

## 2012-07-04 MED ORDER — ALPRAZOLAM 0.25 MG PO TABS: 0.2500 mg | ORAL_TABLET | Freq: Every evening | ORAL | Status: DC | PRN

## 2012-07-04 NOTE — Progress Notes (Signed)
[address]            [name] [address]          [phone]     CARDIOTHORACIC SURGERY OFFICE NOTE  Referring Provider is Laurey Morale, MD PCP is [name], MD   HPI:  Patient returns for followup of mitral stenosis and mitral regurgitation.  She was hospitalized 2 weeks ago with acute exacerbation of chronic diastolic congestive heart failure with pulmonary edema and bilateral pleural effusions.  She was found to have moderate to severe mitral stenosis and moderate to severe mitral regurgitation with likely underlying rheumatic mitral valve disease. Cardiac catheterization was notable for the absence of significant coronary artery disease.  She responded well to medical therapy and I had the opportunity to see her in consultation on 06/22/2012 to discuss possible elective surgical intervention.  She returns to the office today with tentatively plans to proceed with elective mitral valve replacement later this week.  She reports no new problems since she was discharged from the hospital. She notes that her breathing is much better than it had been previously. She is having less palpitations. She denies resting shortness of breath, PND, orthopnea, or dizzy spells, nor syncope. She has never had any chest pain. She has chronic exertional shortness of breath.  She has been taking Xarelto.   Current Outpatient Prescriptions  Medication Sig Dispense Refill  . amiodarone (PACERONE) 200 MG tablet Take 1 tablet (200 mg total) by mouth 2 (two) times daily after a meal.  60 tablet  1  . ARIPiprazole (ABILIFY) 5 MG tablet Take 7.5 mg by mouth daily.      Marland Kitchen atorvastatin (LIPITOR) 10 MG tablet Take 10 mg by mouth daily.      . calcium carbonate (OS-CAL) 600 MG TABS Take 600 mg by mouth daily.      . furosemide (LASIX) 40 MG tablet Take 1 tablet (40 mg total) by mouth daily.  30 tablet  1  . HYDROcodone-acetaminophen (NORCO/VICODIN) 5-325 MG per tablet Take  1 tablet by mouth every 6 (six) hours as needed for pain.      . Lansoprazole (PREVACID PO) Take 1 tablet by mouth daily. OTC      . metoprolol succinate (TOPROL-XL) 25 MG 24 hr tablet Take 1 tablet (25 mg total) by mouth 2 (two) times daily.  60 tablet  1  . potassium chloride SA (K-DUR,KLOR-CON) 20 MEQ tablet Take 1 tablet (20 mEq total) by mouth daily.  30 tablet  3  . promethazine (PHENERGAN) 25 MG tablet Take 25 mg by mouth as needed for nausea.      . Rivaroxaban (XARELTO) 20 MG TABS Take 1 tablet (20 mg total) by mouth daily with supper.  30 tablet  1  . traMADol (ULTRAM) 50 MG tablet Take 50 mg by mouth every 8 (eight) hours as needed for pain.      . traZODone (DESYREL) 100 MG tablet Take 100 mg by mouth at bedtime.      Marland Kitchen venlafaxine XR (EFFEXOR-XR) 150 MG 24 hr capsule Take 1 capsule (150 mg total) by mouth daily.  30 capsule  1  . ALPRAZolam (XANAX) 0.25 MG tablet Take 1 tablet (0.25 mg total) by mouth at bedtime as needed for sleep or anxiety.  30 tablet  0   No current facility-administered medications for this visit.  Physical Exam:   BP 114/69  Pulse 76  Resp 20  Ht 5\' 5"  (1.651 m)  Wt 216 lb (97.977 kg)  BMI 35.94 kg/m2  SpO2 95%  General:  Obese but well-appearing  Chest:   Clear to auscultation  CV:   Regular rate and rhythm with systolic murmur  Incisions:  n/a  Abdomen:  Soft and nontender  Extremities:  Warm and adequately perfused  Diagnostic Tests:  *RADIOLOGY REPORT*  Clinical Data: Preop mitral valve repair  CHEST - 2 VIEW  Comparison: 06/19/2012  Findings: Heart size is upper normal. Resolution of previously  identified heart failure with interstitial edema. Lungs are now  clear. Negative for infiltrate effusion or mass.  IMPRESSION:  Interval clearing of pulmonary edema.  No active cardiopulmonary abnormality is identified.  Original Report Authenticated By: Janeece Riggers, M.D.    Impression:  The patient has likely rheumatic mitral valve  disease with at least moderate mitral stenosis and moderate to severe mitral regurgitation. She describes a long history of symptoms consistent with chronic diastolic congestive heart failure with acute exacerbation causing her current hospitalization with class IV symptoms. She has improved rapidly with diuretic therapy. Left ventricular systolic function is well-preserved and the patient does not have significant coronary artery disease. Her valve appears to be rheumatic and will unquestionably need to be replaced. Risks associated with surgical intervention will be somewhat elevated do to the patient's obesity, long-standing history of tobacco abuse with the possibility of significant COPD, and history of CLL. She appears to be an acceptable candidate for minimally invasive approach for surgery.  She has been taking Xarelto.   Plan:  I have again reviewed the rationale for elective mitral valve replacement with the patient and her family here in the office today. Alternative surgical approaches been discussed including a comparison between conventional sternotomy and minimally invasive techniques. The relative risks and benefits of each have been reviewed. Because the patient has not yet stopped taking the relative we will postpone surgery 1 day to Thursday, April 3. She we'll otherwise continue all other medications as currently prescribed. The patient and her family understand and accept all potential associated risks of surgery including but not limited to risk of death, stroke, myocardial infarction, congestive heart failure, respiratory failure, renal failure, bleeding requiring blood transfusion, arrhythmia, heart block or bradycardia requiring permanent pacemaker, or late complications related to valve replacement. The patient specifically requests that her valve be replaced using a mechanical prosthesis and she understands that this will imply that she will need to remain anticoagulated on Coumadin  for the remainder of her life. All of her questions been addressed.   Salvatore Decent. Cornelius Moras, MD 07/04/2012 6:02 PM

## 2012-07-04 NOTE — Patient Instructions (Signed)
Stop taking Xarelto now.  Continue all other medications through the night before surgery.  On the morning of surgery take only your metoprolol (Toprol) and lansoprazole (Prevacid) with a sip of water.

## 2012-07-04 NOTE — Pre-Procedure Instructions (Signed)
Priscilla Stevens  07/04/2012   Your procedure is scheduled on:  Wednesday, April 2nd  Report to [facility] at 0630 AM.  Call this number if you have problems the morning of surgery: [phone]   Remember:   Do not eat food or drink liquids after midnight.    Take these medicines the morning of surgery with A SIP OF WATER: Amiodarone, Toprol, prevacid, vicodin if needed   Do not wear jewelry, make-up or nail polish.  Do not wear lotions, powders, or perfume, deodorant.  Do not shave 48 hours prior to surgery. .  Do not bring valuables to the hospital.  Contacts, dentures or bridgework may not be worn into surgery.  Leave suitcase in the car. After surgery it may be brought to your room.  For patients admitted to the hospital, checkout time is 11:00 AM the day of discharge.   Patients discharged the day of surgery will not be allowed to drive home.   Special Instructions: Shower using CHG 2 nights before surgery and the night before surgery.  If you shower the day of surgery use CHG.  Use special wash - you have one bottle of CHG for all showers.  You should use approximately 1/3 of the bottle for each shower.   Please read over the following fact sheets that you were given: Pain Booklet, Coughing and Deep Breathing, Blood Transfusion Information, MRSA Information and Surgical Site Infection Prevention

## 2012-07-04 NOTE — Progress Notes (Signed)
Completed heart surgery education with patient.  Book given.  Answered all questions and told to call office if any other concerns came up.

## 2012-07-04 NOTE — Progress Notes (Signed)
PATIENT WILL ASK DR. OWEN AT APPT TODAY  IF SHE NEEDS TO STOP XARELTO PRIOR TO SURGERY.

## 2012-07-06 ENCOUNTER — Encounter (HOSPITAL_COMMUNITY): Payer: Self-pay | Admitting: *Deleted

## 2012-07-06 MED ORDER — DEXMEDETOMIDINE HCL IN NACL 400 MCG/100ML IV SOLN
0.1000 ug/kg/h | INTRAVENOUS | Status: AC
  Administered 2012-07-07: 0.2 ug/kg/h via INTRAVENOUS
  Filled 2012-07-06: qty 100

## 2012-07-06 MED ORDER — DEXTROSE 5 % IV SOLN
1.5000 g | INTRAVENOUS | Status: AC
  Administered 2012-07-07: .7 g via INTRAVENOUS
  Administered 2012-07-07: 1.5 g via INTRAVENOUS
  Filled 2012-07-06 (×2): qty 1.5

## 2012-07-06 MED ORDER — PHENYLEPHRINE HCL 10 MG/ML IJ SOLN
30.0000 ug/min | INTRAVENOUS | Status: AC
  Administered 2012-07-07: 30 ug/min via INTRAVENOUS
  Filled 2012-07-06: qty 2

## 2012-07-06 MED ORDER — POTASSIUM CHLORIDE 2 MEQ/ML IV SOLN
80.0000 meq | INTRAVENOUS | Status: DC
  Filled 2012-07-06: qty 40

## 2012-07-06 MED ORDER — VANCOMYCIN HCL 1000 MG IV SOLR
INTRAVENOUS | Status: AC
  Administered 2012-07-07: 12:00:00
  Filled 2012-07-06: qty 1000

## 2012-07-06 MED ORDER — EPINEPHRINE HCL 1 MG/ML IJ SOLN
0.5000 ug/min | INTRAVENOUS | Status: DC
  Filled 2012-07-06: qty 4

## 2012-07-06 MED ORDER — SODIUM CHLORIDE 0.9 % IV SOLN
INTRAVENOUS | Status: AC
  Administered 2012-07-07: 70 mL/h via INTRAVENOUS
  Filled 2012-07-06: qty 40

## 2012-07-06 MED ORDER — NITROGLYCERIN IN D5W 200-5 MCG/ML-% IV SOLN
2.0000 ug/min | INTRAVENOUS | Status: AC
  Administered 2012-07-07: 5 ug/min via INTRAVENOUS
  Filled 2012-07-06: qty 250

## 2012-07-06 MED ORDER — MAGNESIUM SULFATE 50 % IJ SOLN
40.0000 meq | INTRAMUSCULAR | Status: DC
  Filled 2012-07-06: qty 10

## 2012-07-06 MED ORDER — DOPAMINE-DEXTROSE 3.2-5 MG/ML-% IV SOLN
2.0000 ug/kg/min | INTRAVENOUS | Status: DC
  Filled 2012-07-06: qty 250

## 2012-07-06 MED ORDER — VANCOMYCIN HCL 10 G IV SOLR
1250.0000 mg | INTRAVENOUS | Status: AC
  Administered 2012-07-07: 1250 mg via INTRAVENOUS
  Filled 2012-07-06 (×2): qty 1250

## 2012-07-06 MED ORDER — PLASMA-LYTE 148 IV SOLN
INTRAVENOUS | Status: DC
  Filled 2012-07-06: qty 2.5

## 2012-07-06 MED ORDER — DEXTROSE 5 % IV SOLN
750.0000 mg | INTRAVENOUS | Status: DC
  Filled 2012-07-06: qty 750

## 2012-07-06 MED ORDER — SODIUM CHLORIDE 0.9 % IV SOLN
INTRAVENOUS | Status: AC
  Administered 2012-07-07: 2.1 [IU]/h via INTRAVENOUS
  Filled 2012-07-06: qty 1

## 2012-07-07 ENCOUNTER — Encounter (HOSPITAL_COMMUNITY): Payer: Self-pay | Admitting: Anesthesiology

## 2012-07-07 ENCOUNTER — Inpatient Hospital Stay (HOSPITAL_COMMUNITY): Payer: Medicare Other | Admitting: Anesthesiology

## 2012-07-07 ENCOUNTER — Inpatient Hospital Stay (HOSPITAL_COMMUNITY)
Admission: RE | Admit: 2012-07-07 | Discharge: 2012-07-13 | DRG: 219 | Disposition: A | Discharge status: Home / Self Care | Payer: Medicare Other | Source: Ambulatory Visit | Attending: Thoracic Surgery (Cardiothoracic Vascular Surgery) | Admitting: Thoracic Surgery (Cardiothoracic Vascular Surgery)

## 2012-07-07 ENCOUNTER — Encounter (HOSPITAL_COMMUNITY)
Admission: RE | Disposition: A | Discharge status: Home / Self Care | Payer: Self-pay | Source: Ambulatory Visit | Attending: Thoracic Surgery (Cardiothoracic Vascular Surgery)

## 2012-07-07 ENCOUNTER — Encounter (HOSPITAL_COMMUNITY): Payer: Self-pay | Admitting: Thoracic Surgery (Cardiothoracic Vascular Surgery)

## 2012-07-07 ENCOUNTER — Inpatient Hospital Stay (HOSPITAL_COMMUNITY): Payer: Medicare Other

## 2012-07-07 DIAGNOSIS — I059 Rheumatic mitral valve disease, unspecified: Secondary | ICD-10-CM

## 2012-07-07 DIAGNOSIS — I5033 Acute on chronic diastolic (congestive) heart failure: Secondary | ICD-10-CM | POA: Diagnosis present

## 2012-07-07 DIAGNOSIS — I050 Rheumatic mitral stenosis: Principal | ICD-10-CM | POA: Diagnosis present

## 2012-07-07 DIAGNOSIS — I509 Heart failure, unspecified: Secondary | ICD-10-CM | POA: Diagnosis present

## 2012-07-07 DIAGNOSIS — I340 Nonrheumatic mitral (valve) insufficiency: Secondary | ICD-10-CM | POA: Diagnosis present

## 2012-07-07 DIAGNOSIS — I5032 Chronic diastolic (congestive) heart failure: Secondary | ICD-10-CM

## 2012-07-07 DIAGNOSIS — Z954 Presence of other heart-valve replacement: Secondary | ICD-10-CM

## 2012-07-07 DIAGNOSIS — J811 Chronic pulmonary edema: Secondary | ICD-10-CM | POA: Diagnosis present

## 2012-07-07 HISTORY — PX: INTRAOPERATIVE TRANSESOPHAGEAL ECHOCARDIOGRAM: SHX5062

## 2012-07-07 HISTORY — DX: Chronic diastolic (congestive) heart failure: I50.32

## 2012-07-07 HISTORY — DX: Presence of other heart-valve replacement: Z95.4

## 2012-07-07 HISTORY — PX: MITRAL VALVE REPLACEMENT: SHX147

## 2012-07-07 LAB — POCT I-STAT 3, ART BLOOD GAS (G3+)
Bicarbonate: 24.6 mEq/L — ABNORMAL HIGH (ref 20.0–24.0)
O2 Saturation: 100 %
O2 Saturation: 85 %
O2 Saturation: 98 %
O2 Saturation: 96 %
Patient temperature: 35.1
TCO2: 28 mmol/L (ref 0–100)
TCO2: 27 mmol/L (ref 0–100)
TCO2: 28 mmol/L (ref 0–100)
TCO2: 25 mmol/L (ref 0–100)
pCO2 arterial: 52 mmHg — ABNORMAL HIGH (ref 35.0–45.0)
pCO2 arterial: 65.5 mmHg (ref 35.0–45.0)
pCO2 arterial: 50.6 mmHg — ABNORMAL HIGH (ref 35.0–45.0)
pCO2 arterial: 44.3 mmHg (ref 35.0–45.0)
pH, Arterial: 7.171 — CL (ref 7.350–7.450)
pH, Arterial: 7.299 — ABNORMAL LOW (ref 7.350–7.450)
pH, Arterial: 7.346 — ABNORMAL LOW (ref 7.350–7.450)
pO2, Arterial: 58 mmHg — ABNORMAL LOW (ref 80.0–100.0)
pO2, Arterial: 107 mmHg — ABNORMAL HIGH (ref 80.0–100.0)
pO2, Arterial: 89 mmHg (ref 80.0–100.0)

## 2012-07-07 LAB — POCT I-STAT GLUCOSE
Glucose, Bld: 144 mg/dL — ABNORMAL HIGH (ref 70–99)
Operator id: 132841

## 2012-07-07 LAB — CREATININE, SERUM
Creatinine, Ser: 0.79 mg/dL (ref 0.50–1.10)
GFR calc non Af Amer: 89 mL/min — ABNORMAL LOW (ref >90)

## 2012-07-07 LAB — POCT I-STAT 4, (NA,K, GLUC, HGB,HCT)
Glucose, Bld: 156 mg/dL — ABNORMAL HIGH (ref 70–99)
Glucose, Bld: 163 mg/dL — ABNORMAL HIGH (ref 70–99)
Glucose, Bld: 143 mg/dL — ABNORMAL HIGH (ref 70–99)
HCT: 25 % — ABNORMAL LOW (ref 36.0–46.0)
HCT: 28 % — ABNORMAL LOW (ref 36.0–46.0)
Hemoglobin: 9.5 g/dL — ABNORMAL LOW (ref 12.0–15.0)
Potassium: 4.2 mEq/L (ref 3.5–5.1)
Potassium: 4.6 mEq/L (ref 3.5–5.1)
Potassium: 4.3 mEq/L (ref 3.5–5.1)
Potassium: 3.8 mEq/L (ref 3.5–5.1)
Sodium: 141 mEq/L (ref 135–145)
Sodium: 138 mEq/L (ref 135–145)

## 2012-07-07 LAB — CBC
HCT: 26.5 % — ABNORMAL LOW (ref 36.0–46.0)
HCT: 21.5 % — ABNORMAL LOW (ref 36.0–46.0)
Hemoglobin: 8.7 g/dL — ABNORMAL LOW (ref 12.0–15.0)
Hemoglobin: 7.3 g/dL — ABNORMAL LOW (ref 12.0–15.0)
MCHC: 34 g/dL (ref 30.0–36.0)
MCV: 84.7 fL (ref 78.0–100.0)
RDW: 15.4 % (ref 11.5–15.5)
WBC: 23.4 10*3/uL — ABNORMAL HIGH (ref 4.0–10.5)
WBC: 12.6 10*3/uL — ABNORMAL HIGH (ref 4.0–10.5)

## 2012-07-07 LAB — POCT I-STAT, CHEM 8
Chloride: 108 mEq/L (ref 96–112)
Creatinine, Ser: 0.8 mg/dL (ref 0.50–1.10)
Glucose, Bld: 129 mg/dL — ABNORMAL HIGH (ref 70–99)
HCT: 19 % — ABNORMAL LOW (ref 36.0–46.0)
Hemoglobin: 6.5 g/dL — CL (ref 12.0–15.0)
Potassium: 3.6 mEq/L (ref 3.5–5.1)
Sodium: 142 mEq/L (ref 135–145)

## 2012-07-07 LAB — GLUCOSE, CAPILLARY
Glucose-Capillary: 115 mg/dL — ABNORMAL HIGH (ref 70–99)
Glucose-Capillary: 124 mg/dL — ABNORMAL HIGH (ref 70–99)

## 2012-07-07 LAB — PROTIME-INR: INR: 1.49 (ref 0.00–1.49)

## 2012-07-07 LAB — HEMOGLOBIN AND HEMATOCRIT, BLOOD
HCT: 24.9 % — ABNORMAL LOW (ref 36.0–46.0)
Hemoglobin: 8.4 g/dL — ABNORMAL LOW (ref 12.0–15.0)

## 2012-07-07 LAB — APTT: aPTT: 36 seconds (ref 24–37)

## 2012-07-07 LAB — PREPARE RBC (CROSSMATCH)

## 2012-07-07 LAB — MAGNESIUM: Magnesium: 2.5 mg/dL (ref 1.5–2.5)

## 2012-07-07 SURGERY — ECHOCARDIOGRAM, TRANSESOPHAGEAL, INTRAOPERATIVE
Anesthesia: General | Site: Chest | Laterality: Right

## 2012-07-07 MED ORDER — DEXMEDETOMIDINE HCL IN NACL 200 MCG/50ML IV SOLN
0.1000 ug/kg/h | INTRAVENOUS | Status: DC
  Administered 2012-07-07: 0.5 ug/kg/h via INTRAVENOUS
  Filled 2012-07-07: qty 50

## 2012-07-07 MED ORDER — MILRINONE IN DEXTROSE 20 MG/100ML IV SOLN
0.3000 ug/kg/min | INTRAVENOUS | Status: DC
  Administered 2012-07-07: 0.3 ug/kg/min via INTRAVENOUS
  Filled 2012-07-07: qty 100

## 2012-07-07 MED ORDER — VECURONIUM BROMIDE 10 MG IV SOLR
INTRAVENOUS | Status: DC | PRN
  Administered 2012-07-07 (×2): 5 mg via INTRAVENOUS

## 2012-07-07 MED ORDER — ALBUMIN HUMAN 5 % IV SOLN
250.0000 mL | INTRAVENOUS | Status: AC | PRN
  Administered 2012-07-07 (×4): 250 mL via INTRAVENOUS
  Filled 2012-07-07: qty 500

## 2012-07-07 MED ORDER — PROTAMINE SULFATE 10 MG/ML IV SOLN
INTRAVENOUS | Status: DC | PRN
  Administered 2012-07-07: 30 mg via INTRAVENOUS
  Administered 2012-07-07: 20 mg via INTRAVENOUS
  Administered 2012-07-07 (×2): 30 mg via INTRAVENOUS
  Administered 2012-07-07 (×3): 50 mg via INTRAVENOUS
  Administered 2012-07-07: 20 mg via INTRAVENOUS

## 2012-07-07 MED ORDER — ALBUMIN HUMAN 5 % IV SOLN
250.0000 mL | INTRAVENOUS | Status: AC | PRN
  Administered 2012-07-07 (×2): 250 mL via INTRAVENOUS
  Filled 2012-07-07 (×2): qty 250

## 2012-07-07 MED ORDER — ETOMIDATE 2 MG/ML IV SOLN
INTRAVENOUS | Status: DC | PRN
  Administered 2012-07-07: 10 mg via INTRAVENOUS

## 2012-07-07 MED ORDER — LACTATED RINGERS IV SOLN
INTRAVENOUS | Status: DC | PRN
  Administered 2012-07-07: 07:00:00 via INTRAVENOUS

## 2012-07-07 MED ORDER — FENTANYL CITRATE 0.05 MG/ML IJ SOLN
INTRAMUSCULAR | Status: AC
  Filled 2012-07-07: qty 2

## 2012-07-07 MED ORDER — SODIUM CHLORIDE 0.9 % IV SOLN: 250.0000 mL | INTRAVENOUS | Status: DC

## 2012-07-07 MED ORDER — METOPROLOL TARTRATE 1 MG/ML IV SOLN: 2.5000 mg | INTRAVENOUS | Status: DC | PRN

## 2012-07-07 MED ORDER — SODIUM CHLORIDE 0.9 % IJ SOLN
OROMUCOSAL | Status: DC | PRN
  Administered 2012-07-07 (×7): via TOPICAL

## 2012-07-07 MED ORDER — FENTANYL CITRATE 0.05 MG/ML IJ SOLN
INTRAMUSCULAR | Status: DC | PRN
  Administered 2012-07-07: 100 ug via INTRAVENOUS

## 2012-07-07 MED ORDER — DOCUSATE SODIUM 100 MG PO CAPS
200.0000 mg | ORAL_CAPSULE | Freq: Every day | ORAL | Status: DC
  Administered 2012-07-08 – 2012-07-12 (×5): 200 mg via ORAL
  Filled 2012-07-07 (×5): qty 2

## 2012-07-07 MED ORDER — OXYCODONE HCL 5 MG PO TABS
5.0000 mg | ORAL_TABLET | ORAL | Status: DC | PRN
  Administered 2012-07-08 – 2012-07-09 (×6): 10 mg via ORAL
  Administered 2012-07-09 – 2012-07-10 (×2): 5 mg via ORAL
  Filled 2012-07-07 (×3): qty 2
  Filled 2012-07-07: qty 1
  Filled 2012-07-07: qty 2
  Filled 2012-07-07: qty 1
  Filled 2012-07-07 (×2): qty 2

## 2012-07-07 MED ORDER — MAGNESIUM SULFATE 40 MG/ML IJ SOLN
4.0000 g | Freq: Once | INTRAMUSCULAR | Status: AC
  Administered 2012-07-07: 4 g via INTRAVENOUS
  Filled 2012-07-07: qty 100

## 2012-07-07 MED ORDER — HEPARIN SODIUM (PORCINE) 1000 UNIT/ML IJ SOLN
INTRAMUSCULAR | Status: DC | PRN
  Administered 2012-07-07: 30000 [IU] via INTRAVENOUS

## 2012-07-07 MED ORDER — FENTANYL CITRATE 0.05 MG/ML IJ SOLN
INTRAMUSCULAR | Status: DC | PRN
  Administered 2012-07-07: 150 ug via INTRAVENOUS
  Administered 2012-07-07: 250 ug via INTRAVENOUS
  Administered 2012-07-07: 500 ug via INTRAVENOUS
  Administered 2012-07-07: 250 ug via INTRAVENOUS
  Administered 2012-07-07: 100 ug via INTRAVENOUS

## 2012-07-07 MED ORDER — MIDAZOLAM HCL 5 MG/5ML IJ SOLN
INTRAMUSCULAR | Status: DC | PRN
  Administered 2012-07-07 (×2): 2 mg via INTRAVENOUS
  Administered 2012-07-07: 5 mg via INTRAVENOUS
  Administered 2012-07-07: 1 mg via INTRAVENOUS
  Administered 2012-07-07: 2 mg via INTRAVENOUS

## 2012-07-07 MED ORDER — FAMOTIDINE IN NACL 20-0.9 MG/50ML-% IV SOLN
20.0000 mg | Freq: Two times a day (BID) | INTRAVENOUS | Status: AC
  Administered 2012-07-07 – 2012-07-08 (×2): 20 mg via INTRAVENOUS
  Filled 2012-07-07 (×2): qty 50

## 2012-07-07 MED ORDER — ACETAMINOPHEN 500 MG PO TABS
1000.0000 mg | ORAL_TABLET | Freq: Four times a day (QID) | ORAL | Status: AC
  Administered 2012-07-08 – 2012-07-12 (×19): 1000 mg via ORAL
  Filled 2012-07-07 (×19): qty 2

## 2012-07-07 MED ORDER — PANTOPRAZOLE SODIUM 40 MG PO TBEC
40.0000 mg | DELAYED_RELEASE_TABLET | Freq: Every day | ORAL | Status: DC
  Administered 2012-07-09 – 2012-07-12 (×4): 40 mg via ORAL
  Filled 2012-07-07 (×4): qty 1

## 2012-07-07 MED ORDER — POTASSIUM CHLORIDE 10 MEQ/50ML IV SOLN
10.0000 meq | INTRAVENOUS | Status: AC | PRN
  Administered 2012-07-08 – 2012-07-10 (×4): 10 meq via INTRAVENOUS
  Filled 2012-07-07: qty 150
  Filled 2012-07-07: qty 100

## 2012-07-07 MED ORDER — SODIUM CHLORIDE 0.9 % IV SOLN
INTRAVENOUS | Status: DC
  Administered 2012-07-07: 5.1 [IU]/h via INTRAVENOUS
  Filled 2012-07-07: qty 1

## 2012-07-07 MED ORDER — ACETAMINOPHEN 10 MG/ML IV SOLN
1000.0000 mg | Freq: Once | INTRAVENOUS | Status: AC
  Administered 2012-07-07: 1000 mg via INTRAVENOUS
  Filled 2012-07-07: qty 100

## 2012-07-07 MED ORDER — METOPROLOL TARTRATE 25 MG/10 ML ORAL SUSPENSION
12.5000 mg | Freq: Two times a day (BID) | ORAL | Status: DC
  Filled 2012-07-07 (×3): qty 5

## 2012-07-07 MED ORDER — DEXTROSE 5 % IV SOLN
1.5000 g | Freq: Two times a day (BID) | INTRAVENOUS | Status: AC
  Administered 2012-07-07 – 2012-07-09 (×4): 1.5 g via INTRAVENOUS
  Filled 2012-07-07 (×4): qty 1.5

## 2012-07-07 MED ORDER — 0.9 % SODIUM CHLORIDE (POUR BTL) OPTIME
TOPICAL | Status: DC | PRN
  Administered 2012-07-07: 6000 mL

## 2012-07-07 MED ORDER — METOPROLOL TARTRATE 12.5 MG HALF TABLET: 12.5000 mg | ORAL_TABLET | Freq: Once | ORAL | Status: DC

## 2012-07-07 MED ORDER — MORPHINE SULFATE 2 MG/ML IJ SOLN
2.0000 mg | INTRAMUSCULAR | Status: DC | PRN
  Administered 2012-07-08 (×2): 4 mg via INTRAVENOUS
  Administered 2012-07-08 (×2): 2 mg via INTRAVENOUS
  Administered 2012-07-08: 4 mg via INTRAVENOUS
  Filled 2012-07-07: qty 1
  Filled 2012-07-07: qty 2
  Filled 2012-07-07: qty 1
  Filled 2012-07-07: qty 2

## 2012-07-07 MED ORDER — DOPAMINE-DEXTROSE 3.2-5 MG/ML-% IV SOLN
0.0000 ug/kg/min | INTRAVENOUS | Status: DC
Start: 2012-07-07 — End: 2012-07-10

## 2012-07-07 MED ORDER — ONDANSETRON HCL 4 MG/2ML IJ SOLN
4.0000 mg | Freq: Four times a day (QID) | INTRAMUSCULAR | Status: DC | PRN
  Administered 2012-07-08: 4 mg via INTRAVENOUS
  Filled 2012-07-07: qty 2

## 2012-07-07 MED ORDER — SODIUM CHLORIDE 0.9 % IV SOLN: INTRAVENOUS | Status: DC

## 2012-07-07 MED ORDER — SODIUM BICARBONATE 8.4 % IV SOLN
50.0000 meq | Freq: Once | INTRAVENOUS | Status: AC
  Administered 2012-07-07: 50 meq via INTRAVENOUS
  Filled 2012-07-07: qty 50

## 2012-07-07 MED ORDER — SODIUM CHLORIDE 0.45 % IV SOLN
INTRAVENOUS | Status: DC
  Administered 2012-07-07: 20 mL/h via INTRAVENOUS

## 2012-07-07 MED ORDER — ALBUTEROL SULFATE HFA 108 (90 BASE) MCG/ACT IN AERS
INHALATION_SPRAY | RESPIRATORY_TRACT | Status: DC | PRN
  Administered 2012-07-07 (×2): 2 via RESPIRATORY_TRACT

## 2012-07-07 MED ORDER — METOPROLOL TARTRATE 12.5 MG HALF TABLET
12.5000 mg | ORAL_TABLET | Freq: Two times a day (BID) | ORAL | Status: DC
  Administered 2012-07-08 – 2012-07-10 (×4): 12.5 mg via ORAL
  Filled 2012-07-07 (×9): qty 1

## 2012-07-07 MED ORDER — BISACODYL 5 MG PO TBEC
10.0000 mg | DELAYED_RELEASE_TABLET | Freq: Every day | ORAL | Status: DC
  Administered 2012-07-08 – 2012-07-12 (×5): 10 mg via ORAL
  Filled 2012-07-07 (×5): qty 2

## 2012-07-07 MED ORDER — CALCIUM CHLORIDE 10 % IV SOLN
1.0000 g | Freq: Once | INTRAVENOUS | Status: AC | PRN
  Administered 2012-07-07: 1 g via INTRAVENOUS
  Filled 2012-07-07: qty 10

## 2012-07-07 MED ORDER — INSULIN REGULAR BOLUS VIA INFUSION
0.0000 [IU] | Freq: Three times a day (TID) | INTRAVENOUS | Status: DC
  Filled 2012-07-07: qty 10

## 2012-07-07 MED ORDER — ROCURONIUM BROMIDE 100 MG/10ML IV SOLN
INTRAVENOUS | Status: DC | PRN
  Administered 2012-07-07: 70 mg via INTRAVENOUS
  Administered 2012-07-07: 30 mg via INTRAVENOUS

## 2012-07-07 MED ORDER — MIDAZOLAM HCL 2 MG/2ML IJ SOLN
INTRAMUSCULAR | Status: AC
  Filled 2012-07-07: qty 2

## 2012-07-07 MED ORDER — MILRINONE IN DEXTROSE 20 MG/100ML IV SOLN
0.1250 ug/kg/min | INTRAVENOUS | Status: DC
  Filled 2012-07-07: qty 100

## 2012-07-07 MED ORDER — ALBUMIN HUMAN 5 % IV SOLN
INTRAVENOUS | Status: DC | PRN
  Administered 2012-07-07 (×2): via INTRAVENOUS

## 2012-07-07 MED ORDER — PHENYLEPHRINE HCL 10 MG/ML IJ SOLN
30.0000 ug/min | INTRAVENOUS | Status: DC
  Filled 2012-07-07: qty 2

## 2012-07-07 MED ORDER — VANCOMYCIN HCL IN DEXTROSE 1-5 GM/200ML-% IV SOLN
1000.0000 mg | Freq: Once | INTRAVENOUS | Status: AC
  Administered 2012-07-08: 1000 mg via INTRAVENOUS
  Filled 2012-07-07: qty 200

## 2012-07-07 MED ORDER — SODIUM CHLORIDE 0.9 % IJ SOLN
3.0000 mL | Freq: Two times a day (BID) | INTRAMUSCULAR | Status: DC
  Administered 2012-07-08 – 2012-07-10 (×5): 3 mL via INTRAVENOUS

## 2012-07-07 MED ORDER — MORPHINE SULFATE 2 MG/ML IJ SOLN
1.0000 mg | INTRAMUSCULAR | Status: AC | PRN
  Filled 2012-07-07: qty 2

## 2012-07-07 MED ORDER — PHENYLEPHRINE HCL 10 MG/ML IJ SOLN
0.0000 ug/min | INTRAVENOUS | Status: DC
  Filled 2012-07-07 (×2): qty 2

## 2012-07-07 MED ORDER — MIDAZOLAM HCL 2 MG/2ML IJ SOLN
2.0000 mg | INTRAMUSCULAR | Status: DC | PRN
  Administered 2012-07-07: 2 mg via INTRAVENOUS
  Filled 2012-07-07: qty 2

## 2012-07-07 MED ORDER — MILRINONE IN DEXTROSE 20 MG/100ML IV SOLN
INTRAVENOUS | Status: DC | PRN
  Administered 2012-07-07: .3 ug/kg/min via INTRAVENOUS

## 2012-07-07 MED ORDER — LACTATED RINGERS IV SOLN
INTRAVENOUS | Status: DC
  Administered 2012-07-08: 18:00:00 via INTRAVENOUS

## 2012-07-07 MED ORDER — ASPIRIN EC 325 MG PO TBEC
325.0000 mg | DELAYED_RELEASE_TABLET | Freq: Every day | ORAL | Status: DC
  Administered 2012-07-10: 325 mg via ORAL
  Filled 2012-07-07 (×4): qty 1

## 2012-07-07 MED ORDER — SODIUM CHLORIDE 0.9 % IJ SOLN: 3.0000 mL | INTRAMUSCULAR | Status: DC | PRN

## 2012-07-07 MED ORDER — ACETAMINOPHEN 160 MG/5ML PO SOLN: 975.0000 mg | Freq: Four times a day (QID) | ORAL | Status: DC

## 2012-07-07 MED ORDER — ASPIRIN 81 MG PO CHEW: 324.0000 mg | CHEWABLE_TABLET | Freq: Every day | ORAL | Status: DC

## 2012-07-07 MED ORDER — NITROGLYCERIN IN D5W 200-5 MCG/ML-% IV SOLN: 0.0000 ug/min | INTRAVENOUS | Status: DC

## 2012-07-07 MED ORDER — POTASSIUM CHLORIDE 10 MEQ/50ML IV SOLN
10.0000 meq | INTRAVENOUS | Status: AC
  Administered 2012-07-07 (×3): 10 meq via INTRAVENOUS

## 2012-07-07 MED ORDER — CHLORHEXIDINE GLUCONATE 4 % EX LIQD: 30.0000 mL | CUTANEOUS | Status: DC

## 2012-07-07 MED ORDER — BISACODYL 10 MG RE SUPP: 10.0000 mg | Freq: Every day | RECTAL | Status: DC

## 2012-07-07 MED ORDER — SODIUM CHLORIDE 0.9 % IR SOLN
Status: DC | PRN
  Administered 2012-07-07: 3000 mL

## 2012-07-07 SURGICAL SUPPLY — 126 items
ADAPTER CARDIO PERF ANTE/RETRO (ADAPTER) ×4 IMPLANT
ANTEGRADE CPLG (MISCELLANEOUS) ×4 IMPLANT
ATTRACTOMAT 16X20 MAGNETIC DRP (DRAPES) ×4 IMPLANT
BAG DECANTER FOR FLEXI CONT (MISCELLANEOUS) ×4 IMPLANT
BENZOIN TINCTURE PRP APPL 2/3 (GAUZE/BANDAGES/DRESSINGS) ×12 IMPLANT
BLADE STERNUM SYSTEM 6 (BLADE) ×4 IMPLANT
BLADE SURG 11 STRL SS (BLADE) ×4 IMPLANT
BOOT SUTURE AID YELLOW STND (SUTURE) ×4 IMPLANT
CANISTER SUCTION 2500CC (MISCELLANEOUS) ×8 IMPLANT
CANNULA FEM VENOUS REMOTE 22FR (CANNULA) ×8 IMPLANT
CANNULA FEMORAL ART 14 SM (MISCELLANEOUS) ×4 IMPLANT
CANNULA GUNDRY RCSP 15FR (MISCELLANEOUS) ×4 IMPLANT
CANNULA OPTISITE PERFUSION 16F (CANNULA) IMPLANT
CANNULA OPTISITE PERFUSION 18F (CANNULA) IMPLANT
CANNULA SOFTFLOW AORTIC 7M21FR (CANNULA) ×4 IMPLANT
CATH THORACIC 36FR (CATHETERS) ×4 IMPLANT
CLEANER TIP ELECTROSURG 2X2 (MISCELLANEOUS) ×4 IMPLANT
CLOTH BEACON ORANGE TIMEOUT ST (SAFETY) ×4 IMPLANT
CONN ST 1/4X3/8  BEN (MISCELLANEOUS) ×3
CONN ST 1/4X3/8 BEN (MISCELLANEOUS) ×9 IMPLANT
CONNECTOR 1/2X3/8X1/2 3 WAY (MISCELLANEOUS) ×1
CONNECTOR 1/2X3/8X1/2 3WAY (MISCELLANEOUS) ×3 IMPLANT
CONT SPEC 4OZ CLIKSEAL STRL BL (MISCELLANEOUS) ×8 IMPLANT
CONT SPEC STER OR (MISCELLANEOUS) ×4 IMPLANT
COVER MAYO STAND STRL (DRAPES) ×4 IMPLANT
COVER PROBE W GEL 5X96 (DRAPES) ×4 IMPLANT
COVER SURGICAL LIGHT HANDLE (MISCELLANEOUS) ×4 IMPLANT
CRADLE DONUT ADULT HEAD (MISCELLANEOUS) ×4 IMPLANT
DERMABOND ADVANCED (GAUZE/BANDAGES/DRESSINGS) ×1
DERMABOND ADVANCED .7 DNX12 (GAUZE/BANDAGES/DRESSINGS) ×3 IMPLANT
DEVICE TROCAR PUNCTURE CLOSURE (ENDOMECHANICALS) ×4 IMPLANT
DRAIN CHANNEL 28F RND 3/8 FF (WOUND CARE) ×8 IMPLANT
DRAIN CHANNEL 32F RND 10.7 FF (WOUND CARE) ×4 IMPLANT
DRAPE BILATERAL SPLIT (DRAPES) ×4 IMPLANT
DRAPE C-ARM 42X72 X-RAY (DRAPES) ×4 IMPLANT
DRAPE CV SPLIT W-CLR ANES SCRN (DRAPES) ×4 IMPLANT
DRAPE INCISE IOBAN 66X45 STRL (DRAPES) ×8 IMPLANT
DRAPE SLUSH/WARMER DISC (DRAPES) ×4 IMPLANT
DRSG COVADERM 4X8 (GAUZE/BANDAGES/DRESSINGS) ×4 IMPLANT
ELECT BLADE 6.5 EXT (BLADE) ×4 IMPLANT
ELECT REM PT RETURN 9FT ADLT (ELECTROSURGICAL) ×8
ELECTRODE REM PT RTRN 9FT ADLT (ELECTROSURGICAL) ×6 IMPLANT
FEMORAL VENOUS CANN RAP (CANNULA) IMPLANT
GLOVE BIO SURGEON STRL SZ 6 (GLOVE) ×16 IMPLANT
GLOVE BIO SURGEON STRL SZ 6.5 (GLOVE) ×4 IMPLANT
GLOVE BIO SURGEON STRL SZ7 (GLOVE) ×8 IMPLANT
GLOVE BIOGEL PI IND STRL 6.5 (GLOVE) ×3 IMPLANT
GLOVE BIOGEL PI INDICATOR 6.5 (GLOVE) ×1
GLOVE ORTHO TXT STRL SZ7.5 (GLOVE) ×16 IMPLANT
GOWN STRL NON-REIN LRG LVL3 (GOWN DISPOSABLE) ×36 IMPLANT
GUIDEWIRE ANG ZIPWIRE 038X150 (WIRE) ×4 IMPLANT
HEMOSTAT POWDER SURGIFOAM 1G (HEMOSTASIS) ×28 IMPLANT
INSERT CONFORM CROSS CLAMP 66M (MISCELLANEOUS) IMPLANT
INSERT CONFORM CROSS CLAMP 86M (MISCELLANEOUS) IMPLANT
INSERT FOGARTY XLG (MISCELLANEOUS) ×4 IMPLANT
KIT BASIN OR (CUSTOM PROCEDURE TRAY) ×4 IMPLANT
KIT DILATOR VASC 18G NDL (KITS) ×4 IMPLANT
KIT DRAINAGE VACCUM ASSIST (KITS) ×4 IMPLANT
KIT ROOM TURNOVER OR (KITS) ×4 IMPLANT
KIT SUCTION CATH 14FR (SUCTIONS) ×4 IMPLANT
LEAD PACING MYOCARDI (MISCELLANEOUS) ×4 IMPLANT
LINE VENT (MISCELLANEOUS) ×8 IMPLANT
NEEDLE AORTIC ROOT 14G 7F (CATHETERS) ×4 IMPLANT
NS IRRIG 1000ML POUR BTL (IV SOLUTION) ×24 IMPLANT
PACK OPEN HEART (CUSTOM PROCEDURE TRAY) ×4 IMPLANT
PAD ARMBOARD 7.5X6 YLW CONV (MISCELLANEOUS) ×8 IMPLANT
PAD ELECT DEFIB RADIOL ZOLL (MISCELLANEOUS) ×4 IMPLANT
RETRACTOR PVL SOFT TISSUE LG (INSTRUMENTS) ×4 IMPLANT
RETRACTOR TRL SOFT TISSUE LG (INSTRUMENTS) ×12 IMPLANT
RETRACTOR TRM SOFT TISSUE 7.5 (INSTRUMENTS) IMPLANT
SENSOR MYOCARDIAL TEMP (MISCELLANEOUS) ×4 IMPLANT
SET CANNULATION TOURNIQUET (MISCELLANEOUS) ×4 IMPLANT
SET CARDIOPLEGIA MPS 5001102 (MISCELLANEOUS) ×4 IMPLANT
SET IRRIG TUBING LAPAROSCOPIC (IRRIGATION / IRRIGATOR) ×4 IMPLANT
SOLUTION ANTI FOG 6CC (MISCELLANEOUS) ×4 IMPLANT
SPONGE GAUZE 4X4 12PLY (GAUZE/BANDAGES/DRESSINGS) ×4 IMPLANT
SPONGE LAP 18X18 X RAY DECT (DISPOSABLE) ×16 IMPLANT
SPONGE LAP 4X18 X RAY DECT (DISPOSABLE) ×4 IMPLANT
SUCKER WEIGHTED FLEX (MISCELLANEOUS) ×8 IMPLANT
SUT BONE WAX W31G (SUTURE) ×4 IMPLANT
SUT E-PACK MINIMALLY INVASIVE (SUTURE) ×4 IMPLANT
SUT ETHIBOND (SUTURE) IMPLANT
SUT ETHIBOND 2 0 SH (SUTURE) ×8 IMPLANT
SUT ETHIBOND 2 0 V4 (SUTURE) IMPLANT
SUT ETHIBOND 2 0V4 GREEN (SUTURE) IMPLANT
SUT ETHIBOND 2-0 RB-1 WHT (SUTURE) IMPLANT
SUT ETHIBOND 4 0 TF (SUTURE) IMPLANT
SUT ETHIBOND 5 0 C 1 30 (SUTURE) IMPLANT
SUT ETHIBOND NAB MH 2-0 36IN (SUTURE) IMPLANT
SUT ETHIBOND X763 2 0 SH 1 (SUTURE) ×12 IMPLANT
SUT GORETEX 6.0 TH-9 30 IN (SUTURE) IMPLANT
SUT GORETEX CV 4 TH 22 36 (SUTURE) ×4 IMPLANT
SUT GORETEX CV-5THC-13 36IN (SUTURE) IMPLANT
SUT GORETEX CV4 TH-18 (SUTURE) ×12 IMPLANT
SUT GORETEX TH-18 36 INCH (SUTURE) IMPLANT
SUT MNCRL AB 3-0 PS2 18 (SUTURE) ×8 IMPLANT
SUT MNCRL AB 4-0 PS2 18 (SUTURE) ×4 IMPLANT
SUT PDS AB 1 CTX 36 (SUTURE) ×8 IMPLANT
SUT PROLENE 4 0 RB 1 (SUTURE) ×4
SUT PROLENE 4 0 SH DA (SUTURE) ×4 IMPLANT
SUT PROLENE 4-0 RB1 .5 CRCL 36 (SUTURE) ×12 IMPLANT
SUT SILK  1 MH (SUTURE) ×2
SUT SILK 1 MH (SUTURE) ×6 IMPLANT
SUT STEEL STERNAL CCS#1 18IN (SUTURE) ×4 IMPLANT
SUT STEEL SZ 6 DBL 3X14 BALL (SUTURE) ×8 IMPLANT
SUT TEM PAC WIRE 2 0 SH (SUTURE) ×8 IMPLANT
SUT VIC AB 1 CTX 18 (SUTURE) ×8 IMPLANT
SUT VIC AB 2-0 CTX 27 (SUTURE) ×8 IMPLANT
SUT VIC AB 3-0 SH 8-18 (SUTURE) ×8 IMPLANT
SYRINGE 10CC LL (SYRINGE) ×4 IMPLANT
SYSTEM SAHARA CHEST DRAIN ATS (WOUND CARE) ×8 IMPLANT
TAPE CLOTH SURG 4X10 WHT LF (GAUZE/BANDAGES/DRESSINGS) ×4 IMPLANT
TAPE PAPER 2X10 WHT MICROPORE (GAUZE/BANDAGES/DRESSINGS) ×4 IMPLANT
TOWEL OR 17X24 6PK STRL BLUE (TOWEL DISPOSABLE) ×4 IMPLANT
TOWEL OR 17X26 10 PK STRL BLUE (TOWEL DISPOSABLE) ×4 IMPLANT
TRAY FOLEY IC TEMP SENS 14FR (CATHETERS) ×4 IMPLANT
TROCAR XCEL BLADELESS 5X75MML (TROCAR) ×4 IMPLANT
TROCAR XCEL NON-BLD 11X100MML (ENDOMECHANICALS) ×8 IMPLANT
TUBE CONNECTING 12X1/4 (SUCTIONS) ×4 IMPLANT
TUBE SUCT INTRACARD DLP 20F (MISCELLANEOUS) ×4 IMPLANT
TUNNELER SHEATH ON-Q 11GX8 (MISCELLANEOUS) IMPLANT
UNDERPAD 30X30 INCONTINENT (UNDERPADS AND DIAPERS) ×4 IMPLANT
VALVE MITRAL 29MM (Prosthesis & Implant Heart) ×4 IMPLANT
WATER STERILE IRR 1000ML POUR (IV SOLUTION) ×8 IMPLANT
WIRE BENTSON .035X145CM (WIRE) ×4 IMPLANT
YANKAUER SUCT BULB TIP NO VENT (SUCTIONS) ×12 IMPLANT

## 2012-07-07 NOTE — Progress Notes (Signed)
PAGED DR. SINGER  AND CALLED PHONE.  NO ANSWER.  PLEASE NOTE BP 89/57.

## 2012-07-07 NOTE — Op Note (Signed)
CARDIOTHORACIC SURGERY OPERATIVE NOTE  Date of Procedure:  07/07/2012  Preoperative Diagnosis: Severe Mitral Regurgitation and Mitral Stenosis  Postoperative Diagnosis: Same  Procedure:    Mitral Valve Replacement  Sorin Carbomedics Optiform mechanical prosthesis (size 29mm, catalog # [id number], serial # [id number])   Surgeon: Salvatore Decent. 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  The patient denies any known history of rheumatic fever.  However, the patient describes a several year history of progressive symptoms of exertional shortness of breath, orthopnea, and chronic dry nonproductive cough. She was evaluated several years ago by a pulmonologist and found not to have sleep apnea. Cardiac catheterization performed in 2010 was notable for the absence of coronary artery disease. The patient did not have an echocardiogram performed. She states that over the past few months her breathing has been getting worse. She has history of exertional shortness of breath that has seemed to get worse in the winter months. She has chronic orthopnea and has been sleeping in a chair for several years. These symptoms have gotten worse until she finally decided to quit smoking two months ago.  Despite stopping smoking her breathing continued to get worse, and she recently began to experience episodes of paroxysmal nocturnal dyspnea which typically would improve and the patient was set up for a period of time.  During this period time she also experienced episodes of tachypalpitations associated with relatively  brief episodes of acutely worse shortness of breath. Ultimately she was admitted to the hospital and noted to be in congestive heart failure with pulmonary edema and small bilateral pleural effusions. She was seen in consultation by Dr. Eden Emms and an echocardiogram was performed demonstrating normal left ventricular systolic function with moderate to severe mitral stenosis and moderate to severe mitral regurgitation. She underwent left and right heart catheterization by Dr. Shirlee Latch and was found to have normal coronary artery anatomy with no significant coronary artery disease. Pulmonary artery pressures were moderately elevated. 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 Cardiothoracic surgical consultation was requested to consider surgical intervention and I had the opportunity to see her in consultation on 06/22/2012 to discuss possible elective surgical intervention.  The patient has been seen in consultation and counseled at length regarding the indications, risks and potential benefits of surgery.  All questions have been answered, and the patient provides full informed consent for the operation as described.       DETAILS OF THE OPERATIVE PROCEDURE  The patient is brought to the operating room on the above mentioned date and central monitoring was established by the anesthesia team including placement of Swan-Ganz catheter through the left internal jugular vein.  A radial arterial line is placed. The patient is placed in the supine position on the operating table.  Intravenous antibiotics are administered. General endotracheal anesthesia is induced uneventfully. The patient is initially intubated using a dual lumen endotracheal tube.  A Foley catheter is placed.  Baseline transesophageal echocardiogram was performed.  Findings  were notable for classical rheumatic  mitral valve disease with severe mitral regurgitation and severe mitral stenosis. The mitral valve was heavily calcified and severely thickened. The subvalvular apparatus was severely thickened and foreshortened. There was type IIIa dysfunction of the mitral valve. Aortic valve was normal with trivial aortic regurgitation. Left ventricular systolic function was normal. There is moderate left atrial enlargement.  A soft roll is placed behind the patient's left scapula and the neck gently extended and turned to the left.   The patient's right neck, chest, abdomen, both groins, and both lower extremities are prepared and draped in a sterile manner. A time out procedure is performed.  A small incision is made in the right inguinal crease and the anterior surface of the right common femoral artery and right common femoral vein are identified.  There are severe adhesions around the femoral vessels presumably due to previous cardiac catheterization. The femoral artery is relatively small and moderately diseased.  A right miniature anterolateral thoracotomy incision is performed. The incision is placed just lateral to and superior to the right nipple. The pectoralis major muscle is retracted medially and completely preserved. The right pleural space is entered through the 3rd intercostal space. A soft tissue retractor is placed.  Exposure is felt to be inadequate do to the patient's extreme morbid obesity with inability to adequately decompress subcutaneous fat and breast tissue to facilitate small retractor placement for video thoracoscopic surgery. Given this finding and the relatively poor quality femoral artery a decision is made to abort the minimally invasive approach and convert to a full conventional sternotomy.  A median sternotomy incision is performed. The sternum is divided. The pericardium is opened. The ascending aorta is normal in appearance.  The patient is placed  in Trendelenburg position. The right internal jugular vein is cannulated with Seldinger technique and a guidewire advanced into the right atrium. The patient is heparinized systemically. The right internal jugular vein is cannulated with a 14 [place] pediatric femoral venous cannula. Pursestring sutures are placed on the anterior surface of the right common femoral vein. The right common femoral vein is cannulated with the Seldinger technique and a guidewire is advanced under transesophageal echocardiogram guidance through the right atrium. The femoral vein is cannulated with a long 22 French femoral venous cannula.  The ascending aorta is cannulated directly using a standard 7 mm arterial cannula.  Adequate heparinization is verified.     The entire pre-bypass portion of the operation was notable for stable hemodynamics.  Cardiopulmonary bypass was begun.  Vacuum assist venous drainage is utilized. The incision in the pericardium is extended in both directions. Venous drainage and exposure are notably excellent. Attempts to place a retrograde cardioplegia cannula through the right atrium into the coronary sinus are unsuccessful due to a valve obscuring the os of the coronary sinus.  An antegrade cardioplegia cannula is placed in the ascending aorta.    The patient is cooled to 28C systemic temperature.  The aortic cross clamp is applied and cold blood cardioplegia is delivered initially in an antegrade fashion through the aortic root. The initial cardioplegic arrest is rapid with early diastolic arrest.  Repeat doses of cardioplegia are administered intermittently every 20 to 30 minutes throughout the entire cross clamp portion of the operation through the aortic root in order to maintain completely flat electrocardiogram.  Myocardial protection was felt to be excellent.  A left atriotomy incision was performed through the interatrial groove and extended partially across the back wall of the left atrium  after opening the  oblique sinus inferiorly.  The mitral valve is exposed using a self-retaining retractor.  Findings are consistent with classical rheumatic mitral stenosis and mitral regurgitation with severe thickening and calcification of both the anterior and posterior leaflet. There is a classical fishmouth appearance of the valve.  The anterior leaflet of the mitral valve was excised sharply. All of the cords from the anterior leaflet to the posterior papillary muscle are severely diseased with calcification and foreshortening. They are all divided. There is some relatively normal primary cords from the anterior papillary muscle which are preserved on a separate pedicle which is subsequently incorporated into the annulus near the vicinity of the anterior commissure. The posterior leaflet is split in the midline. Much of the P2 and P3 portion of the posterior leaflet was severely calcified requiring debridement, the primary cords from all segments of the posterior leaflet are preserved.  Mitral valve replacement is performed using interrupted 2-0 Ethibond horizontal mattress pledgeted sutures with pledgets in the supra-annular position. The annulus was sized to accept a 29 mm mechanical prosthesis. The mitral valve was replaced using a Sorin [employer] Optiform mechanical prosthesis (size 29mm, catalog [id number], serial [id number]).  The valve was noted to seat normally and without difficulty. Normal leaflet mobility is verified. Rewarming is begun.  The atriotomy was closed using a 2-layer closure of running 3-0 Prolene suture after placing a sump drain across the mitral valve to serve as a left ventricular vent.  One final dose of warm retrograde "hot shot" cardioplegia was administered through the aortic root.  The aortic cross clamp was removed after a total cross clamp time of 89 minutes.  Epicardial pacing wires are fixed to the inferior wall of the right ventricule and to the right atrial  appendage. The patient is rewarmed to 37C temperature. The left ventricular vent is removed.  The antegrade cardioplegia cannula is now removed. The patient is weaned and disconnected from cardiopulmonary bypass.  The patient's rhythm at separation from bypass was sinus.  The patient was weaned from bypass on low dose milrinone and dopamine infusions.  Total cardiopulmonary bypass time for the operation was 138 minutes.  Followup transesophageal echocardiogram performed after separation from bypass revealed a well seated mechanical prosthesis in the mitral position that was functioning normally.  There was no paravalvular leak.  Left ventricular function was unchanged from preoperatively.    The aortic and femoral venous cannulae were removed uneventfully.  Protamine was administered to reverse the anticoagulation. The right internal jugular cannula was removed and manual pressure held on the neck for 15 minutes.  The mediastinum was irrigated with saline solution and inspected for hemostasis. The mediastinum and both pleural spaces are drained with a combination of 4 chest tubes placed through separate stab incisions. The median sternotomy was closed with double strength sternal wire. The soft tissues anterior to the sternum were closed in multiple layers and the skin incision closed with subcuticular skin closure.   The miniature thoracotomy incision was closed in multiple layers in routine fashion. The right groin incision was inspected for hemostasis and closed in multiple layers in routine fashion.  The patient tolerated the procedure well.  The patient was reintubated using a single lumen endotracheal tube and subsequently transported to the surgical intensive care unit in stable condition. There were no intraoperative complications. All sponge instrument and needle counts are verified correct at completion of the operation.  The post-bypass portion of the operation was notable for stable rhythm and  hemodynamics.  No blood  products were administered during the operation.   Salvatore Decent. Cornelius Moras MD 07/07/2012 4:09 PM

## 2012-07-07 NOTE — Preoperative (Signed)
Beta Blockers   Reason not to administer Beta Blockers:Not Applicable 

## 2012-07-07 NOTE — OR Nursing (Signed)
2nd call to SICU - spoke to [facility]

## 2012-07-07 NOTE — Progress Notes (Signed)
Recruitment done per MD, no complications noted.

## 2012-07-07 NOTE — OR Nursing (Signed)
14:15 - call to vol. Desk to inform family off pump.  1st call to SICU spoke to [facility].

## 2012-07-07 NOTE — Transfer of Care (Signed)
Immediate Anesthesia Transfer of Care Note  Patient: Priscilla Stevens  Procedure(s) Performed: Procedure(s): INTRAOPERATIVE TRANSESOPHAGEAL ECHOCARDIOGRAM (N/A) MITRAL VALVE (MV) REPLACEMENT (N/A)  Patient Location: PACU and SICU  Anesthesia Type:General  Level of Consciousness: unresponsive  Airway & Oxygen Therapy: Patient remains intubated per anesthesia plan and Patient placed on Ventilator (see vital sign flow sheet for setting)  Post-op Assessment: Report given to PACU RN and Post -op Vital signs reviewed and stable  Post vital signs: Reviewed and stable  Complications: No apparent anesthesia complications

## 2012-07-07 NOTE — Brief Op Note (Addendum)
07/07/2012  1:28 PM  PATIENT:  Priscilla Stevens  62 y.o. 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

## 2012-07-07 NOTE — Interval H&P Note (Signed)
History and Physical Interval Note:  07/07/2012 7:49 AM  Priscilla Stevens  has presented today for surgery, with the diagnosis of MR MS  The various methods of treatment have been discussed with the patient and family. After consideration of risks, benefits and other options for treatment, the patient has consented to  Procedure(s): MINIMALLY INVASIVE MITRAL VALVE (MV) REPLACEMENT (Right) INTRAOPERATIVE TRANSESOPHAGEAL ECHOCARDIOGRAM (N/A) as a surgical intervention .  The patient's history has been reviewed, patient examined, no change in status, stable for surgery.  I have reviewed the patient's chart and labs.  Questions were answered to the patient's satisfaction.     [name]

## 2012-07-07 NOTE — Anesthesia Preprocedure Evaluation (Addendum)
Anesthesia Evaluation  Patient identified by MRN, date of birth, ID band Patient awake    Reviewed: Allergy & Precautions, H&P , NPO status , Patient's Chart, lab work & pertinent test results, reviewed documented beta blocker date and time   History of Anesthesia Complications (+) PONV  Airway Mallampati: II TM Distance: >3 FB Neck ROM: Full    Dental  (+) Teeth Intact and Dental Advisory Given   Pulmonary neg pneumonia -, former smoker (quit recently),  breath sounds clear to auscultation  Pulmonary exam normal       Cardiovascular hypertension, Pt. on medications and Pt. on home beta blockers +CHF - dysrhythmias + Valvular Problems/Murmurs (moderate mitral stenosis with severe mitral regurg) MR Rhythm:Regular Rate:Normal  Mitral valve: The mitral valve and apparatus (particularly   the posterior leaflet) were heavily calcified. The   anterior leaflet had a hockey stick appearance suggestive   of rheumatic valve disease. There was moderate to severe   mitral regurgitation and moderate mitral stenosis. Mean   gradient: 13mm Hg (D). Peak gradient: 20mm Hg (D). Valve area by pressure half-time: 1.35cm^2. EF 60-65%   Neuro/Psych  Headaches, Anxiety  Neuromuscular disease    GI/Hepatic Neg liver ROS, GERD-  Medicated and Controlled,  Endo/Other  negative endocrine ROSMorbid obesity  Renal/GU negative Renal ROS     Musculoskeletal  (+) Fibromyalgia -, narcotic dependent  Abdominal (+) + obese,   Peds  Hematology  (+) Blood dyscrasia (CLL), ,   Anesthesia Other Findings   Reproductive/Obstetrics                         Anesthesia Physical Anesthesia Plan  ASA: III  Anesthesia Plan: General   Post-op Pain Management:    Induction: Intravenous  Airway Management Planned: Oral ETT  Additional Equipment: Arterial line, CVP, 3D TEE, PA Cath and Ultrasound Guidance Line Placement  Intra-op  Plan:   Post-operative Plan: Post-operative intubation/ventilation  Informed Consent: I have reviewed the patients History and Physical, chart, labs and discussed the procedure including the risks, benefits and alternatives for the proposed anesthesia with the patient or authorized representative who has indicated his/her understanding and acceptance.   Dental advisory given  Plan Discussed with: CRNA and Surgeon  Anesthesia Plan Comments: (Plan routine monitors, A line, PA cath, GETA with TEE, post op ventilation)        Anesthesia Quick Evaluation

## 2012-07-07 NOTE — Progress Notes (Signed)
[facility] BRIEF SICU PROGRESS NOTE  Day of Surgery  S/P Procedure(s) (LRB): INTRAOPERATIVE TRANSESOPHAGEAL ECHOCARDIOGRAM (N/A) MITRAL VALVE (MV) REPLACEMENT (N/A)   Sedated on vent NSR w/ stable hemodynamics Excellent UOP and minimal chest tube output Hypercarbia on ABG w/ somewhat high airway pressures CXR looks okay  Plan: Switch to [facility] mode ventilation for now w/ increased rate, acute vent wean later if improved  OWEN,CLARENCE H 07/07/2012 5:27 PM

## 2012-07-07 NOTE — Progress Notes (Signed)
  Echocardiogram Echocardiogram Transesophageal has been performed.  Priscilla Stevens 07/07/2012, 9:13 AM

## 2012-07-07 NOTE — H&P (Signed)
CARDIOTHORACIC SURGERY HISTORY AND PHYSICAL EXAM  PCP is [name], MD Referring Provider is  [facility], Eliot Ford, MD   Reason for consultation:  Mitral stenosis and mitral regurgitation   HPI:  Patient is a 62 year old obese white female from [place] with no previous history of heart murmur nor any previous diagnosis of congestive heart failure.  The patient denies any known history of rheumatic fever.  However, the patient describes a several year history of progressive symptoms of exertional shortness of breath, orthopnea, and chronic dry nonproductive cough. She was evaluated several years ago by a pulmonologist and found not to have sleep apnea. Cardiac catheterization performed in 2010 was notable for the absence of coronary artery disease. The patient did not have an echocardiogram performed. She states that over the past few months her breathing has been getting worse. She has history of exertional shortness of breath that has seemed to get worse in the winter months. She has chronic orthopnea and has been sleeping in a chair for several years. These symptoms have gotten worse until she finally decided to quit smoking two months ago.  Despite stopping smoking her breathing continued to get worse, and she recently began to experience episodes of paroxysmal nocturnal dyspnea which typically would improve and the patient was set up for a period of time.  During this period time she also experienced episodes of tachypalpitations associated with relatively brief episodes of acutely worse shortness of breath. Ultimately she had a particularly severe episode of shortness of breath at rest which persisted, prompting her to call EMS.  She was admitted to the hospital 2 weeks ago and noted to be in congestive heart failure with pulmonary edema and small bilateral pleural effusions. CT angiogram of the chest was performed and notably absent for signs of pulmonary embolus. Troponin levels  were borderline elevated. She was seen in consultation by Dr. Eden Emms and an echocardiogram was performed demonstrating normal left ventricular systolic function with moderate to severe mitral stenosis and moderate to severe mitral regurgitation. She underwent left and right heart catheterization by Dr. Shirlee Latch and was found to have normal coronary artery anatomy with no significant coronary artery disease. Pulmonary artery pressures were moderately elevated. 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 Cardiothoracic surgical consultation was requested to consider surgical intervention and I had the opportunity to see her in consultation on 06/22/2012 to discuss possible elective surgical intervention.    She returns to the office today with tentatively plans to proceed with elective mitral valve replacement later this week.  The patient lives a somewhat sedentary lifestyle and she admits that she does not exercise much. She was placed on disability because of chronic cough by her pulmonologist a few years ago without having an echocardiogram performed.  She has chronic exertional shortness of breath which has gotten worse culminating in her acute exacerbation of chronic diastolic congestive heart failure causing her recent admission. The patient has never had any chest pain or chest tightness either with activity or at rest. She has chronic orthopnea with episodes of PND. She has not had lower extremity edema. She has occasional dizzy spell without syncope. She has had chronic palpitations which have been worse recently. She has no previous documented history of atrial fibrillation or other arrhythmia.  She reports no new problems since she was discharged  from the hospital. She notes that her breathing is much better than it had been  previously. She is having less palpitations. She denies resting shortness of breath, PND, orthopnea, or dizzy spells, nor syncope. She has never had any chest pain. She has chronic exertional shortness of breath.  She has been taking Xarelto.   Past Medical History  Diagnosis Date  . Anxiety   . Cough     Eval by pulm in past: essentially normal PFTs, cough ddx was variant asthma, upper airway cough syndrome (previously termed post nasal drip syndrome) or GERD,   . Diverticulitis   . Severe mitral regurgitation 06/20/2012  . Mitral stenosis 06/20/2012  . Morbid obesity 06/20/2012  . History of tobacco abuse 06/22/2012  . Acute on chronic diastolic heart failure 06/22/2012  . Complication of anesthesia   . PONV (postoperative nausea and vomiting)   . Pneumonia     2-3 YRS AGO   . Heart murmur   . CHF (congestive heart failure)   . GERD (gastroesophageal reflux disease)   . Headache     HX MIGRAINES   . Fibromyalgia   . CLL (chronic lymphocytic leukemia)   . Dysrhythmia     FAST HB TAKING METOPROLOL NOT FOR HTN    Past Surgical History  Procedure Laterality Date  . Appendectomy    . Total vaginal hysterectomy    . Tonsillectomy    . Tee without cardioversion N/A 06/22/2012    Procedure: TRANSESOPHAGEAL ECHOCARDIOGRAM (TEE);  Surgeon: Laurey Morale, MD;  Location: [facility] ENDOSCOPY;  Service: Cardiovascular;  Laterality: N/A;  . No past surgeries      TUBAL PREG     Family History  Problem Relation Age of Onset  . Congestive Heart Failure Mother     Social History History  Substance Use Topics  . Smoking status: Former Smoker -- 1.00 packs/day for 30 years    Quit date: 04/06/2012  . Smokeless tobacco: Not on file     Comment: Quit January 2014. Smoked for 25 yrs.  . Alcohol Use: 0.0 oz/week     Comment: 2x/year    Prior to Admission medications   Medication Sig Start Date End Date Taking? Authorizing Provider  amiodarone (PACERONE) 200 MG tablet Take 1 tablet (200 mg  total) by mouth 2 (two) times daily after a meal. 06/23/12  Yes Erick Blinks, MD  ARIPiprazole (ABILIFY) 5 MG tablet Take 7.5 mg by mouth daily.   Yes Historical Provider, MD  atorvastatin (LIPITOR) 10 MG tablet Take 10 mg by mouth daily.   Yes Historical Provider, MD  calcium carbonate (OS-CAL) 600 MG TABS Take 600 mg by mouth daily.   Yes Historical Provider, MD  furosemide (LASIX) 40 MG tablet Take 1 tablet (40 mg total) by mouth daily. 06/23/12  Yes Erick Blinks, MD  HYDROcodone-acetaminophen (NORCO/VICODIN) 5-325 MG per tablet Take 1 tablet by mouth every 6 (six) hours as needed for pain.   Yes Historical Provider, MD  Lansoprazole (PREVACID PO) Take 1 tablet by mouth daily. OTC   Yes Historical Provider, MD  metoprolol succinate (TOPROL-XL) 25 MG 24 hr tablet Take 1 tablet (25 mg total) by mouth 2 (two) times daily. 06/23/12  Yes Erick Blinks, MD  Rivaroxaban (XARELTO) 20 MG TABS Take 1 tablet (20 mg total) by mouth daily with supper. 06/23/12  Yes Erick Blinks, MD  traMADol (ULTRAM) 50 MG tablet Take 50 mg by mouth every 8 (eight) hours as needed for pain.   Yes Historical  Provider, MD  traZODone (DESYREL) 100 MG tablet Take 100 mg by mouth at bedtime.   Yes Historical Provider, MD  venlafaxine XR (EFFEXOR-XR) 150 MG 24 hr capsule Take 1 capsule (150 mg total) by mouth daily. 06/23/12  Yes Erick Blinks, MD  ALPRAZolam Prudy Feeler) 0.25 MG tablet Take 1 tablet (0.25 mg total) by mouth at bedtime as needed for sleep or anxiety. 07/04/12   Purcell Nails, MD  potassium chloride SA (K-DUR,KLOR-CON) 20 MEQ tablet Take 1 tablet (20 mEq total) by mouth daily. 07/01/12   Rosalio Macadamia, NP  promethazine (PHENERGAN) 25 MG tablet Take 25 mg by mouth as needed for nausea.    Historical Provider, MD    Allergies  Allergen Reactions  . Aspirin     REACTION: GI upset     Review of Systems:              General:                      normal appetite, decreased energy, no weight gain, no weight loss,  no fever             Cardiac:                      no chest pain with exertion, no chest pain at rest, + SOB with mild-moderate exertion, + resting SOB prior to admission, + PND, + orthopnea, + palpitations, no arrhythmia, no atrial fibrillation, no LE edema, + dizzy spells, no syncope             Respiratory:                + shortness of breath, no home oxygen, no productive cough, + chronic dry cough, no bronchitis, no wheezing, no hemoptysis, no asthma, no pain with inspiration or cough, no sleep apnea, no CPAP at night             GI:                                no difficulty swallowing, + reflux, no frequent heartburn, no hiatal hernia, no abdominal pain, no constipation, no diarrhea, no hematochezia, no hematemesis, no melena             GU:                              no dysuria,  no frequency, no urinary tract infection, no hematuria, no kidney stones, no kidney disease             Vascular:                     no pain suggestive of claudication, no pain in feet, no leg cramps, no varicose veins, no DVT, no non-healing foot ulcer             Neuro:                         no stroke, no TIA's, no seizures, no headaches, no temporary blindness one eye,  no slurred speech, ? peripheral neuropathy with chronic numbness right 4th and 5th fingers and ulnar aspect right hand, + chronic pain, no instability of gait, no memory/cognitive dysfunction  Musculoskeletal:         + arthritis, no joint swelling, + myalgias, no difficulty walking, decreased mobility               Skin:                            no rash, no itching, no skin infections, no pressure sores or ulcerations             Psych:                         + anxiety, no depression, no nervousness, no unusual recent stress             Eyes:                           no blurry vision, no floaters, no recent vision changes, + wears glasses or contacts             ENT:                            no hearing loss, no loose or painful  teeth, no dentures, last saw dentist within the past year             Hematologic:               no easy bruising, no abnormal bleeding, no clotting disorder, no frequent epistaxis             Endocrine:                   + borderline diabetes, does not check CBG's at home                           Physical Exam:    BP 82/58  Pulse 72  Temp(Src) 97.8 F (36.6 C) (Oral)  Resp 20  Wt 97.977 kg (216 lb)  BMI 35.94 kg/m2  SpO2 95%              General:                      Obese but o/w well-appearing             HEENT:                       Unremarkable               Neck:                           no JVD, no bruits, no adenopathy               Chest:                         clear to auscultation, symmetrical breath sounds, no wheezes, no rhonchi               CV:                              RRR, grade III/VI harsh systolic murmur  Abdomen:                    soft, non-tender, no masses               Extremities:                 warm, well-perfused, pulses diminished, no LE edema             Rectal/GU                   Deferred             Neuro:                         Grossly non-focal and symmetrical throughout             Skin:                            Clean and dry, no rashes, no breakdown   Diagnostic Tests:  Transthoracic Echocardiography  Patient: Priscilla, Stevens MR #: [id number] Study Date: 06/20/2012 Gender: F Age: 76 Height: 165.1cm Weight: 100kg BSA: 2.36m^2 Pt. Status: Room: [facility]  PERFORMING [place], [facility] SONOGRAPHER [place], RCS Rico Junker, [place] cc:  ------------------------------------------------------------ LV EF: 55% - 60%  ------------------------------------------------------------ Indications: CHF - 428.0. Tachycardia 785.0.  ------------------------------------------------------------ History: Risk factors: Morbidly obese.  ------------------------------------------------------------ Study  Conclusions  - Left ventricle: The cavity size was normal. Wall thickness was normal. Systolic function was normal. The estimated ejection fraction was in the range of 55% to 60%. Regional wall motion abnormalities cannot be excluded. - Aortic valve: Trivial regurgitation. - Mitral valve: Calcified annulus. Moderate to severe regurgitation. Valve area by pressure half-time: 1.81cm^2. Valve area by continuity equation (using LVOT flow): 0.67cm^2. Impressions:  - Normal LV function; severe MAC with moderate to severe MR; mild MS by pressure half time but severe by mean gradient (may be elevated secondary to MR); suggest TEE to further assess. Transthoracic echocardiography. M-mode, complete 2D, spectral Doppler, and color Doppler. Height: Height: 165.1cm. Height: 65in. Weight: Weight: 100kg. Weight: 220lb. Body mass index: BMI: 36.7kg/m^2. Body surface area: BSA: 2.79m^2. Blood pressure: 127/83. Patient status: Inpatient. Location: Echo laboratory.  ------------------------------------------------------------  ------------------------------------------------------------ Left ventricle: The cavity size was normal. Wall thickness was normal. Systolic function was normal. The estimated ejection fraction was in the range of 55% to 60%. Regional wall motion abnormalities cannot be excluded.  ------------------------------------------------------------ Aortic valve: Trileaflet; normal thickness leaflets. Mobility was not restricted. Doppler: Transvalvular velocity was within the normal range. There was no stenosis. Trivial regurgitation.  ------------------------------------------------------------ Aorta: Aortic root: The aortic root was normal in size.  ------------------------------------------------------------ Mitral valve: Calcified annulus. Mobility was not restricted. Doppler: Transvalvular velocity was within the normal range. There was no evidence for stenosis.  Moderate to severe regurgitation. Valve area by pressure half-time: 1.81cm^2. Indexed valve area by pressure half-time: 0.83cm^2/m^2. Valve area by continuity equation (using LVOT flow): 0.67cm^2. Indexed valve area by continuity equation (using LVOT flow): 0.31cm^2/m^2. Mean gradient: 17mm Hg (D). Peak gradient: 18mm Hg (D).  ------------------------------------------------------------ Left atrium: The atrium was normal in size.  ------------------------------------------------------------ Right ventricle: The cavity size was normal. Systolic function was normal.  ------------------------------------------------------------ Pulmonic valve: Doppler: Transvalvular velocity was within the normal range. There was no evidence for stenosis.  ------------------------------------------------------------ Tricuspid valve: Structurally normal valve. Doppler: Transvalvular velocity was within the normal range. Trivial regurgitation.  ------------------------------------------------------------  Right atrium: The atrium was normal in size.  ------------------------------------------------------------ Pericardium: There was no pericardial effusion.  ------------------------------------------------------------ Systemic veins: Inferior vena cava: The vessel was normal in size.  ------------------------------------------------------------  2D measurements Normal Doppler measurements Norma Left ventricle l LVID ED, 43.1 mm 43-52 LVOT chord, Peak vel, 102 cm/s ----- PLAX S LVID ES, 25.7 mm 23-38 VTI, S 14.1 cm ----- chord, Mitral valve PLAX Peak E 213.11 cm/s ----- FS, chord, 40 % >29 vel PLAX Mean vel, 198 cm/s ----- LVPW, ED 9.42 mm ------ D IVS/LVPW 1.01 <1.3 Decelerat 507.54 cm/s^2 ----- ratio, ED ion slope Ventricular septum Decelerat 420 ms 150-2 IVS, ED 9.55 mm ------ ion time 30 LVOT Pressure 122 ms ----- Diam, S 20 mm ------ half-time Area 3.14 cm^2 ------ Mean 17 mm Hg  ----- Aorta gradient, Root diam, 32 mm ------ D ED Peak 18 mm Hg ----- Left atrium gradient, AP dim 39 mm ------ D AP dim 1.78 cm/m^2 <2.2 Area 1.81 cm^2 ----- index (PHT) Area 0.83 cm^2/m ----- index ^2 (PHT) Area 0.67 cm^2 ----- (LVOT) continuit y Area 0.31 cm^2/m ----- index ^2 (LVOT cont) Annulus 66.5 cm ----- VTI Right ventricle Sa vel, 15.2 cm/s ----- lat ann, tiss DP  ------------------------------------------------------------ Prepared and Electronically Authenticated by  Olga Millers 2014-03-17T12:59:22.910   Cardiac Catheterization Procedure Note  Name: TIFFANI KADOW   MRN: [id number]   DOB: 14-Oct-1950   Procedure: Right Heart Cath, Left Heart Cath, Selective Coronary Angiography, LV angiography   Indication: Mitral valve disease   Procedural Details: The right groin was prepped, draped, and anesthetized with 1% lidocaine. Using the modified Seldinger technique a 5 French sheath was placed in the right femoral artery and a 7 French sheath was placed in the right femoral vein. A Swan-Ganz catheter was used for the right heart catheterization. Standard protocol was followed for recording of right heart pressures and sampling of oxygen saturations. Fick cardiac output was calculated. MP catheter was used for selective coronary angiography and pigtail catheter was used for left ventriculography. There were no immediate procedural complications. The patient was transferred to the post catheterization recovery area for further monitoring.   Procedural Findings:   Hemodynamics (mmHg)   RA mean 13   RV 47/14   PA 50/25, mean 31   PCWP mean 21   LV 102/11   AO 98/65   Oxygen saturations:   PA 57%   AO 96%   Cardiac Output (Fick) 4.16 L/min   Cardiac Index (Fick) 2.04   PVR 2.4 WU   Mean gradient mitral valve 12.8 mmHg   MVA 1.07 cm^2   Coronary angiography:   Coronary dominance: right   Left mainstem: No significant disease.   Left anterior  descending (LAD): No significant disease.   Left circumflex (LCx): 30% ostial LCx.   Right coronary artery (RCA): No significant disease.   Left ventriculography: Left ventricular systolic function is normal, LVEF is estimated at 60-65% with no wall motion abnormalities in the RAO projection. 3+ mitral regurgitation.   Final Conclusions: No angiographic coronary disease. Moderate to severe mitral regurgitation, probably moderate mitral stenosis. Awaiting TEE. Will consult CVTS for evaluation. Will continue current IV Lasix dosing today.   Marca Ancona   06/21/2012, 11:45 AM    Transesophageal Echocardiography  Patient:    Priscilla, Stevens MR #:       [id number] Study Date: 06/22/2012 Gender:     F Age:        64 Height:  165.1cm Weight:     96.8kg BSA:        2.70m^2 Pt. Status: Room:       4740    PERFORMING   Marca Ancona  SONOGRAPHER  Dewitt Hoes, RDCS  ATTENDING    [place], [place]  ORDERING     Dunn, Dayna  REFERRING    Dunn, Dayna  ADMITTING    Earlie Lou cc:  ------------------------------------------------------------ LV EF: 60% -   65%  ------------------------------------------------------------ Indications:      Mitral regurgitation 424.0.  ------------------------------------------------------------ Study Conclusions  - Left ventricle: The cavity size was normal. Wall thickness   was increased in a pattern of mild LVH. Systolic function   was normal. The estimated ejection fraction was in the   range of 60% to 65%. Wall motion was normal; there were no   regional wall motion abnormalities. - Aortic valve: There was no stenosis. - Aorta: Normal caliber aorta with grade III plaque in the   aortic arch. - Mitral valve: The mitral valve and apparatus (particularly   the posterior leaflet) were heavily calcified. The   anterior leaflet had a hockey stick appearance suggestive   of rheumatic valve disease. There was moderate to severe   mitral  regurgitation and moderate mitral stenosis. Mean   gradient: 13mm Hg (D). Peak gradient: 20mm Hg (D). Valve   area by pressure half-time: 1.35cm^2. - Left atrium: The atrium was moderately dilated. No   evidence of thrombus in the atrial cavity or appendage. - Pulmonary veins: Systolic flow reversal on the pulmonary   vein doppler pattern was not noted. - Right ventricle: The cavity size was normal. Systolic   function was normal. - Right atrium: No evidence of thrombus in the atrial cavity   or appendage. - Atrial septum: There was increased thickness of the   septum, consistent with lipomatous hypertrophy. No defect   or patent foramen ovale was identified. Echo contrast   study showed no right-to-left atrial level shunt, at   baseline or with provocation. Transesophageal echocardiography.  2D and color Doppler. Height:  Height: 165.1cm. Height: 65in.  Weight:  Weight: 96.8kg. Weight: 213lb.  Body mass index:  BMI: 35.5kg/m^2. Body surface area:    BSA: 2.59m^2.  Blood pressure: 91/50.  Patient status:  Inpatient.  Location:  Endoscopy.   ------------------------------------------------------------  ------------------------------------------------------------ Left ventricle:  The cavity size was normal. Wall thickness was increased in a pattern of mild LVH. Systolic function was normal. The estimated ejection fraction was in the range of 60% to 65%. Wall motion was normal; there were no regional wall motion abnormalities.  ------------------------------------------------------------ Aortic valve:   Trileaflet.  Doppler:   There was no stenosis.    No regurgitation.  ------------------------------------------------------------ Aorta:  Normal caliber aorta with grade III plaque in the aortic arch.  ------------------------------------------------------------ Mitral valve:  The mitral valve and apparatus (particularly the posterior leaflet) were heavily calcified. The  anterior leaflet had a hockey stick appearance suggestive of rheumatic valve disease. There was moderate to severe mitral regurgitation and moderate mitral stenosis.  Doppler: Valve area by pressure half-time: 1.35cm^2. Indexed valve area by pressure half-time: 0.63cm^2/m^2.    Mean gradient: 13mm Hg (D). Peak gradient: 20mm Hg (D).  ------------------------------------------------------------ Left atrium:  The atrium was moderately dilated.  No evidence of thrombus in the atrial cavity or appendage.  ------------------------------------------------------------ Atrial septum:  There was increased thickness of the septum, consistent with lipomatous hypertrophy. No defect or patent foramen ovale was identified.  Echo contrast study  showed no right-to-left atrial level shunt, at baseline or with provocation.  ------------------------------------------------------------ Pulmonary veins:  Systolic flow reversal on the pulmonary vein doppler pattern was not noted.  ------------------------------------------------------------ Right ventricle:  The cavity size was normal. Systolic function was normal.  ------------------------------------------------------------ Pulmonic valve:    Structurally normal valve.   Cusp separation was normal.  ------------------------------------------------------------ Tricuspid valve:   Doppler:   Trivial regurgitation.  ------------------------------------------------------------ Right atrium:  The atrium was normal in size.  No evidence of thrombus in the atrial cavity or appendage.  ------------------------------------------------------------ Pericardium:  There was no pericardial effusion.   ------------------------------------------------------------ Post procedure conclusions Ascending Aorta:  - Normal caliber aorta with grade III plaque in the aortic   arch.  ------------------------------------------------------------  Doppler  measurements   Norma                        l Mitral valve Mean vel, 177 cm/s     ----- D Pressure  163 ms       ----- half-time Mean       13 mm Hg    ----- gradient, D Peak       20 mm Hg    ----- gradient, D Area      1.3 cm^2     ----- (PHT)       5 Area      0.6 cm^2/m^2 ----- index       3 (PHT) Annulus   54. cm       ----- VTI         5 Max       578 cm/s     ----- regurg vel Regurg    149 cm       ----- VTI   ------------------------------------------------------------ Prepared and Electronically Authenticated by  Marca Ancona 2014-03-19T22:55:[id number]       CT ANGIOGRAPHY ABDOMEN AND PELVIS  Technique: Multidetector CT imaging through the abdomen and pelvis  was performed using the standard protocol during bolus  administration of intravenous contrast. Multiplanar reconstructed  images including MIPs were obtained and reviewed to evaluate the  vascular anatomy.  Contrast: 80mL OMNIPAQUE IOHEXOL 350 MG/ML. The initial imaging  sequence did not result in a good contrast bolus in the aorta.  Therefore, the images were repeated, and a total of 160 ml  Omnipaque 350 was utilized.  Comparison: None.  Findings: The contrast bolus in the abdominal aorta and the  bilateral iliofemoral arteries is adequate. Moderate calcified and  noncalcified plaque in the suprarenal and infrarenal abdominal  aorta without evidence of aneurysm, dissection, or stenosis.  Widely patent celiac and superior mesenteric arteries. Calcified  plaque at the origin of the IMA without visible stenosis. Single  renal arteries supplying each kidney; the right main renal artery  is widely patent, and there is calcified plaque at the origin of  the left main renal artery without visible stenosis. Moderate to  extensive calcified plaque involving the common iliac arteries  bilaterally, without evidence of hemodynamically significant  stenosis. Calcified plaque involving the common femoral  arteries  bilaterally without stenosis. (Soft tissue stranding adjacent to  the right common femoral artery is presumably related to recent  cardiac catheterization.)  Diffuse hepatic steatosis. Approximate 1.9 cm simple cyst in the  caudate lobe of the liver; no significant focal hepatic parenchymal  abnormalities. Normal-appearing spleen allowing for the early  arterial phase of enhancement. Normal pancreas, adrenal glands,  and kidneys. No significant lymphadenopathy. Gallbladder  contracted  as there is food within the stomach. No biliary ductal  dilation.  Stomach normal in appearance. Inspissated stool-like material  ovary several centimeter segment of the distal and terminal ileum.  Small bowel otherwise normal in appearance. Scattered diverticula  involving the ascending and transverse colon, with more extensive  diverticulosis involving the distal descending and sigmoid colon.  No evidence of acute diverticulitis. Appendix surgically absent.  No ascites.  Urinary bladder decompressed and unremarkable. Uterus surgically  absent. No adnexal masses or free pelvic fluid.  Bone window images demonstrate mild degenerative changes involving  the lower thoracic spine. Visualized lung bases clear, though  there are scattered areas of focal hyperlucency. Heart mildly  enlarged with mitral valvular calcification.  Review of the MIP images confirms the above findings.  IMPRESSION:  1. Moderate aorto-iliofemoral atherosclerosis without aneurysm,  dissection, or hemodynamically significant stenosis.  2. 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  3. Diffuse hepatic steatosis.  4. Colonic diverticulosis without evidence of acute  diverticulitis.  5. Inspissated stool-like material over a long segment of the  distal and terminal ileum, consistent with stasis.  6. Scattered areas of focal hyperlucency in the visualized lung  bases consistent with localized air trapping as can be seen in   patients with asthma.  Original Report Authenticated By: Hulan Saas, M.D.    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 She describes a long history of symptoms consistent with chronic diastolic congestive heart failure with acute exacerbation causing her current hospitalization with class IV symptoms. She has improved rapidly with diuretic therapy. Left ventricular systolic function is well-preserved and the patient does not have significant coronary artery disease. Her valve appears to be rheumatic and will unquestionably need to be replaced. Risks associated with surgical intervention will be somewhat elevated do to the patient's obesity, long-standing history of tobacco abuse with the possibility of significant COPD, and history of CLL. She appears to be an acceptable candidate for minimally invasive approach for surgery.  She has been taking Xarelto.   Plan:  I have again reviewed the rationale for elective mitral valve replacement with the patient and her family here in the office today. Alternative surgical approaches been discussed including a comparison between conventional sternotomy and minimally invasive techniques. The relative risks and benefits of each have been reviewed. Because the patient has not yet stopped taking the relative we will postpone surgery 1 day to Thursday, April 3. She we'll otherwise continue all other medications as currently prescribed. The patient and her family understand and accept all potential associated risks of surgery including but not limited to risk of death, stroke, myocardial infarction, congestive heart failure, respiratory failure, renal failure, bleeding requiring blood transfusion, arrhythmia, heart block or bradycardia requiring permanent pacemaker, or late complications related to valve replacement. The patient specifically requests that  her valve be replaced using a mechanical prosthesis and she understands that this will imply that she will need to remain anticoagulated on Coumadin for the remainder of her life. All of her questions been addressed.    Salvatore Decent. Cornelius Moras, MD

## 2012-07-08 ENCOUNTER — Inpatient Hospital Stay (HOSPITAL_COMMUNITY): Payer: Medicare Other

## 2012-07-08 DIAGNOSIS — Z952 Presence of prosthetic heart valve: Secondary | ICD-10-CM | POA: Insufficient documentation

## 2012-07-08 LAB — BASIC METABOLIC PANEL
BUN: 8 mg/dL (ref 6–23)
CO2: 25 mEq/L (ref 19–32)
Chloride: 107 mEq/L (ref 96–112)
GFR calc non Af Amer: >90 mL/min (ref >90)
Glucose, Bld: 102 mg/dL — ABNORMAL HIGH (ref 70–99)
Potassium: 4.9 mEq/L (ref 3.5–5.1)
Sodium: 138 mEq/L (ref 135–145)

## 2012-07-08 LAB — POCT I-STAT 3, ART BLOOD GAS (G3+)
Acid-base deficit: 1 mmol/L (ref 0.0–2.0)
Bicarbonate: 26 mEq/L — ABNORMAL HIGH (ref 20.0–24.0)
O2 Saturation: 91 %
O2 Saturation: 96 %
Patient temperature: 37.5
Patient temperature: 97.7
TCO2: 27 mmol/L (ref 0–100)
pO2, Arterial: 69 mmHg — ABNORMAL LOW (ref 80.0–100.0)

## 2012-07-08 LAB — CBC
HCT: 28.5 % — ABNORMAL LOW (ref 36.0–46.0)
Hemoglobin: 9.8 g/dL — ABNORMAL LOW (ref 12.0–15.0)
Hemoglobin: 10.2 g/dL — ABNORMAL LOW (ref 12.0–15.0)
MCHC: 34.4 g/dL (ref 30.0–36.0)
Platelets: 67 10*3/uL — ABNORMAL LOW (ref 150–400)
RBC: 3.41 MIL/uL — ABNORMAL LOW (ref 3.87–5.11)
RBC: 3.55 MIL/uL — ABNORMAL LOW (ref 3.87–5.11)
WBC: 19.5 10*3/uL — ABNORMAL HIGH (ref 4.0–10.5)

## 2012-07-08 LAB — POCT I-STAT, CHEM 8
Creatinine, Ser: 0.9 mg/dL (ref 0.50–1.10)
Glucose, Bld: 114 mg/dL — ABNORMAL HIGH (ref 70–99)
Hemoglobin: 10.5 g/dL — ABNORMAL LOW (ref 12.0–15.0)
Sodium: 139 mEq/L (ref 135–145)
TCO2: 26 mmol/L (ref 0–100)

## 2012-07-08 LAB — GLUCOSE, CAPILLARY
Glucose-Capillary: 112 mg/dL — ABNORMAL HIGH (ref 70–99)
Glucose-Capillary: 131 mg/dL — ABNORMAL HIGH (ref 70–99)
Glucose-Capillary: 133 mg/dL — ABNORMAL HIGH (ref 70–99)
Glucose-Capillary: 143 mg/dL — ABNORMAL HIGH (ref 70–99)
Glucose-Capillary: 85 mg/dL (ref 70–99)
Glucose-Capillary: 92 mg/dL (ref 70–99)

## 2012-07-08 LAB — CREATININE, SERUM
Creatinine, Ser: 0.97 mg/dL (ref 0.50–1.10)
GFR calc Af Amer: 73 mL/min — ABNORMAL LOW (ref >90)

## 2012-07-08 MED ORDER — INSULIN ASPART 100 UNIT/ML ~~LOC~~ SOLN
0.0000 [IU] | SUBCUTANEOUS | Status: DC
  Administered 2012-07-10: 2 [IU] via SUBCUTANEOUS

## 2012-07-08 MED ORDER — MORPHINE SULFATE 2 MG/ML IJ SOLN
2.0000 mg | INTRAMUSCULAR | Status: DC | PRN
  Administered 2012-07-09 – 2012-07-10 (×4): 2 mg via INTRAVENOUS
  Filled 2012-07-08 (×4): qty 1

## 2012-07-08 MED ORDER — WARFARIN SODIUM 2.5 MG PO TABS
2.5000 mg | ORAL_TABLET | Freq: Every day | ORAL | Status: DC
  Administered 2012-07-08 – 2012-07-10 (×2): 2.5 mg via ORAL
  Filled 2012-07-08 (×4): qty 1

## 2012-07-08 MED ORDER — ALPRAZOLAM 0.25 MG PO TABS
0.2500 mg | ORAL_TABLET | Freq: Three times a day (TID) | ORAL | Status: DC | PRN
  Administered 2012-07-08 – 2012-07-10 (×3): 0.25 mg via ORAL
  Filled 2012-07-08 (×3): qty 1

## 2012-07-08 MED ORDER — INSULIN DETEMIR 100 UNIT/ML ~~LOC~~ SOLN
24.0000 [IU] | Freq: Two times a day (BID) | SUBCUTANEOUS | Status: DC
  Administered 2012-07-08 – 2012-07-09 (×3): 24 [IU] via SUBCUTANEOUS
  Filled 2012-07-08 (×10): qty 0.24

## 2012-07-08 MED ORDER — AMIODARONE HCL 200 MG PO TABS
200.0000 mg | ORAL_TABLET | Freq: Two times a day (BID) | ORAL | Status: DC
  Administered 2012-07-09 – 2012-07-10 (×3): 200 mg via ORAL
  Filled 2012-07-08 (×7): qty 1

## 2012-07-08 MED ORDER — FUROSEMIDE 10 MG/ML IJ SOLN
20.0000 mg | Freq: Four times a day (QID) | INTRAMUSCULAR | Status: AC
  Administered 2012-07-08 (×3): 20 mg via INTRAVENOUS
  Filled 2012-07-08: qty 2

## 2012-07-08 MED ORDER — WARFARIN - PHYSICIAN DOSING INPATIENT
Freq: Every day | Status: DC
  Administered 2012-07-08 – 2012-07-12 (×3)

## 2012-07-08 MED ORDER — ENOXAPARIN SODIUM 40 MG/0.4ML ~~LOC~~ SOLN
40.0000 mg | SUBCUTANEOUS | Status: DC
  Administered 2012-07-09 – 2012-07-13 (×5): 40 mg via SUBCUTANEOUS
  Filled 2012-07-08 (×7): qty 0.4

## 2012-07-08 MED FILL — Sodium Chloride IV Soln 0.9%: INTRAVENOUS | Qty: 1000 | Status: AC

## 2012-07-08 MED FILL — Mannitol IV Soln 20%: INTRAVENOUS | Qty: 500 | Status: AC

## 2012-07-08 MED FILL — Heparin Sodium (Porcine) Inj 1000 Unit/ML: INTRAMUSCULAR | Qty: 30 | Status: AC

## 2012-07-08 MED FILL — Magnesium Sulfate Inj 50%: INTRAMUSCULAR | Qty: 10 | Status: AC

## 2012-07-08 MED FILL — Potassium Chloride Inj 2 mEq/ML: INTRAVENOUS | Qty: 40 | Status: AC

## 2012-07-08 MED FILL — Electrolyte-R (PH 7.4) Solution: INTRAVENOUS | Qty: 5000 | Status: AC

## 2012-07-08 MED FILL — Sodium Chloride Irrigation Soln 0.9%: Qty: 3000 | Status: AC

## 2012-07-08 MED FILL — Heparin Sodium (Porcine) Inj 1000 Unit/ML: INTRAMUSCULAR | Qty: 10 | Status: AC

## 2012-07-08 MED FILL — Lidocaine HCl IV Inj 20 MG/ML: INTRAVENOUS | Qty: 5 | Status: AC

## 2012-07-08 MED FILL — Sodium Bicarbonate IV Soln 8.4%: INTRAVENOUS | Qty: 50 | Status: AC

## 2012-07-08 MED FILL — Calcium Chloride Inj 10%: INTRAVENOUS | Qty: 10 | Status: AC

## 2012-07-08 NOTE — Progress Notes (Signed)
[facility]  Patient Status: Active (receiving services up to time of hospitalization)  [facility] is providing the following services: RN  If patient discharges after hours, please call [phone].   Jodene Nam 07/08/2012, 9:54 AM

## 2012-07-08 NOTE — Progress Notes (Addendum)
[facility] DAILY PROGRESS NOTE                   [address]            [name] [address]          [phone]      1 Day Post-Op Procedure(s) (LRB): INTRAOPERATIVE TRANSESOPHAGEAL ECHOCARDIOGRAM (N/A) MITRAL VALVE (MV) REPLACEMENT (N/A)  Total Length of Stay:  LOS: 1 day   Subjective: Had a lot of pain overnight, but comfortable this am. +cough, mild nausea.  In good spirits.   Objective: Vital signs in last 24 hours: Temp:  [95.2 F (35.1 C)-100.9 F (38.3 C)] 100 F (37.8 C) (04/04 0700) Pulse Rate:  [76-92] 85 (04/04 0700) Cardiac Rhythm:  [-] Normal sinus rhythm (04/04 0400) Resp:  [9-29] 17 (04/04 0700) BP: (61-118)/(14-67) 112/62 mmHg (04/04 0700) SpO2:  [93 %-100 %] 94 % (04/04 0700) Arterial Line BP: (75-131)/(37-57) 124/55 mmHg (04/04 0700) FiO2 (%):  [39.1 %-60.4 %] 40.1 % (04/04 0015) Weight:  [243 lb 6.2 oz (110.4 kg)] 243 lb 6.2 oz (110.4 kg) (04/04 0500)  Filed Weights   07/06/12 1500 07/08/12 0500  Weight: 216 lb (97.977 kg) 243 lb 6.2 oz (110.4 kg)  PRE-OPERATIVE WEIGHT: 97.9 kg   Weight change: 27 lb 6.2 oz (12.423 kg)   Hemodynamic parameters for last 24 hours: PAP: (25-56)/(11-33) 47/25 mmHg CO:  [6.2 L/min-7.1 L/min] 6.5 L/min CI:  [3.1 L/min/m2-3.5 L/min/m2] 3.2 L/min/m2  Intake/Output from previous day: 04/03 0701 - 04/04 0700 In: 10097.4 [I.V.:6347.4; Blood:750; IV Piggyback:3000] Out: 6950 [Urine:4300; Blood:1800; Chest Tube:850]   CBGs: 196-143-115-124-133-143-126-131-146-104-110-92-113     Current Meds: Scheduled Meds: . acetaminophen  1,000 mg Oral Q6H   Or  . acetaminophen (TYLENOL) oral liquid 160 mg/5 mL  975 mg Per Tube Q6H  . aspirin EC  325 mg Oral Daily   Or  . aspirin  324 mg Per Tube Daily  . bisacodyl  10 mg Oral Daily   Or  . bisacodyl  10 mg Rectal Daily  . cefUROXime (ZINACEF)  IV  1.5 g Intravenous Q12H  . docusate sodium  200 mg Oral Daily  . famotidine (PEPCID) IV  20 mg Intravenous Q12H  .  insulin regular  0-10 Units Intravenous TID WC  . metoprolol tartrate  12.5 mg Oral BID   Or  . metoprolol tartrate  12.5 mg Per Tube BID  . [START ON 07/09/2012] pantoprazole  40 mg Oral Daily  . sodium chloride  3 mL Intravenous Q12H   Continuous Infusions: . sodium chloride 20 mL/hr at 07/07/12 1900  . sodium chloride 10 mL/hr at 07/07/12 1900  . sodium chloride    . dexmedetomidine Stopped (07/08/12 0200)  . DOPamine 3 mcg/kg/min (07/07/12 2200)  . insulin (NOVOLIN-R) infusion 3.8 mL/hr at 07/08/12 0600  . lactated ringers 20 mL/hr at 07/07/12 1900  . milrinone 0.3 mcg/kg/min (07/07/12 2259)  . nitroGLYCERIN Stopped (07/07/12 1630)  . phenylephrine (NEO-SYNEPHRINE) Adult infusion Stopped (07/08/12 0345)   PRN Meds:.metoprolol, midazolam, morphine injection, ondansetron (ZOFRAN) IV, oxyCODONE, potassium chloride, sodium chloride  Physical Exam: General appearance: alert, cooperative and no distress Heart: RRR, + rub at L upper sternal border Lungs: Expiratory wheezes bilateraly Extremities: No significant LE edema Wound: Dressed and dry   Lab Results: CBC: Recent Labs  07/07/12 2230 07/07/12 2234 07/08/12 0500  WBC 12.6*  --  16.7*  HGB 7.3* 6.5* 9.8*  HCT 21.5* 19.0* 28.5*  PLT 68*  --  65*   BMET:  Recent Labs  07/07/12 2234 07/08/12 0500  NA 142 138  K 3.6 4.9  CL 108 107  CO2  --  25  GLUCOSE 129* 102*  BUN 6 8  CREATININE 0.80 0.75  CALCIUM  --  8.1*    PT/INR:  Recent Labs  07/07/12 1600  LABPROT 17.6*  INR 1.49   Radiology: Dg Chest Portable 1 View In Am  07/08/2012  *RADIOLOGY REPORT*  Clinical Data: Post CABG, chest tubes.  PORTABLE CHEST - 1 VIEW  Comparison: 07/07/2012 and CT chest 06/20/2012.  Findings: Endotracheal tube and nasogastric tube have been removed in the interval.  Left IJ Swan-Ganz catheter tip projects over the right pulmonary artery.  Bilateral chest tubes and mediastinal drains are in place.  Cardiomediastinal silhouette is  accentuated by low lung volumes and postoperative state.  Mild diffuse bilateral air space disease with interval improvement in right upper lobe aeration.  No definite pneumothorax. Small amount of subcutaneous emphysema is seen along the lower right chest wall.  IMPRESSION:  1.  Mild diffuse bilateral air space disease may be due to edema, with interval improvement in right upper lobe aeration. 2.  No definite pneumothorax with bilateral chest tubes in place.   Original Report Authenticated By: Leanna Battles, M.D.    Dg Chest Portable 1 View  07/07/2012  *RADIOLOGY REPORT*  Clinical Data: Postop CABG  PORTABLE CHEST - 1 VIEW  Comparison: 07/04/2012  Findings: Endotracheal tube in good position.  Postop   mitral valve replacement.  Swan-Ganz catheter tip in the right lower pulmonary artery.  Bilateral chest tubes in place without pneumothorax.  There is  right sided airspace disease which is asymmetric.  This could be edema or pneumonia or pulmonary hemorrhage.  There is mild atelectasis in the lung bases.  IMPRESSION: Asymmetric airspace disease on the right.  Negative for pneumothorax.   Original Report Authenticated By: Janeece Riggers, M.D.      Assessment/Plan: S/P Procedure(s) (LRB): INTRAOPERATIVE TRANSESOPHAGEAL ECHOCARDIOGRAM (N/A) MITRAL VALVE (MV) REPLACEMENT (N/A)  CV- maintaining SR this am.  SBPs low.  Wean pressors as tolerated.   Pulm- start pulm toilet measures, consider Xopenex nebs if wheezing persists.  Vol overload- diurese.  UOP excellent overnight.  Mobilize, routine POD#1 progression.      COLLINS,GINA H 07/08/2012 7:55 AM   I have seen and examined the patient and agree with the assessment and plan as outlined.  Doing very well POD1.  Mobilize.  Wean drips.  Diuresis.  Start coumadin for mechanical valve.  Restart oral amiodarone for afib prophylaxis.  OWEN,CLARENCE H 07/08/2012 8:42 AM

## 2012-07-08 NOTE — Procedures (Signed)
Extubation Procedure Note  Patient Details:   Name: Priscilla Stevens DOB: Sep 20, 1950 MRN: [id number]   Airway Documentation:     Evaluation  O2 sats: stable throughout Complications: No apparent complications Patient did tolerate procedure well. Bilateral Breath Sounds: Clear;Diminished Suctioning: Airway Yes PT was extubated to 5L [place]. PT was able to tell us her name. PT had a NIF of (-39) and a VC of (7L)  [name], Duane Lope 07/08/2012, 12:27 AM

## 2012-07-09 ENCOUNTER — Inpatient Hospital Stay (HOSPITAL_COMMUNITY): Payer: Medicare Other

## 2012-07-09 LAB — BASIC METABOLIC PANEL
CO2: 27 mEq/L (ref 19–32)
Calcium: 8.3 mg/dL — ABNORMAL LOW (ref 8.4–10.5)
Creatinine, Ser: 0.82 mg/dL (ref 0.50–1.10)

## 2012-07-09 LAB — CBC
MCH: 28.1 pg (ref 26.0–34.0)
MCV: 84 fL (ref 78.0–100.0)
Platelets: 63 10*3/uL — ABNORMAL LOW (ref 150–400)
RDW: 15.8 % — ABNORMAL HIGH (ref 11.5–15.5)
WBC: 16.7 10*3/uL — ABNORMAL HIGH (ref 4.0–10.5)

## 2012-07-09 LAB — TYPE AND SCREEN
Antibody Screen: NEGATIVE
Unit division: 0

## 2012-07-09 LAB — GLUCOSE, CAPILLARY
Glucose-Capillary: 108 mg/dL — ABNORMAL HIGH (ref 70–99)
Glucose-Capillary: 110 mg/dL — ABNORMAL HIGH (ref 70–99)

## 2012-07-09 MED ORDER — BUDESONIDE 0.5 MG/2ML IN SUSP
0.5000 mg | Freq: Two times a day (BID) | RESPIRATORY_TRACT | Status: DC
  Administered 2012-07-09 – 2012-07-13 (×8): 0.5 mg via RESPIRATORY_TRACT
  Filled 2012-07-09 (×13): qty 2

## 2012-07-09 MED ORDER — BUDESONIDE-FORMOTEROL FUMARATE 160-4.5 MCG/ACT IN AERO
2.0000 | INHALATION_SPRAY | Freq: Two times a day (BID) | RESPIRATORY_TRACT | Status: DC
  Administered 2012-07-09: 2 via RESPIRATORY_TRACT
  Filled 2012-07-09 (×2): qty 6

## 2012-07-09 MED ORDER — FUROSEMIDE 10 MG/ML IJ SOLN
40.0000 mg | Freq: Two times a day (BID) | INTRAMUSCULAR | Status: AC
  Administered 2012-07-09 – 2012-07-11 (×6): 40 mg via INTRAVENOUS
  Filled 2012-07-09 (×6): qty 4

## 2012-07-09 MED ORDER — DM-GUAIFENESIN ER 30-600 MG PO TB12
1.0000 | ORAL_TABLET | Freq: Two times a day (BID) | ORAL | Status: DC | PRN
  Administered 2012-07-10: 1 via ORAL
  Filled 2012-07-09: qty 1

## 2012-07-09 MED ORDER — LEVALBUTEROL HCL 1.25 MG/0.5ML IN NEBU
1.2500 mg | INHALATION_SOLUTION | Freq: Three times a day (TID) | RESPIRATORY_TRACT | Status: DC
  Administered 2012-07-09 – 2012-07-10 (×6): 1.25 mg via RESPIRATORY_TRACT
  Filled 2012-07-09 (×11): qty 0.5

## 2012-07-09 MED ORDER — TRAMADOL HCL 50 MG PO TABS
50.0000 mg | ORAL_TABLET | Freq: Four times a day (QID) | ORAL | Status: DC | PRN
  Administered 2012-07-09 – 2012-07-13 (×4): 50 mg via ORAL
  Filled 2012-07-09 (×4): qty 1

## 2012-07-09 NOTE — Progress Notes (Signed)
2 Days Post-Op Procedure(s) (LRB): INTRAOPERATIVE TRANSESOPHAGEAL ECHOCARDIOGRAM (N/A) MITRAL VALVE (MV) REPLACEMENT (N/A) Subjective: Slept in chair Sl confused, no motor deficit Air leak from tube #2  Objective: Vital signs in last 24 hours: Temp:  [98.4 F (36.9 C)-100.8 F (38.2 C)] 98.6 F (37 C) (04/05 0742) Pulse Rate:  [67-84] 82 (04/05 0700) Cardiac Rhythm:  [-] Normal sinus rhythm (04/05 0700) Resp:  [11-25] 12 (04/05 0700) BP: (100-122)/(18-95) 103/82 mmHg (04/05 0700) SpO2:  [96 %-100 %] 99 % (04/05 0700) Arterial Line BP: (133-140)/(60-64) 138/61 mmHg (04/04 1100) Weight:  [239 lb 12.8 oz (108.773 kg)] 239 lb 12.8 oz (108.773 kg) (04/05 0500)  Hemodynamic parameters for last 24 hours: PAP: (43-51)/(19-32) 43/19 mmHg CO:  [6.5 L/min-7.5 L/min] 6.5 L/min CI:  [3.2 L/min/m2-3.7 L/min/m2] 3.2 L/min/m2  Intake/Output from previous day: 04/04 0701 - 04/05 0700 In: 1610.9 [P.O.:800; I.V.:610.9; IV Piggyback:200] Out: 3450 [Urine:2250; Chest Tube:1200] Intake/Output this shift:    bilat coarse bs  Lab Results:  Recent Labs  07/08/12 1700 07/08/12 1741 07/09/12 0420  WBC 19.5*  --  16.7*  HGB 10.2* 10.5* 9.8*  HCT 29.7* 31.0* 29.3*  PLT 67*  --  63*   BMET:  Recent Labs  07/08/12 0500  07/08/12 1741 07/09/12 0420  NA 138  --  139 139  K 4.9  --  4.6 4.3  CL 107  --  103 105  CO2 25  --   --  27  GLUCOSE 102*  --  114* 122*  BUN 8  --  9 9  CREATININE 0.75  < > 0.90 0.82  CALCIUM 8.1*  --   --  8.3*  < > = values in this interval not displayed.  PT/INR:  Recent Labs  07/09/12 0420  LABPROT 16.2*  INR 1.33   ABG    Component Value Date/Time   PHART 7.372 07/08/2012 0124   HCO3 24.6* 07/08/2012 0124   TCO2 26 07/08/2012 1741   ACIDBASEDEF 1.0 07/08/2012 0124   O2SAT 96.0 07/08/2012 0124   CBG (last 3)   Recent Labs  07/08/12 2329 07/09/12 0358 07/09/12 0739  GLUCAP 112* 108* 118*    Assessment/Plan: S/P Procedure(s)  (LRB): INTRAOPERATIVE TRANSESOPHAGEAL ECHOCARDIOGRAM (N/A) MITRAL VALVE (MV) REPLACEMENT (N/A) Plan  Diuresis, nebs Follow low plts   LOS: 2 days    VAN TRIGT III,Cathren Sween 07/09/2012

## 2012-07-09 NOTE — Progress Notes (Signed)
T. CTS p.m. Rounds  Patient examined and record reviewed.Hemodynamics stable,labs satisfactory.Patient had stable day.Continue current care.  Airleak from chest tube is improved this evening. Patient resting comfortable in chair. VAN TRIGT III,PETER 07/09/2012

## 2012-07-10 ENCOUNTER — Inpatient Hospital Stay (HOSPITAL_COMMUNITY): Payer: Medicare Other

## 2012-07-10 LAB — BASIC METABOLIC PANEL
BUN: 7 mg/dL (ref 6–23)
CO2: 26 mEq/L (ref 19–32)
Calcium: 8.4 mg/dL (ref 8.4–10.5)
Chloride: 103 mEq/L (ref 96–112)
Creatinine, Ser: 0.64 mg/dL (ref 0.50–1.10)
GFR calc Af Amer: >90 mL/min (ref >90)
GFR calc non Af Amer: >90 mL/min (ref >90)
Glucose, Bld: 98 mg/dL (ref 70–99)
Potassium: 3.3 mEq/L — ABNORMAL LOW (ref 3.5–5.1)
Sodium: 138 mEq/L (ref 135–145)

## 2012-07-10 LAB — CBC
HCT: 27.6 % — ABNORMAL LOW (ref 36.0–46.0)
Hemoglobin: 9.2 g/dL — ABNORMAL LOW (ref 12.0–15.0)
MCH: 27.6 pg (ref 26.0–34.0)
MCHC: 33.3 g/dL (ref 30.0–36.0)
MCV: 82.9 fL (ref 78.0–100.0)
Platelets: 78 10*3/uL — ABNORMAL LOW (ref 150–400)
RBC: 3.33 MIL/uL — ABNORMAL LOW (ref 3.87–5.11)
RDW: 15.5 % (ref 11.5–15.5)
WBC: 11.7 10*3/uL — ABNORMAL HIGH (ref 4.0–10.5)

## 2012-07-10 LAB — GLUCOSE, CAPILLARY
Glucose-Capillary: 104 mg/dL — ABNORMAL HIGH (ref 70–99)
Glucose-Capillary: 108 mg/dL — ABNORMAL HIGH (ref 70–99)
Glucose-Capillary: 117 mg/dL — ABNORMAL HIGH (ref 70–99)
Glucose-Capillary: 56 mg/dL — ABNORMAL LOW (ref 70–99)
Glucose-Capillary: 87 mg/dL (ref 70–99)
Glucose-Capillary: 88 mg/dL (ref 70–99)
Glucose-Capillary: 99 mg/dL (ref 70–99)

## 2012-07-10 MED ORDER — ARIPIPRAZOLE 15 MG PO TABS
7.5000 mg | ORAL_TABLET | Freq: Every day | ORAL | Status: DC
  Administered 2012-07-10: 7.5 mg via ORAL
  Filled 2012-07-10 (×2): qty 1

## 2012-07-10 MED ORDER — VENLAFAXINE HCL ER 75 MG PO CP24
75.0000 mg | ORAL_CAPSULE | Freq: Every day | ORAL | Status: DC
  Administered 2012-07-10: 75 mg via ORAL
  Filled 2012-07-10 (×2): qty 1

## 2012-07-10 MED ORDER — ACETAMINOPHEN 325 MG PO TABS: 650.0000 mg | ORAL_TABLET | Freq: Four times a day (QID) | ORAL | Status: DC | PRN

## 2012-07-10 MED ORDER — POTASSIUM CHLORIDE CRYS ER 20 MEQ PO TBCR
20.0000 meq | EXTENDED_RELEASE_TABLET | Freq: Every day | ORAL | Status: DC
  Administered 2012-07-10 – 2012-07-11 (×2): 20 meq via ORAL
  Filled 2012-07-10 (×3): qty 1

## 2012-07-10 NOTE — Progress Notes (Signed)
3 Days Post-Op Procedure(s) (LRB): INTRAOPERATIVE TRANSESOPHAGEAL ECHOCARDIOGRAM (N/A) MITRAL VALVE (MV) REPLACEMENT (N/A) Sub  Mechanical MVR, Maze Fluid overload, improving w/ BID lasix Some confusion- narcotics stopped and antidepressants resumed NSR, afebrile Low platelets improved Objective: Vital signs in last 24 hours: Temp:  [97.7 F (36.5 C)-98.5 F (36.9 C)] 98 F (36.7 C) (04/06 1135) Pulse Rate:  [85-104] 94 (04/06 1400) Cardiac Rhythm:  [-] Normal sinus rhythm (04/06 0800) Resp:  [12-28] 21 (04/06 1400) BP: (84-120)/(33-85) 107/70 mmHg (04/06 1400) SpO2:  [92 %-100 %] 97 % (04/06 1400)  Hemodynamic parameters for last 24 hours:    Intake/Output from previous day: 04/05 0701 - 04/06 0700 In: 1300 [P.O.:820; I.V.:480] Out: 3930 [Urine:3530; Chest Tube:400] Intake/Output this shift: Total I/O In: 360 [P.O.:240; I.V.:20; IV Piggyback:100] Out: 1100 [Urine:1100]  No murmur No focal weakness CT airleak improved- CT drainage > 300cc- leave remaining tubes  Lab Results:  Recent Labs  07/09/12 0420 07/10/12 0845  WBC 16.7* 11.7*  HGB 9.8* 9.2*  HCT 29.3* 27.6*  PLT 63* 78*   BMET:  Recent Labs  07/09/12 0420 07/10/12 0845  NA 139 138  K 4.3 3.3*  CL 105 103  CO2 27 26  GLUCOSE 122* 98  BUN 9 7  CREATININE 0.82 0.64  CALCIUM 8.3* 8.4    PT/INR:  Recent Labs  07/10/12 0845  LABPROT 14.2  INR 1.11   ABG    Component Value Date/Time   PHART 7.372 07/08/2012 0124   HCO3 24.6* 07/08/2012 0124   TCO2 26 07/08/2012 1741   ACIDBASEDEF 1.0 07/08/2012 0124   O2SAT 96.0 07/08/2012 0124   CBG (last 3)   Recent Labs  07/10/12 0027 07/10/12 0718 07/10/12 1132  GLUCAP 85 88 79    Assessment/Plan: S/P Procedure(s) (LRB): INTRAOPERATIVE TRANSESOPHAGEAL ECHOCARDIOGRAM (N/A) MITRAL VALVE (MV) REPLACEMENT (N/A) Plan- keep in ICU   LOS: 3 days    VAN TRIGT III,Livian Vanderbeck 07/10/2012

## 2012-07-10 NOTE — Evaluation (Signed)
Physical Therapy Evaluation Patient Details Name: SAGE HAMMILL MRN: [id number] DOB: [birth year] Today's Date: 07/10/2012 Time: 9629-5284 PT Time Calculation (min): 32 min  PT Assessment / Plan / Recommendation Clinical Impression  Pt s/p MVR with decr mobility secondary to decr endurance, decr balance and multiple lines/tubes.  Will benefit from PT to progress to prior functional level.  Will need HHPT f/u.  May need RW.      PT Assessment  Patient needs continued PT services    Follow Up Recommendations  Home health PT;Supervision/Assistance - 24 hour                Equipment Recommendations  Rolling walker with 5" wheels         Frequency Min 3X/week    Precautions / Restrictions Precautions Precautions: Fall;Sternal Restrictions Weight Bearing Restrictions: No   Pertinent Vitals/Pain VSS, some pain      Mobility  Bed Mobility Bed Mobility: Sit to Supine Sit to Supine: 1: +2 Total assist;HOB flat Sit to Supine: Patient Percentage: 40% Details for Bed Mobility Assistance: Assist for lowering trunk into bed for sternal precautions and assisting feet onto bed. Transfers Transfers: Sit to Stand;Stand to Sit Sit to Stand: 1: +2 Total assist;Without upper extremity assist;From chair/3-in-1 Sit to Stand: Patient Percentage: 70% Stand to Sit: 1: +2 Total assist;Without upper extremity assist;To bed Stand to Sit: Patient Percentage: 70% Details for Transfer Assistance: Pt needed cues for hand placement for sternal precautions. Ambulation/Gait Ambulation/Gait Assistance: 4: Min assist Ambulation Distance (Feet): 275 Feet Assistive device: Other (Comment) (pushing wheelchair) Ambulation/Gait Assistance Details: Pt ambulated pushing wheelchair with decr speed needing cues and assist at times to maneuver wheelchair.  Somewhat flat affect.   Gait Pattern: Step-to pattern;Decreased step length - left;Decreased step length - right;Decreased stride length;Shuffle;Wide base  of support Gait velocity: decreased Stairs: No Wheelchair Mobility Wheelchair Mobility: No         PT Diagnosis: Generalized weakness  PT Problem List: Decreased activity tolerance;Decreased balance;Decreased mobility;Decreased knowledge of use of DME;Decreased safety awareness;Decreased knowledge of precautions PT Treatment Interventions: DME instruction;Gait training;Functional mobility training;Therapeutic activities;Therapeutic exercise;Balance training;Patient/family education;Stair training   PT Goals Acute Rehab PT Goals PT Goal Formulation: With patient Time For Goal Achievement: 07/24/12 Potential to Achieve Goals: Good Pt will go Supine/Side to Sit: Independently PT Goal: Supine/Side to Sit - Progress: Goal set today Pt will go Sit to Stand: Independently;without upper extremity assist PT Goal: Sit to Stand - Progress: Goal set today Pt will Ambulate: >150 feet;with modified independence;with least restrictive assistive device PT Goal: Ambulate - Progress: Goal set today Pt will Go Up / Down Stairs: 6-9 stairs;with supervision;with rail(s) PT Goal: Up/Down Stairs - Progress: Goal set today Additional Goals Additional Goal #1: Pt to follow sternal precautions with all mobility without cuing.   PT Goal: Additional Goal #1 - Progress: Goal set today  Visit Information  Last PT Received On: 07/10/12 Assistance Needed: +2    Subjective Data  Subjective: "I want to try and walk." Patient Stated Goal: To go home   Prior Functioning  Home Living Lives With: Spouse Available Help at Discharge: Family;Available 24 hours/day Type of Home: House Home Access: Stairs to enter [employer] of Steps: 8 Entrance Stairs-Rails: Can reach both Home Layout: One level Bathroom Shower/Tub: Health visitor: Handicapped height Home Adaptive Equipment: None Prior Function Level of Independence: Independent Able to Take Stairs?: Yes Driving: Yes Vocation:  Retired Musician: No difficulties    Copywriter, advertising  Overall Cognitive Status: Appears within functional limits for tasks assessed/performed Arousal/Alertness: Awake/alert Orientation Level: Appears intact for tasks assessed Behavior During Session: [facility] for tasks performed    Extremity/Trunk Assessment Right Lower Extremity Assessment RLE ROM/Strength/Tone: [facility] for tasks assessed Left Lower Extremity Assessment LLE ROM/Strength/Tone: [facility] for tasks assessed Trunk Assessment Trunk Assessment: Normal   Balance Static Standing Balance Static Standing - Balance Support: Bilateral upper extremity supported;During functional activity Static Standing - Level of Assistance: 5: Stand by assistance Static Standing - Comment/# of Minutes: can stand statically supporting UEs on wheelchair with stand by assist.  End of Session PT - End of Session Equipment Utilized During Treatment: Gait belt Activity Tolerance: Patient limited by fatigue Patient left: in bed;with call bell/phone within reach Nurse Communication: Mobility status       INGOLD,Guilianna Mckoy 07/10/2012, 3:37 PM  [facility] [phone] (pager)

## 2012-07-11 ENCOUNTER — Encounter (HOSPITAL_COMMUNITY): Payer: Self-pay | Admitting: Thoracic Surgery (Cardiothoracic Vascular Surgery)

## 2012-07-11 ENCOUNTER — Inpatient Hospital Stay (HOSPITAL_COMMUNITY): Payer: Medicare Other

## 2012-07-11 LAB — GLUCOSE, CAPILLARY
Glucose-Capillary: 98 mg/dL (ref 70–99)
Glucose-Capillary: 92 mg/dL (ref 70–99)
Glucose-Capillary: 112 mg/dL — ABNORMAL HIGH (ref 70–99)

## 2012-07-11 LAB — BASIC METABOLIC PANEL
BUN: 9 mg/dL (ref 6–23)
CO2: 26 mEq/L (ref 19–32)
Calcium: 8.9 mg/dL (ref 8.4–10.5)
Chloride: 102 mEq/L (ref 96–112)
Creatinine, Ser: 0.7 mg/dL (ref 0.50–1.10)
GFR calc Af Amer: >90 mL/min (ref >90)
GFR calc non Af Amer: >90 mL/min (ref >90)
Glucose, Bld: 102 mg/dL — ABNORMAL HIGH (ref 70–99)
Potassium: 3.7 mEq/L (ref 3.5–5.1)
Sodium: 139 mEq/L (ref 135–145)

## 2012-07-11 LAB — CBC
HCT: 30.6 % — ABNORMAL LOW (ref 36.0–46.0)
Hemoglobin: 10.3 g/dL — ABNORMAL LOW (ref 12.0–15.0)
MCH: 27.8 pg (ref 26.0–34.0)
MCHC: 33.7 g/dL (ref 30.0–36.0)
MCV: 82.7 fL (ref 78.0–100.0)
Platelets: 125 10*3/uL — ABNORMAL LOW (ref 150–400)
RBC: 3.7 MIL/uL — ABNORMAL LOW (ref 3.87–5.11)
RDW: 15.9 % — ABNORMAL HIGH (ref 11.5–15.5)
WBC: 10.9 10*3/uL — ABNORMAL HIGH (ref 4.0–10.5)

## 2012-07-11 LAB — PROTIME-INR
INR: 1.14 (ref 0.00–1.49)
Prothrombin Time: 14.4 seconds (ref 11.6–15.2)

## 2012-07-11 MED ORDER — VENLAFAXINE HCL ER 150 MG PO CP24
150.0000 mg | ORAL_CAPSULE | Freq: Every day | ORAL | Status: DC
  Administered 2012-07-11 – 2012-07-12 (×2): 150 mg via ORAL
  Filled 2012-07-11 (×3): qty 1

## 2012-07-11 MED ORDER — AMIODARONE HCL 200 MG PO TABS
200.0000 mg | ORAL_TABLET | Freq: Two times a day (BID) | ORAL | Status: DC
  Administered 2012-07-11 – 2012-07-12 (×4): 200 mg via ORAL
  Filled 2012-07-11 (×7): qty 1

## 2012-07-11 MED ORDER — ALPRAZOLAM 0.25 MG PO TABS: 0.2500 mg | ORAL_TABLET | Freq: Every evening | ORAL | Status: DC | PRN

## 2012-07-11 MED ORDER — PNEUMOCOCCAL VAC POLYVALENT 25 MCG/0.5ML IJ INJ
0.5000 mL | INJECTION | INTRAMUSCULAR | Status: AC
  Filled 2012-07-11: qty 0.5

## 2012-07-11 MED ORDER — SODIUM CHLORIDE 0.9 % IV SOLN: 250.0000 mL | INTRAVENOUS | Status: DC | PRN

## 2012-07-11 MED ORDER — ATORVASTATIN CALCIUM 10 MG PO TABS
10.0000 mg | ORAL_TABLET | Freq: Every day | ORAL | Status: DC
  Administered 2012-07-11 – 2012-07-12 (×2): 10 mg via ORAL
  Filled 2012-07-11 (×3): qty 1

## 2012-07-11 MED ORDER — WARFARIN SODIUM 5 MG PO TABS
5.0000 mg | ORAL_TABLET | Freq: Every day | ORAL | Status: DC
  Administered 2012-07-11: 5 mg via ORAL
  Filled 2012-07-11 (×2): qty 1

## 2012-07-11 MED ORDER — POTASSIUM CHLORIDE CRYS ER 20 MEQ PO TBCR
20.0000 meq | EXTENDED_RELEASE_TABLET | Freq: Every day | ORAL | Status: DC
  Filled 2012-07-11: qty 1

## 2012-07-11 MED ORDER — ARIPIPRAZOLE 15 MG PO TABS
7.5000 mg | ORAL_TABLET | Freq: Every day | ORAL | Status: DC
  Administered 2012-07-11 – 2012-07-12 (×2): 7.5 mg via ORAL
  Filled 2012-07-11 (×3): qty 1

## 2012-07-11 MED ORDER — METOPROLOL SUCCINATE ER 25 MG PO TB24
25.0000 mg | ORAL_TABLET | Freq: Two times a day (BID) | ORAL | Status: DC
  Administered 2012-07-11 – 2012-07-12 (×2): 25 mg via ORAL
  Filled 2012-07-11 (×6): qty 1

## 2012-07-11 MED ORDER — LEVALBUTEROL HCL 1.25 MG/0.5ML IN NEBU
1.2500 mg | INHALATION_SOLUTION | Freq: Four times a day (QID) | RESPIRATORY_TRACT | Status: DC | PRN
  Filled 2012-07-11: qty 0.5

## 2012-07-11 MED ORDER — FUROSEMIDE 40 MG PO TABS
40.0000 mg | ORAL_TABLET | Freq: Two times a day (BID) | ORAL | Status: DC
  Administered 2012-07-12 – 2012-07-13 (×3): 40 mg via ORAL
  Filled 2012-07-11 (×5): qty 1

## 2012-07-11 MED ORDER — TRAZODONE HCL 100 MG PO TABS
100.0000 mg | ORAL_TABLET | Freq: Every day | ORAL | Status: DC
  Administered 2012-07-11 – 2012-07-12 (×2): 100 mg via ORAL
  Filled 2012-07-11 (×3): qty 1

## 2012-07-11 MED ORDER — MOVING RIGHT ALONG BOOK
Freq: Once | Status: AC
  Administered 2012-07-11: 08:00:00
  Filled 2012-07-11: qty 1

## 2012-07-11 MED ORDER — SODIUM CHLORIDE 0.9 % IJ SOLN: 3.0000 mL | INTRAMUSCULAR | Status: DC | PRN

## 2012-07-11 MED ORDER — SODIUM CHLORIDE 0.9 % IJ SOLN
3.0000 mL | Freq: Two times a day (BID) | INTRAMUSCULAR | Status: DC
  Administered 2012-07-11: 3 mL via INTRAVENOUS
  Administered 2012-07-11: 11:00:00 via INTRAVENOUS
  Administered 2012-07-12 (×2): 3 mL via INTRAVENOUS

## 2012-07-11 NOTE — Progress Notes (Signed)
CARDIAC REHAB PHASE I   PRE:  Rate/Rhythm: 99 SR    BP: sitting 80/60, 84/60     SaO2: 94 RA  MODE:  Ambulation: 340 ft   POST:  Rate/Rhythm: 106 ST    BP: sitting 110/70     SaO2: 97 RA  Tolerated very well. BP low but asymptomatic. Trouble steering RW. No c/o entire walk, feels good. [id number]   Elissa Lovett [place] CES, ACSM 07/11/2012 1:33 PM

## 2012-07-11 NOTE — Progress Notes (Addendum)
TCTS DAILY PROGRESS NOTE                   [address]            [place] [address]          [phone]      4 Days Post-Op Procedure(s) (LRB): INTRAOPERATIVE TRANSESOPHAGEAL ECHOCARDIOGRAM (N/A) MITRAL VALVE (MV) REPLACEMENT (N/A)  Total Length of Stay:  LOS: 4 days   Subjective: Up in chair.  No complaints.  Breathing stable, no pain.   Objective: Vital signs in last 24 hours: Temp:  [97.8 F (36.6 C)-98.2 F (36.8 C)] 98 F (36.7 C) (04/07 0400) Pulse Rate:  [85-104] 92 (04/07 0700) Cardiac Rhythm:  [-] Normal sinus rhythm (04/07 0200) Resp:  [15-31] 15 (04/07 0700) BP: (72-120)/(41-85) 87/41 mmHg (04/07 0700) SpO2:  [94 %-100 %] 98 % (04/07 0700) Weight:  [227 lb 8 oz (103.193 kg)] 227 lb 8 oz (103.193 kg) (04/07 0500)  Filed Weights   07/08/12 0500 07/09/12 0500 07/11/12 0500  Weight: 243 lb 6.2 oz (110.4 kg) 239 lb 12.8 oz (108.773 kg) 227 lb 8 oz (103.193 kg)  PRE-OPERATIVE WEIGHT: 97.9 kg   Weight change:    Hemodynamic parameters for last 24 hours:    Intake/Output from previous day: 04/06 0701 - 04/07 0700 In: 1110 [P.O.:940; I.V.:20; IV Piggyback:150] Out: 3745 [Urine:2725; Chest Tube:1020]  CBGs: 124-103-99-92    Current Meds: Scheduled Meds: . acetaminophen  1,000 mg Oral Q6H  . amiodarone  200 mg Oral BID WC  . ARIPiprazole  7.5 mg Oral Daily  . aspirin EC  325 mg Oral Daily  . bisacodyl  10 mg Oral Daily   Or  . bisacodyl  10 mg Rectal Daily  . budesonide (PULMICORT) nebulizer solution  0.5 mg Nebulization BID  . docusate sodium  200 mg Oral Daily  . enoxaparin (LOVENOX) injection  40 mg Subcutaneous Q24H  . furosemide  40 mg Intravenous BID  . insulin aspart  0-24 Units Subcutaneous Q4H  . levalbuterol  1.25 mg Nebulization TID  . metoprolol tartrate  12.5 mg Oral BID  . pantoprazole  40 mg Oral Daily  . potassium chloride  20 mEq Oral Daily  . venlafaxine XR  75 mg Oral Daily  . warfarin  2.5 mg Oral q1800  .  Warfarin - Physician Dosing Inpatient   Does not apply q1800   Continuous Infusions: . sodium chloride     PRN Meds:.acetaminophen, ALPRAZolam, dextromethorphan-guaiFENesin, metoprolol, ondansetron (ZOFRAN) IV, sodium chloride, traMADol  Physical Exam: General appearance: alert, cooperative and no distress Heart: regular rate and rhythm Lungs: diminished breath sounds bibasilar Extremities: edema Mild LE Wound: Clean and dry  Lab Results: CBC: Recent Labs  07/09/12 0420 07/10/12 0845  WBC 16.7* 11.7*  HGB 9.8* 9.2*  HCT 29.3* 27.6*  PLT 63* 78*   BMET:  Recent Labs  07/09/12 0420 07/10/12 0845  NA 139 138  K 4.3 3.3*  CL 105 103  CO2 27 26  GLUCOSE 122* 98  BUN 9 7  CREATININE 0.82 0.64  CALCIUM 8.3* 8.4    PT/INR:  Recent Labs  07/10/12 0845  LABPROT 14.2  INR 1.11   Radiology: Dg Chest Port 1 View  07/10/2012  *RADIOLOGY REPORT*  Clinical Data: Status post mitral valve replacement surgery.  PORTABLE CHEST - 1 VIEW  Comparison: 07/09/2012  Findings: Chest tubes have been removed.  No pneumothorax identified.  Heart size and mediastinal contours are  stable. Slightly improved aeration of both lungs with no evidence of significant edema or pulmonary consolidation.  IMPRESSION: No pneumothorax after chest tube removal.  Improved aeration of both lungs.   Original Report Authenticated By: Irish Lack, M.D.      Assessment/Plan: S/P Procedure(s) (LRB): INTRAOPERATIVE TRANSESOPHAGEAL ECHOCARDIOGRAM (N/A) MITRAL VALVE (MV) REPLACEMENT (N/A) CV- SR, BPs still on the low side.  Continue current meds. Coumadin for mechanical valve. Vol overload- continue diuresis. Hypokalemia- replace K+. Hopefully can d/c remaining CT and tx 2000.     COLLINS,GINA H 07/11/2012 7:41 AM   I have seen and examined the patient and agree with the assessment and plan as outlined.  OWEN,CLARENCE H 07/11/2012 7:57 AM

## 2012-07-11 NOTE — Plan of Care (Signed)
Problem: Phase III Progression Outcomes Goal: Time patient transferred to PCTU/Telemetry POD Outcome: Completed/Met Date Met:  07/11/12 1145 Transferred to 2001 via wheelchair.  Portable monitor on.  No changes.

## 2012-07-11 NOTE — Progress Notes (Signed)
Physical Therapy Treatment Patient Details Name: Priscilla Stevens MRN: [id number] DOB: [birth year] Today's Date: 07/11/2012 Time: 0865-7846 PT Time Calculation (min): 24 min  PT Assessment / Plan / Recommendation Comments on Treatment Session  Pt s/p MVR with decr mobility secondary to decr endurance and decr balance.  Spoke to pt about possible need for RW initially upon d/c home and instructed her as to the benefit of having a RW for stability given that she is guarded with ambulation and slighly unsteady at present.  Sister present and will assist at home.  Educated pt and sister as to exercises and sternal precautions and gave handouts.      Follow Up Recommendations  Home health PT;Supervision/Assistance - 24 hour                 Equipment Recommendations  Rolling walker with 5" wheels        Frequency Min 3X/week   Plan Discharge plan remains appropriate;Frequency remains appropriate    Precautions / Restrictions Precautions Precautions: Fall;Sternal Precaution Comments: Gave pt sternal precaution handout as well as handout re: exercises with sternal precautions with verbal instructions. Restrictions Weight Bearing Restrictions: No   Pertinent Vitals/Pain VSS, No pain    Mobility  Bed Mobility Bed Mobility: Supine to Sit Supine to Sit: 4: Min guard;HOB elevated Sit to Supine: Not Tested (comment) Details for Bed Mobility Assistance: cues needed to follow sternal precautions as pt wanting to use UEs.  Transfers Transfers: Stand to Sit;Sit to Stand Sit to Stand: 4: Min guard;Without upper extremity assist;From bed Stand to Sit: 4: Min guard;Without upper extremity assist;To bed Details for Transfer Assistance: Pt needed cues for hand placement for sternal precautions. Ambulation/Gait Ambulation/Gait Assistance: 4: Min guard Ambulation Distance (Feet): 300 Feet Assistive device: None Ambulation/Gait Assistance Details: Pt did not use device but gait very guarded.   Pt did not lose balance however no challenges given to balance either.   Gait Pattern: Step-through pattern;Decreased step length - left;Decreased step length - right;Decreased stride length;Wide base of support Gait velocity: decreased Stairs: No Wheelchair Mobility Wheelchair Mobility: No     PT Goals Acute Rehab PT Goals PT Goal: Supine/Side to Sit - Progress: Progressing toward goal PT Goal: Sit to Stand - Progress: Progressing toward goal PT Goal: Ambulate - Progress: Progressing toward goal Additional Goals PT Goal: Additional Goal #1 - Progress: Progressing toward goal  Visit Information  Last PT Received On: 07/11/12 Assistance Needed: +1    Subjective Data  Subjective: "I want to do it."   Cognition  Cognition Overall Cognitive Status: Appears within functional limits for tasks assessed/performed Arousal/Alertness: Awake/alert Orientation Level: Appears intact for tasks assessed Behavior During Session: [facility] for tasks performed    Balance  Static Standing Balance Static Standing - Balance Support: During functional activity;No upper extremity supported Static Standing - Level of Assistance: 5: Stand by assistance Static Standing - Comment/# of Minutes: can stand staticially without UE support  End of Session PT - End of Session Equipment Utilized During Treatment: Gait belt Activity Tolerance: Patient limited by fatigue Patient left: in bed;with call bell/phone within reach;with family/visitor present Nurse Communication: Mobility status        INGOLD,Alanee Ting 07/11/2012, 4:13 PM  [facility] Acute Rehabilitation [phone] (pager)

## 2012-07-12 ENCOUNTER — Inpatient Hospital Stay (HOSPITAL_COMMUNITY): Payer: Medicare Other

## 2012-07-12 LAB — CBC
HCT: 31.8 % — ABNORMAL LOW (ref 36.0–46.0)
MCH: 27.6 pg (ref 26.0–34.0)
MCV: 84.4 fL (ref 78.0–100.0)
Platelets: 164 10*3/uL (ref 150–400)
RBC: 3.77 MIL/uL — ABNORMAL LOW (ref 3.87–5.11)
RDW: 15.8 % — ABNORMAL HIGH (ref 11.5–15.5)

## 2012-07-12 LAB — BASIC METABOLIC PANEL
BUN: 11 mg/dL (ref 6–23)
CO2: 27 mEq/L (ref 19–32)
Calcium: 9 mg/dL (ref 8.4–10.5)
Creatinine, Ser: 0.79 mg/dL (ref 0.50–1.10)

## 2012-07-12 LAB — GLUCOSE, CAPILLARY: Glucose-Capillary: 119 mg/dL — ABNORMAL HIGH (ref 70–99)

## 2012-07-12 MED ORDER — WARFARIN SODIUM 7.5 MG PO TABS
7.5000 mg | ORAL_TABLET | Freq: Once | ORAL | Status: AC
  Administered 2012-07-12: 7.5 mg via ORAL
  Filled 2012-07-12: qty 1

## 2012-07-12 MED ORDER — PATIENT'S GUIDE TO USING COUMADIN BOOK
Freq: Once | Status: AC
  Administered 2012-07-12: 18:00:00
  Filled 2012-07-12: qty 1

## 2012-07-12 MED ORDER — WARFARIN VIDEO: Freq: Once | Status: DC

## 2012-07-12 MED ORDER — POTASSIUM CHLORIDE CRYS ER 20 MEQ PO TBCR
40.0000 meq | EXTENDED_RELEASE_TABLET | Freq: Every day | ORAL | Status: DC
  Administered 2012-07-12: 40 meq via ORAL
  Filled 2012-07-12: qty 2

## 2012-07-12 NOTE — Care Management Note (Signed)
    Page 1 of 1   07/13/2012     2:08:03 PM   CARE MANAGEMENT NOTE 07/13/2012  Patient:  Priscilla Stevens, Priscilla Stevens   Account Number:  [id number]  Date Initiated:  07/07/2012  Documentation initiated by:  MAYO,HENRIETTA  Subjective/Objective Assessment:   62 yr-old female adm s/p MVR; lives with spouse, active with [facility] for home health nsg     Action/Plan:   WILL FOLLOW FOR HOME NEEDS AS PT PROGRESSES   Anticipated DC Date:  07/14/2012   Anticipated DC Plan:  HOME W HOME HEALTH SERVICES      DC Planning Services  CM consult      [facility] Choice  Resumption Of Svcs/PTA Provider   Choice offered to / List presented to:          [facility] arranged  HH-1 RN      [facility] agency  [facility]   Status of service:   Medicare Important Message given?   (If response is "NO", the following Medicare IM given date fields will be blank) Date Medicare IM given:   Date Additional Medicare IM given:    Discharge Disposition:  HOME W HOME HEALTH SERVICES  Per UR Regulation:  Reviewed for med. necessity/level of care/duration of stay  If discussed at Long Length of Stay Meetings, dates discussed:    Comments:  07/13/12 Rosalita Chessman [phone] NOTIFIED [facility] OF DC HOME TODAY AND BLOOD DRAW ORDERS.  07/12/12 Seichi Kaufhold,RN,BSN [phone] PT WILL NEED RESUMPTION OF CARE ORDERS FOR HHRN PRIOR TO DC.

## 2012-07-12 NOTE — Progress Notes (Signed)
CARDIAC REHAB PHASE I   PRE:  Rate/Rhythm: 88SR  BP:  Supine:   Sitting: 104/62  Standing:    SaO2: 93%RA  MODE:  Ambulation: 540 ft   POST:  Rate/Rhythm: 103  BP:  Supine:   Sitting: 110/72  Standing:    SaO2: 93-96%RA 1128-1158 Pt walked 540 ft on RA with rolling walker and minimal asst. Tolerated well. To sitting on side of bed and set up lunch after walk.    Luetta Nutting, RN BSN  07/12/2012 11:54 AM

## 2012-07-12 NOTE — Discharge Summary (Signed)
[address]            [name] [address]          [phone]         Discharge Summary  Name: Priscilla Stevens DOB: June 12, 1950 62 y.o. MRN: [id number]   Admission Date: 07/07/2012 Discharge Date: 07/13/2012    Admitting Diagnosis: Mitral stenosis Mitral regurgitation   Discharge Diagnosis:  Mitral stenosis Mitral regurgitation Expected postoperative blood loss anemia  Past Medical History  Diagnosis Date  . Anxiety   . Cough     Eval by pulm in past: essentially normal PFTs, cough ddx was variant asthma, upper airway cough syndrome (previously termed post nasal drip syndrome) or GERD,   . Diverticulitis   . Severe mitral regurgitation 06/20/2012  . Mitral stenosis 06/20/2012  . Morbid obesity 06/20/2012  . History of tobacco abuse 06/22/2012  . Acute on chronic diastolic heart failure 06/22/2012  . Complication of anesthesia   . PONV (postoperative nausea and vomiting)   . Pneumonia     2-3 YRS AGO   . Heart murmur   . CHF (congestive heart failure)   . GERD (gastroesophageal reflux disease)   . Headache     HX MIGRAINES   . Fibromyalgia   . CLL (chronic lymphocytic leukemia)   . Dysrhythmia     FAST HB TAKING METOPROLOL NOT FOR HTN  . Chronic diastolic congestive heart failure 07/07/2012  . S/P mitral valve replacement with metallic valve 07/07/2012    29 mm Sorin [employer] mechanical valve     Procedures: MITRAL VALVE REPLACEMENT (29 mm [employer] Optiform mechanical valve) on 07/07/2012   HPI:  The patient is a 62 y.o. female white female from [place] with no previous history of heart murmur nor any previous diagnosis of congestive heart failure. The patient denies any known history of rheumatic fever. However, the patient describes a several year history of progressive symptoms of exertional shortness of breath, orthopnea, and chronic dry nonproductive cough. She was evaluated several years ago by a pulmonologist and  found not to have sleep apnea. Cardiac catheterization performed in 2010 was notable for the absence of coronary artery disease. The patient did not have an echocardiogram performed. She states that over the past few months her breathing has been getting worse. She has history of exertional shortness of breath that has seemed to get worse in the winter months. She has chronic orthopnea and has been sleeping in a chair for several years. These symptoms have gotten worse until she finally decided to quit smoking two months ago. Despite stopping smoking, her breathing continued to get worse, and she recently began to experience episodes of paroxysmal nocturnal dyspnea which typically would improve and the patient was set up for a period of time. During this period time, she also experienced episodes of tachypalpitations associated with relatively brief episodes of acutely worse shortness of breath. Ultimately, she had a particularly severe episode of shortness of breath at rest which persisted, prompting her to call EMS. She was admitted to the hospital 2 weeks ago and noted to be in congestive heart failure with pulmonary edema and small bilateral pleural effusions. CT angiogram of the chest was performed and notably absent for signs of pulmonary embolus. Troponin levels were borderline elevated. She was seen in consultation by Dr. Eden Emms and an echocardiogram was performed demonstrating normal left ventricular systolic function  with moderate to severe mitral stenosis and moderate to severe mitral regurgitation. She underwent left and right heart catheterization by Dr. Shirlee Latch and was found to have normal coronary artery anatomy with no significant coronary artery disease. Pulmonary artery pressures were moderately elevated. 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 Cardiothoracic surgical consultation was requested to consider surgical intervention and Dr. Cornelius Moras saw her in consultation on 06/22/2012 to discuss possible elective surgical intervention. He agreed with the need for replacement of her rheumatic valve.  She was felt to be an acceptable candidate for a minimally invasive approach.  All risks, benefits and alternatives of surgery were explained in detail, and the patient agreed to proceed.      Hospital Course:  The patient was admitted to [facility] on 07/07/2012.   The patient was taken to the operating room and underwent the above procedure.  Initially, a minimally invasive approach was attempted, but was not technically feasible.  Therefore, her surgery was converted to a traditional sternotomy approach.  The postoperative course has been generally uneventful.  She had some mild confusion which resolved with conservative management.  She also has been volume overloaded, and was started on Lasix, to which she is responding well.  Anticoagulation was initiated with Coumadin, and her INR is slowly trending upward.  She has remained in sinus rhythm and her blood pressures have been low normal.  She is tolerating a regular diet and ambulating in the halls.  She has been evaluated on today's date and is ready for discharge home.  A home health RN has been arranged for post-discharge care, and will draw a PT/INR on 07/15/2012, with results to be called to [facility] for management.    Recent vital signs:  Filed Vitals:   07/12/12 0449  BP: 107/57  Pulse: 91  Temp: 98.4 F (36.9 C)  Resp: 19    Recent laboratory studies:  CBC: Recent Labs  07/11/12 0726 07/12/12 0500  WBC 10.9* 9.6  HGB 10.3* 10.4*  HCT 30.6* 31.8*  PLT 125* 164   BMET:  Recent Labs  07/11/12 0726 07/12/12 0500  NA 139 140  K 3.7 3.5  CL 102 103  CO2 26 27  GLUCOSE 102* 96  BUN 9 11  CREATININE 0.70 0.79  CALCIUM 8.9 9.0      PT/INR:  Lab Results  Component Value Date   INR 1.81* 07/13/2012   INR 1.20 07/12/2012   INR 1.14 07/11/2012    Discharge Medications:     Medication List    STOP taking these medications       Rivaroxaban 20 MG Tabs  Commonly known as:  XARELTO      TAKE these medications       ABILIFY 5 MG tablet  Generic drug:  ARIPiprazole  Take 7.5 mg by mouth daily.     ALPRAZolam 0.25 MG tablet  Commonly known as:  XANAX  Take 1 tablet (0.25 mg total) by mouth at bedtime as needed for sleep or anxiety.     amiodarone 200 MG tablet  Commonly known as:  PACERONE  Take 1 tablet (200 mg total) by mouth 2 (two) times daily after a meal.     atorvastatin 10 MG tablet  Commonly known as:  LIPITOR  Take 10 mg by mouth daily.     calcium carbonate 600 MG  Tabs  Commonly known as:  OS-CAL  Take 600 mg by mouth daily.     furosemide 40 MG tablet  Commonly known as:  LASIX  Take 1 tablet (40 mg total) by mouth daily. X 1 week     HYDROcodone-acetaminophen 5-325 MG per tablet  Commonly known as:  NORCO/VICODIN  Take 1 tablet by mouth every 6 (six) hours as needed for pain.     metoprolol succinate 25 MG 24 hr tablet  Commonly known as:  TOPROL-XL  Take 1 tablet (25 mg total) by mouth 2 (two) times daily.     potassium chloride SA 20 MEQ tablet  Commonly known as:  K-DUR,KLOR-CON  Take 1 tablet (20 mEq total) by mouth daily. X 1 week     PREVACID PO  Take 1 tablet by mouth daily. OTC     promethazine 25 MG tablet  Commonly known as:  PHENERGAN  Take 25 mg by mouth as needed for nausea.     traMADol 50 MG tablet  Commonly known as:  ULTRAM  Take 1 tablet (50 mg total) by mouth every 6 (six) hours as needed for pain.     traZODone 100 MG tablet  Commonly known as:  DESYREL  Take 100 mg by mouth at bedtime.     venlafaxine XR 150 MG 24 hr capsule  Commonly known as:  EFFEXOR-XR  Take 1 capsule (150 mg total) by mouth daily.     warfarin 5 MG tablet  Commonly known as:   COUMADIN  Take 1 tablet (5 mg total) by mouth daily. Or as directed by Coumadin Clinic        Discharge Instructions:  The patient is to refrain from driving, heavy lifting or strenuous activity.  May shower daily and clean incisions with soap and water.  May resume regular diet.   Follow Up:       Future Appointments Provider Department Dept Phone   08/08/2012 4:30 PM Purcell Nails, MD [facility] [phone]     Follow-up Information   Follow up with Purcell Nails, MD On 08/08/2012. (Have a chest x-ray at 3:00, then see MD at 4:00)    Contact information:   [address] [place] [address] [phone]       Follow up with Marca Ancona, MD. Schedule an appointment as soon as possible for a visit in 2 weeks.   Contact information:   [address] [place] [address] [phone]       Follow up with [facility] On 07/15/2012. (Have bloodwork for Coumadin (PT/INR) drawn by Home Health RN)    Contact information:   [address] [place] [address] [phone]       Adella Hare 07/12/2012, 9:15 AM

## 2012-07-12 NOTE — Progress Notes (Addendum)
                    [address]            [name] [address]          [phone]     5 Days Post-Op Procedure(s) (LRB): INTRAOPERATIVE TRANSESOPHAGEAL ECHOCARDIOGRAM (N/A) MITRAL VALVE (MV) REPLACEMENT (N/A)  Subjective: Feels well, no complaints.   Objective: Vital signs in last 24 hours: Patient Vitals for the past 24 hrs:  BP Temp Temp src Pulse Resp SpO2 Weight  07/12/12 0449 107/57 mmHg 98.4 F (36.9 C) Oral 91 19 91 % 221 lb 9 oz (100.5 kg)  07/11/12 1956 104/64 mmHg 98 F (36.7 C) Oral 96 17 98 % -  07/11/12 1932 - - - - - 92 % -  07/11/12 1500 - - - - - 92 % -  07/11/12 1428 100/69 mmHg 97.5 F (36.4 C) Oral 94 19 97 % -  07/11/12 1153 86/60 mmHg 97.4 F (36.3 C) Oral 104 19 99 % -  07/11/12 1100 109/54 mmHg - - 95 20 100 % -  07/11/12 1000 104/57 mmHg - - 95 16 100 % -  07/11/12 0942 - - - - - 97 % -  07/11/12 0900 102/57 mmHg - - 100 21 97 % -  07/11/12 0800 111/63 mmHg - - 95 23 99 % -   Current Weight  07/12/12 221 lb 9 oz (100.5 kg)  PRE-OPERATIVE WEIGHT: 97.9 kg    Intake/Output from previous day: 04/07 0701 - 04/08 0700 In: 490 [P.O.:480; I.V.:10] Out: 2775 [Urine:2775]    PHYSICAL EXAM:  Heart: RRR Lungs: Clear Wound: Clean and dry Extremities:Trace LE edema    Lab Results: CBC: Recent Labs  07/11/12 0726 07/12/12 0500  WBC 10.9* 9.6  HGB 10.3* 10.4*  HCT 30.6* 31.8*  PLT 125* 164   BMET:  Recent Labs  07/11/12 0726 07/12/12 0500  NA 139 140  K 3.7 3.5  CL 102 103  CO2 26 27  GLUCOSE 102* 96  BUN 9 11  CREATININE 0.70 0.79  CALCIUM 8.9 9.0    PT/INR:  Recent Labs  07/12/12 0500  LABPROT 15.0  INR 1.20      Assessment/Plan: S/P Procedure(s) (LRB): INTRAOPERATIVE TRANSESOPHAGEAL ECHOCARDIOGRAM (N/A) MITRAL VALVE (MV) REPLACEMENT (N/A)  CV- SR, BPs improving. Continue current meds.   Anticoagulation for mechanical valve-  INR still subtherapeutic. Will give increased Coumadin dose today  and monitor INR.  Vol overload- continue diuresis.   Hypokalemia- replace K+.  CXR not done yet this am.  Will follow up.  CRPI, pulm toilet.  D/c pacing wires.  Possibly home soon if INR is closer to therapeutic.    LOS: 5 days    COLLINS,GINA H 07/12/2012  I have seen and examined the patient and agree with the assessment and plan as outlined.  OWEN,CLARENCE H 07/12/2012 9:02 AM

## 2012-07-12 NOTE — Progress Notes (Signed)
Physical Therapy Treatment Patient Details Name: Priscilla Stevens MRN: [id number] DOB: 13-Feb-1951 Today's Date: 07/12/2012 Time: 0102-7253 PT Time Calculation (min): 24 min  PT Assessment / Plan / Recommendation Comments on Treatment Session  Pt s/p MVR with decr mobility secondary to decr endurance and decr balance.  Progressing ambulation stability and practiced up and down steps.      Follow Up Recommendations  Home health PT;Supervision/Assistance - 24 hour                 Equipment Recommendations  None recommended by PT;Other (comment) (to borrow RW per pt)        Frequency Min 3X/week   Plan Discharge plan remains appropriate;Frequency remains appropriate    Precautions / Restrictions Precautions Precautions: Fall;Sternal Restrictions Weight Bearing Restrictions: No   Pertinent Vitals/Pain VSS, No pain    Mobility  Bed Mobility Bed Mobility: Supine to Sit Supine to Sit: 5: Supervision;HOB flat Sit to Supine: 5: Supervision;HOB flat Details for Bed Mobility Assistance: Pt needed less cues for sternal precautions.  Transfers Transfers: Stand to Sit;Sit to Stand Sit to Stand: 4: Min guard;Without upper extremity assist;From bed Stand to Sit: 4: Min guard;Without upper extremity assist;To bed Details for Transfer Assistance: Pt needed cues for hand placement for sternal precautions. Ambulation/Gait Ambulation/Gait Assistance: 4: Min guard Ambulation Distance (Feet): 400 Feet Assistive device: None Ambulation/Gait Assistance Details: Gait improves daily.  Still guarded but swinging UEs a little more.  Pt becoming more confident without device.  Told me that she was going to borrow a RW from her sister in law.   Gait Pattern: Step-through pattern;Decreased step length - left;Decreased step length - right;Decreased stride length;Wide base of support Gait velocity: decreased Stairs: Yes Stairs Assistance: 4: Min guard Stair Management Technique: No  rails;Forwards;Other (comment) (instructed in how husband can assist with HHA and gait belt) Number of Stairs: 4 Wheelchair Mobility Wheelchair Mobility: No    PT Goals Acute Rehab PT Goals PT Goal: Supine/Side to Sit - Progress: Progressing toward goal PT Goal: Sit to Stand - Progress: Progressing toward goal PT Goal: Ambulate - Progress: Progressing toward goal PT Goal: Up/Down Stairs - Progress: Progressing toward goal Additional Goals PT Goal: Additional Goal #1 - Progress: Progressing toward goal  Visit Information  Last PT Received On: 07/12/12 Assistance Needed: +1    Subjective Data  Subjective: "I want to practice the steps."   Cognition  Cognition Overall Cognitive Status: Appears within functional limits for tasks assessed/performed Arousal/Alertness: Awake/alert Orientation Level: Appears intact for tasks assessed Behavior During Session: [facility] for tasks performed    Balance  Static Standing Balance Static Standing - Balance Support: During functional activity;No upper extremity supported Static Standing - Level of Assistance: 5: Stand by assistance Static Standing - Comment/# of Minutes: 2 Dynamic Standing Balance Dynamic Standing - Balance Support: No upper extremity supported;During functional activity Dynamic Standing - Level of Assistance: 5: Stand by assistance Dynamic Standing - Balance Activities: Lateral lean/weight shifting;Forward lean/weight shifting;Reaching for objects  End of Session PT - End of Session Equipment Utilized During Treatment: Gait belt Activity Tolerance: Patient limited by fatigue Patient left: in bed;with call bell/phone within reach;with family/visitor present Nurse Communication: Mobility status       INGOLD,Calirose Mccance 07/12/2012, 3:42 PM  [facility] [phone] (pager)

## 2012-07-12 NOTE — Progress Notes (Signed)
Patient said she did not want to walk again this evening she is tired and ready for bed.

## 2012-07-12 NOTE — Anesthesia Postprocedure Evaluation (Signed)
  Anesthesia Post-op Note  Patient: Priscilla Stevens  Procedure(s) Performed: Procedure(s): INTRAOPERATIVE TRANSESOPHAGEAL ECHOCARDIOGRAM (N/A) MITRAL VALVE (MV) REPLACEMENT (N/A)  Patient Location: Nursing Unit  Anesthesia Type:General  Level of Consciousness: awake, alert  and oriented  Airway and Oxygen Therapy: Patient Spontanous Breathing  Post-op Pain: none  Post-op Assessment: Post-op Vital signs reviewed, Patient's Cardiovascular Status Stable and Respiratory Function Stable  Post-op Vital Signs: Reviewed and stable  Complications: No apparent anesthesia complications

## 2012-07-12 NOTE — Progress Notes (Signed)
Chest Tube sutures removed per protocol, edges intact benzoin and steristrips applied Egbert Garibaldi A

## 2012-07-13 MED ORDER — FUROSEMIDE 40 MG PO TABS: 40.0000 mg | ORAL_TABLET | Freq: Every day | ORAL | Status: DC

## 2012-07-13 MED ORDER — AMIODARONE HCL 200 MG PO TABS: 200.0000 mg | ORAL_TABLET | Freq: Two times a day (BID) | ORAL | Status: DC

## 2012-07-13 MED ORDER — TRAMADOL HCL 50 MG PO TABS: 50.0000 mg | ORAL_TABLET | Freq: Four times a day (QID) | ORAL | Status: DC | PRN

## 2012-07-13 MED ORDER — WARFARIN SODIUM 5 MG PO TABS: 5.0000 mg | ORAL_TABLET | Freq: Every day | ORAL | Status: DC

## 2012-07-13 MED ORDER — POTASSIUM CHLORIDE CRYS ER 20 MEQ PO TBCR: 20.0000 meq | EXTENDED_RELEASE_TABLET | Freq: Every day | ORAL | Status: DC

## 2012-07-13 NOTE — Progress Notes (Addendum)
                    [address]            [place] [address]          [phone]     6 Days Post-Op Procedure(s) (LRB): INTRAOPERATIVE TRANSESOPHAGEAL ECHOCARDIOGRAM (N/A) MITRAL VALVE (MV) REPLACEMENT (N/A)  Subjective: Feels well, coughed a lot overnight, but otherwise stable.   Objective: Vital signs in last 24 hours: Patient Vitals for the past 24 hrs:  BP Temp Temp src Pulse Resp SpO2 Weight  07/13/12 0602 92/64 mmHg 97 F (36.1 C) Oral 96 18 97 % 220 lb 3.8 oz (99.9 kg)  07/12/12 2117 92/60 mmHg 98.1 F (36.7 C) Oral 88 18 98 % -  07/12/12 2012 - - - - - 95 % -  07/12/12 1835 100/62 mmHg - - - - - -  07/12/12 1323 110/64 mmHg 98.3 F (36.8 C) Oral 93 18 95 % -  07/12/12 1029 106/71 mmHg - - 90 - - -   Current Weight  07/13/12 220 lb 3.8 oz (99.9 kg)  PRE-OPERATIVE WEIGHT: 97.9 kg    Intake/Output from previous day: 04/08 0701 - 04/09 0700 In: 480 [P.O.:480] Out: 800 [Urine:800]    PHYSICAL EXAM:  Heart: RRR Lungs: Few coarse BS on L that clear with cough Wound: Clean and dry Extremities: Mild LE edema    Lab Results: CBC: Recent Labs  07/11/12 0726 07/12/12 0500  WBC 10.9* 9.6  HGB 10.3* 10.4*  HCT 30.6* 31.8*  PLT 125* 164   BMET:  Recent Labs  07/11/12 0726 07/12/12 0500  NA 139 140  K 3.7 3.5  CL 102 103  CO2 26 27  GLUCOSE 102* 96  BUN 9 11  CREATININE 0.70 0.79  CALCIUM 8.9 9.0    PT/INR:  Recent Labs  07/13/12 0600  LABPROT 20.3*  INR 1.81*      Assessment/Plan: S/P Procedure(s) (LRB): INTRAOPERATIVE TRANSESOPHAGEAL ECHOCARDIOGRAM (N/A) MITRAL VALVE (MV) REPLACEMENT (N/A) CV- SR, BPs stable. Continue current meds.  Anticoagulation for mechanical valve- INR bumped from yesterday.  Target range 2-3. Vol overload- continue diuresis.  Hypokalemia- replace K+.  Since INR is trending up and she is doing well, anticipate discharge home today.  Will discuss with MD.  Will have home health plan to  draw INR.   LOS: 6 days    COLLINS,GINA H 07/13/2012  I have seen and examined the patient and agree with the assessment and plan as outlined.  D/C home on coumadin 5 mg daily.  Minami Arriaga H 07/13/2012 9:43 AM

## 2012-07-13 NOTE — Progress Notes (Signed)
[id number] Education completed with pt and wife. Understanding voiced. Discussed CRP 2 but pt declined. Wants to ex on her own. Brother brought RW for pt to use. Encouraged IS and flutter valve. Pt and husband have quit smoking since JAN. Put on discharge video for pt and husband to view. Luetta Nutting RNBSN

## 2012-07-15 ENCOUNTER — Encounter (HOSPITAL_COMMUNITY): Payer: Self-pay | Admitting: Anesthesiology

## 2012-07-15 ENCOUNTER — Other Ambulatory Visit: Payer: Self-pay | Admitting: *Deleted

## 2012-07-15 ENCOUNTER — Ambulatory Visit (INDEPENDENT_AMBULATORY_CARE_PROVIDER_SITE_OTHER): Payer: Medicare Other | Admitting: Internal Medicine

## 2012-07-15 DIAGNOSIS — I059 Rheumatic mitral valve disease, unspecified: Secondary | ICD-10-CM

## 2012-07-15 DIAGNOSIS — Z954 Presence of other heart-valve replacement: Secondary | ICD-10-CM

## 2012-07-15 LAB — POCT INR: INR: 3.1

## 2012-07-18 ENCOUNTER — Ambulatory Visit (INDEPENDENT_AMBULATORY_CARE_PROVIDER_SITE_OTHER): Payer: Medicare Other | Admitting: Cardiology

## 2012-07-18 DIAGNOSIS — Z954 Presence of other heart-valve replacement: Secondary | ICD-10-CM

## 2012-07-18 DIAGNOSIS — I059 Rheumatic mitral valve disease, unspecified: Secondary | ICD-10-CM

## 2012-07-18 LAB — POCT INR: INR: 1.8

## 2012-07-22 ENCOUNTER — Ambulatory Visit (INDEPENDENT_AMBULATORY_CARE_PROVIDER_SITE_OTHER): Payer: Medicare Other | Admitting: Cardiology

## 2012-07-22 DIAGNOSIS — I059 Rheumatic mitral valve disease, unspecified: Secondary | ICD-10-CM

## 2012-07-22 DIAGNOSIS — Z954 Presence of other heart-valve replacement: Secondary | ICD-10-CM

## 2012-07-22 LAB — POCT INR: INR: 2.7

## 2012-07-25 ENCOUNTER — Encounter: Payer: Self-pay | Admitting: Nurse Practitioner

## 2012-07-29 ENCOUNTER — Encounter: Payer: Self-pay | Admitting: Nurse Practitioner

## 2012-07-29 ENCOUNTER — Ambulatory Visit (INDEPENDENT_AMBULATORY_CARE_PROVIDER_SITE_OTHER): Payer: Medicare Other | Admitting: Nurse Practitioner

## 2012-07-29 ENCOUNTER — Ambulatory Visit (INDEPENDENT_AMBULATORY_CARE_PROVIDER_SITE_OTHER): Payer: Medicare Other | Admitting: *Deleted

## 2012-07-29 VITALS — BP 110/64 | HR 58 | Ht 65.0 in | Wt 213.0 lb

## 2012-07-29 DIAGNOSIS — I059 Rheumatic mitral valve disease, unspecified: Secondary | ICD-10-CM

## 2012-07-29 DIAGNOSIS — Z954 Presence of other heart-valve replacement: Secondary | ICD-10-CM

## 2012-07-29 DIAGNOSIS — Z952 Presence of prosthetic heart valve: Secondary | ICD-10-CM

## 2012-07-29 LAB — BASIC METABOLIC PANEL
BUN: 10 mg/dL (ref 6–23)
CO2: 29 mEq/L (ref 19–32)
Calcium: 9.2 mg/dL (ref 8.4–10.5)
Chloride: 100 mEq/L (ref 96–112)
Creatinine, Ser: 1.1 mg/dL (ref 0.4–1.2)
GFR: 55.71 mL/min — ABNORMAL LOW (ref >60.00)
Glucose, Bld: 94 mg/dL (ref 70–99)
Potassium: 4.6 mEq/L (ref 3.5–5.1)
Sodium: 137 mEq/L (ref 135–145)

## 2012-07-29 LAB — CBC WITH DIFFERENTIAL/PLATELET
Basophils Absolute: 0.1 10*3/uL (ref 0.0–0.1)
Basophils Relative: 0.5 % (ref 0.0–3.0)
Eosinophils Absolute: 0.5 10*3/uL (ref 0.0–0.7)
Eosinophils Relative: 3.4 % (ref 0.0–5.0)
HCT: 36.7 % (ref 36.0–46.0)
Hemoglobin: 11.8 g/dL — ABNORMAL LOW (ref 12.0–15.0)
Lymphocytes Relative: 24.1 % (ref 12.0–46.0)
Lymphs Abs: 3.2 10*3/uL (ref 0.7–4.0)
MCHC: 32.3 g/dL (ref 30.0–36.0)
MCV: 83.4 fl (ref 78.0–100.0)
Monocytes Absolute: 1.5 10*3/uL — ABNORMAL HIGH (ref 0.1–1.0)
Monocytes Relative: 11.6 % (ref 3.0–12.0)
Neutro Abs: 8.1 10*3/uL — ABNORMAL HIGH (ref 1.4–7.7)
Neutrophils Relative %: 60.4 % (ref 43.0–77.0)
Platelets: 306 10*3/uL (ref 150.0–400.0)
RBC: 4.4 Mil/uL (ref 3.87–5.11)
RDW: 16 % — ABNORMAL HIGH (ref 11.5–14.6)
WBC: 13.4 10*3/uL — ABNORMAL HIGH (ref 4.5–10.5)

## 2012-07-29 MED ORDER — AMIODARONE HCL 200 MG PO TABS: 200.0000 mg | ORAL_TABLET | Freq: Every day | ORAL | Status: DC

## 2012-07-29 NOTE — Progress Notes (Signed)
Priscilla Stevens Date of Birth: 06/19/1950 Medical Record #[id number]  History of Present Illness: Priscilla Stevens is seen back today for a post hospital visit. She is seen for Priscilla Stevens. She has a history of diastolic heart failure, GERD, morbid obesity, rheumatic mitral valve disease, COPD with past tobacco use, CLL, anxiety, and fibromyalgia.   Was most recently admitted with acute on chronic diastolic heart failure. Felt to have rheumatic mitral valve disease with at least moderate mitral stenosis and moderate to severe MR. Referred to Priscilla Stevens with plans for elective MV repair or replacement surgery. Amiodarone was to be started pre op to decrease her risk of perioperative atrial arrhythmias.   She underwent MVR with a #29 mm Carbomedics Optiform mechanical valve on 07/07/12 per Priscilla Stevens. Minimally invasive approach was attempted, but not technically feasible and therefore had sternotomy approach.   She comes in today. She is here with family members. Doing well. Walking some. Has not heard from cardiac rehab. Not sure if she is going to go. Breathing has improved. No swelling. Was dizzy yesterday and her blood pressure was low. Still taking her Lasix and potassium - which had been for just one week. Has been home now 2 weeks. Family thinks she is doing ok. Tolerating her coumadin. No bleeding. Some bruising. No fever or chills.    Current Outpatient Prescriptions on File Prior to Visit  Medication Sig Dispense Refill  . ALPRAZolam (XANAX) 0.25 MG tablet Take 1 tablet (0.25 mg total) by mouth at bedtime as needed for sleep or anxiety.  30 tablet  0  . amiodarone (PACERONE) 200 MG tablet Take 1 tablet (200 mg total) by mouth 2 (two) times daily after a meal.  60 tablet  1  . ARIPiprazole (ABILIFY) 5 MG tablet Take 7.5 mg by mouth daily.      Marland Kitchen atorvastatin (LIPITOR) 10 MG tablet Take 10 mg by mouth daily.      . calcium carbonate (OS-CAL) 600 MG TABS Take 600 mg by mouth daily.      .  furosemide (LASIX) 40 MG tablet Take 1 tablet (40 mg total) by mouth daily. X 1 week  7 tablet  0  . HYDROcodone-acetaminophen (NORCO/VICODIN) 5-325 MG per tablet Take 1 tablet by mouth every 6 (six) hours as needed for pain.      . Lansoprazole (PREVACID PO) Take 1 tablet by mouth daily. OTC      . metoprolol succinate (TOPROL-XL) 25 MG 24 hr tablet Take 1 tablet (25 mg total) by mouth 2 (two) times daily.  60 tablet  1  . potassium chloride SA (K-DUR,KLOR-CON) 20 MEQ tablet Take 1 tablet (20 mEq total) by mouth daily. X 1 week  7 tablet  0  . promethazine (PHENERGAN) 25 MG tablet Take 25 mg by mouth as needed for nausea.      . traMADol (ULTRAM) 50 MG tablet Take 1 tablet (50 mg total) by mouth every 6 (six) hours as needed for pain.  30 tablet  0  . traZODone (DESYREL) 100 MG tablet Take 100 mg by mouth at bedtime.      Marland Kitchen venlafaxine XR (EFFEXOR-XR) 150 MG 24 hr capsule Take 1 capsule (150 mg total) by mouth daily.  30 capsule  1  . warfarin (COUMADIN) 5 MG tablet Take 1 tablet (5 mg total) by mouth daily. Or as directed by Coumadin Clinic  60 tablet  2   No current facility-administered medications on file prior to visit.  Allergies  Allergen Reactions  . Aspirin     REACTION: GI upset    Past Medical History  Diagnosis Date  . Anxiety   . Cough     Eval by pulm in past: essentially normal PFTs, cough ddx was variant asthma, upper airway cough syndrome (previously termed post nasal drip syndrome) or GERD,   . Diverticulitis   . Severe mitral regurgitation 06/20/2012  . Mitral stenosis 06/20/2012  . Morbid obesity 06/20/2012  . History of tobacco abuse 06/22/2012  . Acute on chronic diastolic heart failure 06/22/2012  . Complication of anesthesia   . PONV (postoperative nausea and vomiting)   . Pneumonia     2-3 YRS AGO   . Heart murmur   . CHF (congestive heart failure)   . GERD (gastroesophageal reflux disease)   . Headache     HX MIGRAINES   . Fibromyalgia   . CLL (chronic  lymphocytic leukemia)   . Dysrhythmia     FAST HB TAKING METOPROLOL NOT FOR HTN  . Chronic diastolic congestive heart failure 07/07/2012  . S/P mitral valve replacement with metallic valve 07/07/2012    29 mm Sorin Carbomedics mechanical valve    Past Surgical History  Procedure Laterality Date  . Appendectomy    . Total vaginal hysterectomy    . Tonsillectomy    . Tee without cardioversion N/A 06/22/2012    Procedure: TRANSESOPHAGEAL ECHOCARDIOGRAM (TEE);  Surgeon: Priscilla Morale, MD;  Location[facility] ENDOSCOPY;  Service: Cardiovascular;  Laterality: N/A;  . No past surgeries      TUBAL PREG   . Intraoperative transesophageal echocardiogram N/A 07/07/2012    Procedure: INTRAOPERATIVE TRANSESOPHAGEAL ECHOCARDIOGRAM;  Surgeon: Priscilla Nails, MD;  Location: [facility] OR;  Service: Open Heart Surgery;  Laterality: N/A;  . Mitral valve replacement N/A 07/07/2012    Procedure: MITRAL VALVE (MV) REPLACEMENT;  Surgeon: Priscilla Nails, MD;  Location: [facility] OR;  Service: Open Heart Surgery;  Laterality: N/A;    History  Smoking status  . Former Smoker -- 1.00 packs/day for 30 years  . Quit date: 04/06/2012  Smokeless tobacco  . Never Used    Comment: Quit January 2014. Smoked for 25 yrs.    History  Alcohol Use  . 0.0 oz/week    Comment: 2x/year    Family History  Problem Relation Age of Onset  . Congestive Heart Failure Mother     Review of Systems: The review of systems is per the HPI.  All other systems were reviewed and are negative.  Physical Exam: BP 110/64  Pulse 58  Ht 5\' 5"  (1.651 m)  Wt 213 lb (96.616 kg)  BMI 35.44 kg/m2 Patient is very pleasant and in no acute distress. Skin is warm and dry. Color is normal.  HEENT is unremarkable. Normocephalic/atraumatic. PERRL. Sclera are nonicteric. Neck is supple. No masses. No JVD. Lungs are clear - little decreased in the bases. Cardiac exam shows a regular rate and rhythm. Valve is crisp. Sternum looks ok. Wearing her bra. Abdomen is  obese but soft. Extremities are without edema. Gait and ROM are intact. No gross neurologic deficits noted.  LABORATORY DATA: EKG today shows sinus rhythm with 1st degree AV block. QT is 422. QTc is 495.  Lab Results  Component Value Date   WBC 9.6 07/12/2012   HGB 10.4* 07/12/2012   HCT 31.8* 07/12/2012   PLT 164 07/12/2012   GLUCOSE 96 07/12/2012   CHOL 133 06/21/2012   TRIG 88 06/21/2012  HDL 51 06/21/2012   LDLCALC 64 06/21/2012   ALT 15 07/04/2012   AST 25 07/04/2012   NA 140 07/12/2012   K 3.5 07/12/2012   CL 103 07/12/2012   CREATININE 0.79 07/12/2012   BUN 11 07/12/2012   CO2 27 07/12/2012   TSH 1.812 06/20/2012   INR 2.0 07/29/2012   HGBA1C 5.9* 07/04/2012     Assessment / Plan: 1. S/P MVR - doing ok - check baseline labs today. Refer to cardiac rehab. Check echo in 3 weeks and have her follow up with Priscilla Stevens. Have reviewed activity and lifting restrictions. Overall she is felt to be doing well.   2. Lightheadedness - does not look to be volume overloaded - I have stopped the lasix and potassium  3. Prolonged QT on EKG - I have cut her amiodarone back to just one a day. Do not feel this will be a long term drug.  4. Chronic coumadin - amiodarone was cut back today. Her dose needs readjusting. I have alerted the coumadin clinic.   Patient is agreeable to this plan and will call if any problems develop in the interim.   Rosalio Macadamia, RN, ANP-C [facility] [address] [place], [place]  [address]

## 2012-07-29 NOTE — Patient Instructions (Addendum)
Stop the Lasix and Potassium  Cut the amiodarone back to just one a day  We will check labs today  Echo in 3 weeks with follow up with Dr. Shirlee Latch  Walking every day is encouraged  Call the [facility] office at [phone] if you have any questions, problems or concerns.

## 2012-08-04 ENCOUNTER — Other Ambulatory Visit: Payer: Self-pay | Admitting: *Deleted

## 2012-08-04 DIAGNOSIS — I059 Rheumatic mitral valve disease, unspecified: Secondary | ICD-10-CM

## 2012-08-04 LAB — POCT INR: INR: 2.6

## 2012-08-05 ENCOUNTER — Ambulatory Visit (INDEPENDENT_AMBULATORY_CARE_PROVIDER_SITE_OTHER): Payer: Medicare Other | Admitting: Cardiology

## 2012-08-05 DIAGNOSIS — I059 Rheumatic mitral valve disease, unspecified: Secondary | ICD-10-CM

## 2012-08-05 DIAGNOSIS — Z954 Presence of other heart-valve replacement: Secondary | ICD-10-CM

## 2012-08-08 ENCOUNTER — Ambulatory Visit
Admission: RE | Admit: 2012-08-08 | Discharge: 2012-08-08 | Disposition: A | Discharge status: Home / Self Care | Payer: Medicare Other | Source: Ambulatory Visit | Attending: Thoracic Surgery (Cardiothoracic Vascular Surgery) | Admitting: Thoracic Surgery (Cardiothoracic Vascular Surgery)

## 2012-08-08 ENCOUNTER — Ambulatory Visit (INDEPENDENT_AMBULATORY_CARE_PROVIDER_SITE_OTHER): Payer: Self-pay | Admitting: Thoracic Surgery (Cardiothoracic Vascular Surgery)

## 2012-08-08 ENCOUNTER — Encounter: Payer: Self-pay | Admitting: Thoracic Surgery (Cardiothoracic Vascular Surgery)

## 2012-08-08 VITALS — BP 118/69 | HR 92 | Resp 20 | Ht 65.0 in | Wt 213.0 lb

## 2012-08-08 DIAGNOSIS — I059 Rheumatic mitral valve disease, unspecified: Secondary | ICD-10-CM

## 2012-08-08 DIAGNOSIS — I340 Nonrheumatic mitral (valve) insufficiency: Secondary | ICD-10-CM

## 2012-08-08 DIAGNOSIS — Z952 Presence of prosthetic heart valve: Secondary | ICD-10-CM

## 2012-08-08 DIAGNOSIS — Z954 Presence of other heart-valve replacement: Secondary | ICD-10-CM

## 2012-08-08 NOTE — Progress Notes (Signed)
[address]            [name] [address]          [phone]     CARDIOTHORACIC SURGERY OFFICE NOTE  Referring Provider is Laurey Morale, MD PCP is [name], MD   HPI:  Patient returns for routine followup now 4 weeks status post 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 Her postoperative recovery has been uncomplicated. Since hospital discharge she has been seen in one occasion by Norma Fredrickson who cut her dose of amiodarone to 200 mg daily and stopped lasix and potassium. Her Coumadin dose continues to be adjusted and monitored through the [facility].  Patient returns to the office today and reports that she is doing very well. She has mild residual soreness in her chest. She has no shortness of breath. She is not having tachypalpitations. She states that occasionally she feels transiently lightheaded but this has been very brief and not problematic. Her appetite is good. She is sleeping well at night.   Current Outpatient Prescriptions  Medication Sig Dispense Refill  . ALPRAZolam (XANAX) 0.25 MG tablet Take 1 tablet (0.25 mg total) by mouth at bedtime as needed for sleep or anxiety.  30 tablet  0  . amiodarone (PACERONE) 200 MG tablet Take 1 tablet (200 mg total) by mouth daily.  60 tablet  1  . ARIPiprazole (ABILIFY) 5 MG tablet Take 7.5 mg by mouth daily.      Marland Kitchen atorvastatin (LIPITOR) 10 MG tablet Take 10 mg by mouth daily.      . calcium carbonate (OS-CAL) 600 MG TABS Take 600 mg by mouth daily.      Marland Kitchen HYDROcodone-acetaminophen (NORCO/VICODIN) 5-325 MG per tablet Take 1 tablet by mouth every 6 (six) hours as needed for pain.      . Lansoprazole (PREVACID PO) Take 1 tablet by mouth daily. OTC      . metoprolol succinate (TOPROL-XL) 25 MG 24 hr tablet Take 1 tablet (25 mg total) by mouth 2 (two) times daily.  60 tablet  1  . promethazine (PHENERGAN) 25 MG tablet Take 25 mg by  mouth as needed for nausea.      . traMADol (ULTRAM) 50 MG tablet Take 1 tablet (50 mg total) by mouth every 6 (six) hours as needed for pain.  30 tablet  0  . traZODone (DESYREL) 100 MG tablet Take 100 mg by mouth at bedtime.      Marland Kitchen venlafaxine XR (EFFEXOR-XR) 150 MG 24 hr capsule Take 1 capsule (150 mg total) by mouth daily.  30 capsule  1  . warfarin (COUMADIN) 5 MG tablet Take 1 tablet (5 mg total) by mouth daily. Or as directed by Coumadin Clinic  60 tablet  2   No current facility-administered medications for this visit.      Physical Exam:   BP 118/69  Pulse 92  Resp 20  Ht 5\' 5"  (1.651 m)  Wt 213 lb (96.616 kg)  BMI 35.44 kg/m2  SpO2 97%  General:  Well-appearing  Chest:   Clear to auscultation with symmetrical breath sounds  CV:   Regular rate and rhythm with mechanical heart sounds  Incisions:  Healing nicely, sternum is stable  Abdomen:  Soft and nontender  Extremities:  Warm and well-perfused  Diagnostic Tests:  *RADIOLOGY REPORT*  Clinical Data: Shortness  of breath, former smoker, post MVR  07/07/2012  CHEST - 2 VIEW  Comparison: 42,014  Findings:  Enlargement of cardiac silhouette post median sternotomy and MVR.  Mediastinal contours and pulmonary vascularity normal.  Tiny residual pleural effusions.  No acute infiltrate or pneumothorax.  Bones unremarkable.  IMPRESSION:  Tiny residual pleural effusions slightly greater on left.  Enlargement of cardiac silhouette post MVR.  Original Report Authenticated By: Ulyses Southward, M.D.    Impression:  Patient is progressing well now 4 weeks status post mitral valve replacement using a mechanical prosthesis.  Plan:  I've encouraged patient to continue to gradually increase her activity as tolerated but I have reminded her to refrain from any sort of heavy lifting or strenuous use of her arms or shoulders for at least 2 more months. I've encouraged her to go ahead and get started in the cardiac rehabilitation  program. Once she no longer has any sort of transient dizzy spells I think she could resume driving an automobile.  I've instructed the patient to her dose of amiodarone to 100 mg daily and stop it completely when her current prescription runs out. All of her questions been addressed. We will have her return in 3 months time for routine followup and to review her most recent echocardiogram.    Salvatore Decent. Cornelius Moras, MD 08/08/2012 3:54 PM

## 2012-08-08 NOTE — Patient Instructions (Addendum)
Decrease amiodarone to 1/2 tablet (100mg ) daily until your current prescription runs out then stop amiodarone  The patient should continue to avoid any heavy lifting or strenuous use of arms or shoulders for at least a total of three months from the time of surgery.  The patient may return to driving an automobile as long as she is no longer requiring oral narcotic pain relievers during the daytime and she is no longer having any type of dizzy spells.  It would be wise to start driving only short distances during the daylight and gradually increase from there as they feel comfortable.

## 2012-08-19 ENCOUNTER — Ambulatory Visit (HOSPITAL_COMMUNITY): Payer: Medicare Other | Attending: Cardiology | Admitting: Radiology

## 2012-08-19 ENCOUNTER — Ambulatory Visit (INDEPENDENT_AMBULATORY_CARE_PROVIDER_SITE_OTHER): Payer: Medicare Other | Admitting: *Deleted

## 2012-08-19 DIAGNOSIS — I079 Rheumatic tricuspid valve disease, unspecified: Secondary | ICD-10-CM | POA: Insufficient documentation

## 2012-08-19 DIAGNOSIS — Z954 Presence of other heart-valve replacement: Secondary | ICD-10-CM | POA: Insufficient documentation

## 2012-08-19 DIAGNOSIS — IMO0001 Reserved for inherently not codable concepts without codable children: Secondary | ICD-10-CM | POA: Insufficient documentation

## 2012-08-19 DIAGNOSIS — I359 Nonrheumatic aortic valve disorder, unspecified: Secondary | ICD-10-CM | POA: Insufficient documentation

## 2012-08-19 DIAGNOSIS — I509 Heart failure, unspecified: Secondary | ICD-10-CM | POA: Insufficient documentation

## 2012-08-19 DIAGNOSIS — I059 Rheumatic mitral valve disease, unspecified: Secondary | ICD-10-CM

## 2012-08-19 DIAGNOSIS — J811 Chronic pulmonary edema: Secondary | ICD-10-CM | POA: Insufficient documentation

## 2012-08-19 DIAGNOSIS — Z87891 Personal history of nicotine dependence: Secondary | ICD-10-CM | POA: Insufficient documentation

## 2012-08-19 DIAGNOSIS — Z952 Presence of prosthetic heart valve: Secondary | ICD-10-CM

## 2012-08-19 LAB — POCT INR: INR: 2

## 2012-08-19 NOTE — Progress Notes (Signed)
Echocardiogram performed.  

## 2012-08-25 ENCOUNTER — Ambulatory Visit (INDEPENDENT_AMBULATORY_CARE_PROVIDER_SITE_OTHER): Payer: Medicare Other | Admitting: Cardiology

## 2012-08-25 ENCOUNTER — Encounter: Payer: Self-pay | Admitting: Cardiology

## 2012-08-25 VITALS — BP 130/84 | HR 73 | Ht 65.0 in | Wt 214.0 lb

## 2012-08-25 DIAGNOSIS — I059 Rheumatic mitral valve disease, unspecified: Secondary | ICD-10-CM

## 2012-08-25 DIAGNOSIS — I5032 Chronic diastolic (congestive) heart failure: Secondary | ICD-10-CM

## 2012-08-25 DIAGNOSIS — R0600 Dyspnea, unspecified: Secondary | ICD-10-CM

## 2012-08-25 DIAGNOSIS — I509 Heart failure, unspecified: Secondary | ICD-10-CM

## 2012-08-25 DIAGNOSIS — R0989 Other specified symptoms and signs involving the circulatory and respiratory systems: Secondary | ICD-10-CM

## 2012-08-25 MED ORDER — VENLAFAXINE HCL ER 150 MG PO CP24: 150.0000 mg | ORAL_CAPSULE | Freq: Every day | ORAL | Status: DC

## 2012-08-25 MED ORDER — METOPROLOL SUCCINATE ER 25 MG PO TB24: 25.0000 mg | ORAL_TABLET | Freq: Two times a day (BID) | ORAL | Status: DC

## 2012-08-25 NOTE — Patient Instructions (Addendum)
Your physician recommends that you schedule a follow-up appointment in: 3 MONTHS   Your physician has recommended you make the following change in your medication: AMIODARONE WAS TAKEN OFF YOUR LIST  MEDS WERE REFILLED AT [facility]

## 2012-08-26 DIAGNOSIS — R0600 Dyspnea, unspecified: Secondary | ICD-10-CM | POA: Insufficient documentation

## 2012-08-26 NOTE — Progress Notes (Signed)
Patient ID: Priscilla Stevens, female   DOB: 07-23-1950, 62 y.o.   MRN: [id number] PCP: Dr. Cyndia Bent  62 yo with history of rheumatic mitral valve disease presents after mitral valve replacement with mehanical MVR in 4/14.  She did not have significant CAD on cath prior to MVR.  Her post-op course was uneventful, no atrial fibrillation.    She is stable currently.  Mild dyspnea after walking about 1/2 block.  She can climb a flight of stairs.  No PND or orthopnea.  No chest pain.  She will be starting cardiac rehab soon.    PMH: 1. Depression 2. Rheumatic heart disease: Patient developed moderate MS and moderate to severe MR.  4/14 he had MV replacement with #29 Sorin Carbomedics mitral valve.  Echo (5/14) with EF 55-60%, mild LVH, mechanical mitral valve with mean gradient 6 mmHg. 3. Morbid obesity 4. CLL 5. COPD 6. Fibromyalgia 7. LHC (3/14) with no significant CAD.   SH: Prior smoker, quit 1/14.  Married.  No ETOH.   FH: No premature CAD  ROS: All systems reviewed and negative except as per HPI.   Current Outpatient Prescriptions  Medication Sig Dispense Refill  . ALPRAZolam (XANAX) 0.25 MG tablet Take 1 tablet (0.25 mg total) by mouth at bedtime as needed for sleep or anxiety.  30 tablet  0  . atorvastatin (LIPITOR) 10 MG tablet Take 10 mg by mouth daily.      . calcium carbonate (OS-CAL) 600 MG TABS Take 600 mg by mouth daily.      Marland Kitchen HYDROcodone-acetaminophen (NORCO/VICODIN) 5-325 MG per tablet Take 1 tablet by mouth every 6 (six) hours as needed for pain.      . Lansoprazole (PREVACID PO) Take 1 tablet by mouth daily. OTC      . metoprolol succinate (TOPROL-XL) 25 MG 24 hr tablet Take 1 tablet (25 mg total) by mouth 2 (two) times daily.  60 tablet  6  . promethazine (PHENERGAN) 25 MG tablet Take 25 mg by mouth as needed for nausea.      . traMADol (ULTRAM) 50 MG tablet Take 1 tablet (50 mg total) by mouth every 6 (six) hours as needed for pain.  30 tablet  0  . traZODone (DESYREL)  100 MG tablet Take 100 mg by mouth at bedtime.      Marland Kitchen venlafaxine XR (EFFEXOR-XR) 150 MG 24 hr capsule Take 1 capsule (150 mg total) by mouth daily.  30 capsule  6  . warfarin (COUMADIN) 5 MG tablet Take 1 tablet (5 mg total) by mouth daily. Or as directed by Coumadin Clinic  60 tablet  2   No current facility-administered medications for this visit.   BP 130/84  Pulse 73  Ht 5\' 5"  (1.651 m)  Wt 214 lb (97.07 kg)  BMI 35.61 kg/m2 General: NAD Neck: No JVD, no thyromegaly or thyroid nodule.  Lungs: Occasional rhonchi CV: Nondisplaced PMI.  Heart regular S1/S2, mechanical S1, no S3/S4, no murmur.  No peripheral edema.  No carotid bruit.  Normal pedal pulses.  Abdomen: Soft, nontender, no hepatosplenomegaly, no distention.  Skin: Intact without lesions or rashes.  Neurologic: Alert and oriented x 3.  Psych: Normal affect. Extremities: No clubbing or cyanosis.  HEENT: Normal.   Assessment/Plan: 1. Mechanical mitral valve: Well-seated on post-op echo in 5/14.  She will continue warfarin with goal INR 2.5-3.5.  Ideally, she would take aspirin . However, she has severe abdominal pain/burning even with enteric coated ASA, so she will not  be able to take aspirin.  Patient will need prophylaxis with dental work.  Start cardiac rehab.   2. Dyspnea: Patient has some dyspnea with exertion.  She does not appear volume overloaded on exam.  COPD may be playing a role.   Marca Ancona 08/26/2012

## 2012-09-02 ENCOUNTER — Ambulatory Visit (INDEPENDENT_AMBULATORY_CARE_PROVIDER_SITE_OTHER): Payer: Medicare Other | Admitting: *Deleted

## 2012-09-02 ENCOUNTER — Other Ambulatory Visit: Payer: Self-pay | Admitting: Thoracic Surgery (Cardiothoracic Vascular Surgery)

## 2012-09-02 DIAGNOSIS — Z954 Presence of other heart-valve replacement: Secondary | ICD-10-CM

## 2012-09-02 DIAGNOSIS — I059 Rheumatic mitral valve disease, unspecified: Secondary | ICD-10-CM

## 2012-09-02 LAB — POCT INR: INR: 2.3

## 2012-09-02 NOTE — Telephone Encounter (Signed)
Denied request for Alprazolam refill.

## 2012-09-08 ENCOUNTER — Encounter (HOSPITAL_COMMUNITY)
Admission: RE | Admit: 2012-09-08 | Discharge: 2012-09-08 | Disposition: A | Discharge status: Home / Self Care | Payer: Medicare Other | Source: Ambulatory Visit | Attending: Cardiology | Admitting: Cardiology

## 2012-09-08 NOTE — Progress Notes (Signed)
Cardiac Rehab Medication Review by a Pharmacist  Does the patient  feel that his/her medications are working for him/her?  yes  Has the patient been experiencing any side effects to the medications prescribed?  no  Does the patient measure his/her own blood pressure or blood glucose at home?  yes - BP every morning  Does the patient have any problems obtaining medications due to transportation or finances?   no  Understanding of regimen: excellent Understanding of indications: excellent Potential of compliance: excellent    Pharmacist comments: No issues identified.    Fritschle, Joahan Swatzell 09/08/2012 8:06 AM

## 2012-09-09 ENCOUNTER — Telehealth (HOSPITAL_COMMUNITY): Payer: Self-pay | Admitting: Cardiology

## 2012-09-12 ENCOUNTER — Encounter (HOSPITAL_COMMUNITY): Payer: Medicare Other

## 2012-09-13 ENCOUNTER — Ambulatory Visit (INDEPENDENT_AMBULATORY_CARE_PROVIDER_SITE_OTHER): Payer: Medicare Other | Admitting: *Deleted

## 2012-09-13 DIAGNOSIS — Z954 Presence of other heart-valve replacement: Secondary | ICD-10-CM

## 2012-09-13 DIAGNOSIS — I059 Rheumatic mitral valve disease, unspecified: Secondary | ICD-10-CM

## 2012-09-13 LAB — POCT INR: INR: 2.6

## 2012-09-14 ENCOUNTER — Encounter (HOSPITAL_COMMUNITY): Payer: Medicare Other

## 2012-09-16 ENCOUNTER — Encounter (HOSPITAL_COMMUNITY): Payer: Medicare Other

## 2012-09-19 ENCOUNTER — Encounter (HOSPITAL_COMMUNITY): Payer: Medicare Other

## 2012-09-21 ENCOUNTER — Encounter (HOSPITAL_COMMUNITY): Payer: Medicare Other

## 2012-09-23 ENCOUNTER — Encounter (HOSPITAL_COMMUNITY): Payer: Medicare Other

## 2012-09-26 ENCOUNTER — Encounter (HOSPITAL_COMMUNITY): Payer: Medicare Other

## 2012-09-27 ENCOUNTER — Ambulatory Visit (INDEPENDENT_AMBULATORY_CARE_PROVIDER_SITE_OTHER): Payer: Medicare Other | Admitting: *Deleted

## 2012-09-27 DIAGNOSIS — Z954 Presence of other heart-valve replacement: Secondary | ICD-10-CM

## 2012-09-27 DIAGNOSIS — I059 Rheumatic mitral valve disease, unspecified: Secondary | ICD-10-CM

## 2012-09-28 ENCOUNTER — Encounter (HOSPITAL_COMMUNITY): Payer: Medicare Other

## 2012-09-30 ENCOUNTER — Encounter (HOSPITAL_COMMUNITY): Payer: Medicare Other

## 2012-10-03 ENCOUNTER — Encounter (HOSPITAL_COMMUNITY): Payer: Medicare Other

## 2012-10-05 ENCOUNTER — Encounter (HOSPITAL_COMMUNITY): Payer: Medicare Other

## 2012-10-10 ENCOUNTER — Ambulatory Visit (INDEPENDENT_AMBULATORY_CARE_PROVIDER_SITE_OTHER): Payer: Medicare Other | Admitting: *Deleted

## 2012-10-10 ENCOUNTER — Encounter (HOSPITAL_COMMUNITY): Payer: Medicare Other

## 2012-10-10 DIAGNOSIS — Z954 Presence of other heart-valve replacement: Secondary | ICD-10-CM

## 2012-10-10 DIAGNOSIS — I059 Rheumatic mitral valve disease, unspecified: Secondary | ICD-10-CM

## 2012-10-12 ENCOUNTER — Encounter (HOSPITAL_COMMUNITY): Payer: Medicare Other

## 2012-10-14 ENCOUNTER — Encounter (HOSPITAL_COMMUNITY): Payer: Medicare Other

## 2012-10-17 ENCOUNTER — Encounter (HOSPITAL_COMMUNITY): Payer: Medicare Other

## 2012-10-19 ENCOUNTER — Encounter (HOSPITAL_COMMUNITY): Payer: Medicare Other

## 2012-10-21 ENCOUNTER — Encounter (HOSPITAL_COMMUNITY): Payer: Medicare Other

## 2012-10-24 ENCOUNTER — Encounter (HOSPITAL_COMMUNITY): Payer: Medicare Other

## 2012-10-24 ENCOUNTER — Ambulatory Visit (INDEPENDENT_AMBULATORY_CARE_PROVIDER_SITE_OTHER): Payer: Medicare Other | Admitting: Pharmacist

## 2012-10-24 DIAGNOSIS — Z954 Presence of other heart-valve replacement: Secondary | ICD-10-CM

## 2012-10-24 DIAGNOSIS — I059 Rheumatic mitral valve disease, unspecified: Secondary | ICD-10-CM

## 2012-10-24 LAB — POCT INR: INR: 3.4

## 2012-10-26 ENCOUNTER — Encounter (HOSPITAL_COMMUNITY): Payer: Medicare Other

## 2012-10-26 ENCOUNTER — Other Ambulatory Visit: Payer: Self-pay | Admitting: Cardiology

## 2012-10-27 ENCOUNTER — Telehealth: Payer: Self-pay | Admitting: *Deleted

## 2012-10-27 NOTE — Telephone Encounter (Signed)
Calling patient to verify if she still takes Potassium. She states she no longer takes Potassium as requested by Dr Shirlee Latch and Norma Fredrickson as of 07/26/12.  I called [facility] in [place] to let them know that patient is no longer taking Potassium and this was verified verbally with the patient. They said ok and to deny the current Rx Request.   Micki Riley, CMA

## 2012-10-27 NOTE — Telephone Encounter (Signed)
CALLED OT AND LEFT MSG FOR HER TO CALL us BC WE RECEIVED A REFILL REQUEST FOR POTASSIUM AND IT IS NOT ON HER MED LIST.

## 2012-10-28 ENCOUNTER — Encounter (HOSPITAL_COMMUNITY): Payer: Medicare Other

## 2012-10-31 ENCOUNTER — Encounter (HOSPITAL_COMMUNITY): Payer: Medicare Other

## 2012-11-02 ENCOUNTER — Encounter (HOSPITAL_COMMUNITY): Payer: Medicare Other

## 2012-11-04 ENCOUNTER — Encounter (HOSPITAL_COMMUNITY): Payer: Medicare Other

## 2012-11-07 ENCOUNTER — Encounter (HOSPITAL_COMMUNITY): Payer: Medicare Other

## 2012-11-09 ENCOUNTER — Encounter (HOSPITAL_COMMUNITY): Payer: Medicare Other

## 2012-11-11 ENCOUNTER — Encounter (HOSPITAL_COMMUNITY): Payer: Medicare Other

## 2012-11-14 ENCOUNTER — Ambulatory Visit (INDEPENDENT_AMBULATORY_CARE_PROVIDER_SITE_OTHER): Payer: Medicare Other | Admitting: Thoracic Surgery (Cardiothoracic Vascular Surgery)

## 2012-11-14 ENCOUNTER — Encounter: Payer: Self-pay | Admitting: Thoracic Surgery (Cardiothoracic Vascular Surgery)

## 2012-11-14 ENCOUNTER — Encounter (HOSPITAL_COMMUNITY): Payer: Medicare Other

## 2012-11-14 VITALS — BP 130/80 | HR 76 | Resp 20 | Ht 65.0 in | Wt 217.0 lb

## 2012-11-14 DIAGNOSIS — Z954 Presence of other heart-valve replacement: Secondary | ICD-10-CM

## 2012-11-14 DIAGNOSIS — I340 Nonrheumatic mitral (valve) insufficiency: Secondary | ICD-10-CM

## 2012-11-14 DIAGNOSIS — I059 Rheumatic mitral valve disease, unspecified: Secondary | ICD-10-CM

## 2012-11-14 NOTE — Patient Instructions (Signed)
  Endocarditis is a potentially serious infection of heart valves or inside lining of the heart.  It occurs more commonly in patients with diseased heart valves (such as patient's with aortic or mitral valve disease) and in patients who have undergone heart valve repair or replacement.  Certain surgical and dental procedures may put you at risk, such as dental cleaning, other dental procedures, or any surgery involving the respiratory, urinary, gastrointestinal tract, gallbladder or prostate gland.   To minimize your chances for develooping endocarditis, maintain good oral health and seek prompt medical attention for any infections involving the mouth, teeth, gums, skin or urinary tract.  Always notify your doctor or dentist about your underlying heart valve condition before having any invasive procedures. You will need to take antibiotics before certain procedures.       The patient is encouraged to exercise, watch her diet, and make an effort to lose some weight.

## 2012-11-14 NOTE — Progress Notes (Signed)
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 Her postoperative recovery has been uncomplicated. She was last seen here in the office on 08/08/2012. Since then she has been seen in followup by Dr. Shirlee Latch and a followup echocardiogram was performed demonstrating normal left ventricular function with normal functioning mechanical prosthesis in the mitral position. The patient returns for routine followup today. She reports that she's doing quite well. She no longer has any significant soreness in her chest. Her exercise tolerance is quite good and she notes that her breathing is much better than it was prior to surgery. She never did participate in the cardiac rehabilitation program because she states that she could not work up her schedule. At present she is getting along quite well and she notes that she is exercising some. She has not had any particular problems with Coumadin therapy. Overall she is delighted with her progress.   Current Outpatient Prescriptions  Medication Sig Dispense Refill  . ALPRAZolam (XANAX) 0.25 MG tablet Take 1 tablet (0.25 mg total) by mouth at bedtime as needed for sleep or anxiety.  30 tablet  0  . atorvastatin (LIPITOR) 10 MG tablet Take 10 mg by mouth daily.      . calcium carbonate (OS-CAL) 600 MG TABS Take 600 mg by mouth 2 (two) times daily with a meal.       . HYDROcodone-acetaminophen (NORCO/VICODIN) 5-325 MG per tablet Take 1 tablet by mouth every 6 (six) hours as needed for pain.      . Lansoprazole (PREVACID PO) Take 1 tablet by mouth daily. OTC      . metoprolol succinate (TOPROL-XL) 25 MG 24 hr tablet Take 1 tablet (25 mg total) by mouth 2 (two) times  daily.  60 tablet  6  . promethazine (PHENERGAN) 25 MG tablet Take 25 mg by mouth as needed for nausea.      . traMADol (ULTRAM) 50 MG tablet Take 1 tablet (50 mg total) by mouth every 6 (six) hours as needed for pain.  30 tablet  0  . traZODone (DESYREL) 100 MG tablet Take 100 mg by mouth at bedtime.      Marland Kitchen venlafaxine XR (EFFEXOR-XR) 150 MG 24 hr capsule Take 1 capsule (150 mg total) by mouth daily.  30 capsule  6  . warfarin (COUMADIN) 5 MG tablet Take 1 tablet (5 mg total) by mouth daily. Or as directed by Coumadin Clinic  60 tablet  2   No current facility-administered medications for this visit.      Physical Exam:   There were no vitals taken for this visit.  General:  Well-appearing  Chest:   Clear to auscultation with symmetrical breath sounds  CV:   Regular rate and rhythm with mechanical heart sounds  Incisions:  Completely healed with stable sternum  Abdomen:  Soft nontender  Extremities:  Warm and well-perfused   Diagnostic Tests:  Transthoracic Echocardiography  Patient: Priscilla Stevens, Priscilla Stevens MR #: [id number] Study Date: 08/19/2012 Gender: F Age: 62 Height: 165.1cm Weight: 96.6kg BSA: 2.68m^2 Pt. Status: Room:  ATTENDING Willa Rough Darin Engels, [place] SONOGRAPHER Aida Raider, RDCS  PERFORMING [facility] ORDERING Norma Fredrickson cc:  ------------------------------------------------------------ LV EF: 55% - 60%  ------------------------------------------------------------ Indications: 424.0 Mitral valve disease.  ------------------------------------------------------------ History: PMH: Acquired from the patient and from the patient's chart. PMH: CHF. Pulmonary Edema. Fibromyalgia. Risk factors: Former tobacco use.  ------------------------------------------------------------ Study Conclusions  - Left ventricle: The cavity size was normal. Wall thickness was increased in a pattern of mild LVH. Systolic function was normal. The  estimated ejection fraction was in the range of 55% to 60%. - Aortic valve: Trivial regurgitation. - Mitral valve: MV prosthesis is well seated. Peak and mean gradients across valve are 9 and 6 mm Hg respectively which is normal. Valve area by pressure half-time: 2.29cm^2. Valve area by continuity equation (using LVOT flow): 1.82cm^2. - Left atrium: The atrium was mildly dilated.  ------------------------------------------------------------ Labs, prior tests, procedures, and surgery: Valve surgery (July 07, 2012). Mitral valve replacement with a mechanical valve.  Transthoracic echocardiography. M-mode, complete 2D, spectral Doppler, and color Doppler. Height: Height: 165.1cm. Height: 65in. Weight: Weight: 96.6kg. Weight: 212.6lb. Body mass index: BMI: 35.4kg/m^2. Body surface area: BSA: 2.84m^2. Blood pressure: 118/69. Patient status: Outpatient. Location: [place] [facility]  ------------------------------------------------------------  ------------------------------------------------------------ Left ventricle: The cavity size was normal. Wall thickness was increased in a pattern of mild LVH. Systolic function was normal. The estimated ejection fraction was in the range of 55% to 60%.  ------------------------------------------------------------ Aortic valve: Structurally normal valve. Cusp separation was normal. Doppler: Transvalvular velocity was within the normal range. There was no stenosis. Trivial regurgitation.  ------------------------------------------------------------ Mitral valve: MV prosthesis is well seated. Peak and mean gradients across valve are 9 and 6 mm Hg respectively which is normal. Doppler: No significant regurgitation. Valve area by pressure half-time: 2.29cm^2. Indexed valve area by pressure half-time: 1.13cm^2/m^2. Valve area by continuity equation (using LVOT flow): 1.82cm^2. Indexed valve area by continuity equation (using LVOT  flow): 0.9cm^2/m^2. Mean gradient: 6mm Hg (D). Peak gradient: 9mm Hg (D).  ------------------------------------------------------------ Left atrium: The atrium was mildly dilated.  ------------------------------------------------------------ Right ventricle: The cavity size was normal. Wall thickness was normal. Systolic function was normal.  ------------------------------------------------------------ Pulmonic valve: Poorly visualized.  ------------------------------------------------------------ Tricuspid valve: Structurally normal valve. Leaflet separation was normal. Doppler: Transvalvular velocity was within the normal range. Trivial regurgitation.  ------------------------------------------------------------ Right atrium: The atrium was normal in size.  ------------------------------------------------------------ Pericardium: There was no pericardial effusion.  ------------------------------------------------------------ Systemic veins: Inferior vena cava: The vessel was normal in size; the respirophasic diameter changes were in the normal range (= 50%); findings are consistent with normal central venous pressure.  ------------------------------------------------------------  2D measurements Normal Doppler measurements Normal Left ventricle Left ventricle LVID ED, 45 mm 43-52 Ea, lat 8.77 cm/s ------ chord, ann, tiss PLAX DP LVID ES, 36.6 mm 23-38 E/Ea, lat 18.1 ------ chord, ann, tiss 3 PLAX DP FS, chord, 19 % >29 Ea, med 6.36 cm/s ------ PLAX ann, tiss LVPW, ED 13.8 mm ------ DP IVS/LVPW 0.91 <1.3 E/Ea, med 25 ------ ratio, ED ann, tiss Ventricular septum DP IVS, ED 12.5 mm ------ LVOT LVOT Peak vel, 106 cm/s ------ Diam, S 19 mm ------ S Area 2.84 cm^2 ------ VTI, S 23.6 cm ------ Diam 19 mm ------ Stroke vol 66.9 ml ------ Aorta Stroke 33 ml/m^2 ------ Root diam, 31 mm ------ index ED Mitral valve Left atrium Peak E vel 159 cm/s ------ AP dim 45 mm  ------ Peak A vel 136 cm/s ------ AP dim 2.22 cm/m^2 <2.2 Mean vel, 123 cm/s ------ index D Decelerati 282 ms 150-23 on  time 0 Pressure 96 ms ------ half-time Mean 6 mm Hg ------ gradient, D Peak 9 mm Hg ------ gradient, D Peak E/A 1.2 ------ ratio Area (PHT) 2.29 cm^2 ------ Area index 1.13 cm^2/m ------ (PHT) ^2 Area 1.82 cm^2 ------ (LVOT) continuity Area index 0.9 cm^2/m ------ (LVOT ^2 cont) Annulus 36.8 cm ------ VTI Right ventricle Sa vel, 10.2 cm/s ------ lat ann, tiss DP  ------------------------------------------------------------ Prepared and Electronically Authenticated by  Dietrich Pates [id number]:[id number]    Impression:  Patient is doing very well more than 4 months following 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  Plan:  In the future the patient will call and return to see [place] as needed. She has been reminded regarding the need for long-term dental prophylaxis. She has also been advised to make an effort to continue to exercise and try to lose some weight.   Salvatore Decent. Cornelius Moras, MD 11/14/2012 1:15 PM

## 2012-11-16 ENCOUNTER — Encounter (HOSPITAL_COMMUNITY): Payer: Medicare Other

## 2012-11-18 ENCOUNTER — Encounter (HOSPITAL_COMMUNITY): Payer: Medicare Other

## 2012-11-21 ENCOUNTER — Encounter (HOSPITAL_COMMUNITY): Payer: Medicare Other

## 2012-11-22 ENCOUNTER — Ambulatory Visit (INDEPENDENT_AMBULATORY_CARE_PROVIDER_SITE_OTHER): Payer: Medicare Other | Admitting: Cardiology

## 2012-11-22 ENCOUNTER — Encounter: Payer: Self-pay | Admitting: Cardiology

## 2012-11-22 ENCOUNTER — Ambulatory Visit (INDEPENDENT_AMBULATORY_CARE_PROVIDER_SITE_OTHER): Payer: Medicare Other | Admitting: *Deleted

## 2012-11-22 DIAGNOSIS — I059 Rheumatic mitral valve disease, unspecified: Secondary | ICD-10-CM

## 2012-11-22 DIAGNOSIS — Z954 Presence of other heart-valve replacement: Secondary | ICD-10-CM

## 2012-11-22 MED ORDER — WARFARIN SODIUM 5 MG PO TABS: 5.0000 mg | ORAL_TABLET | Freq: Every day | ORAL | Status: DC

## 2012-11-22 NOTE — Patient Instructions (Addendum)
**Note De-identified  Obfuscation** Your physician recommends that you continue on your current medications as directed. Please refer to the Current Medication list given to you today. Your physician wants you to follow-up in: 4 months You will receive a reminder letter in the mail two months in advance. If you don't receive a letter, please call our office to schedule the follow-up appointment.  

## 2012-11-23 ENCOUNTER — Encounter (HOSPITAL_COMMUNITY): Payer: Medicare Other

## 2012-11-23 NOTE — Progress Notes (Signed)
Patient ID: Priscilla Stevens, female   DOB: [birth year], 62 y.o.   MRN: [id number] PCP: Dr. Cyndia Bent  62 yo with history of rheumatic mitral valve disease presents after mitral valve replacement with mehanical MVR in 4/14.  She did not have significant CAD on cath prior to MVR.  Her post-op course was uneventful, no atrial fibrillation.    She is stable currently.  She walks a mile on most days without dyspnea.  She can climb a flight of stairs.  No PND or orthopnea.  No chest pain.  Rare palpitations.  She is not doing cardiac rehab because of her distance from the hospital.   PMH: 1. Depression 2. Rheumatic heart disease: Patient developed moderate MS and moderate to severe MR.  4/14 he had MV replacement with #29 Sorin [employer]omed[employer] mitral valve.  Echo (5/14) with EF 55-60%, mild LVH, mechanical mitral valve with mean gradient 6 mmHg. 3. Morbid obesity 4. CLL 5. COPD 6. Fibromyalgia 7. LHC (3/14) with no significant CAD.   SH: Prior smoker, quit 1/14.  Married.  No ETOH.   FH: No premature CAD   Current Outpatient Prescriptions  Medication Sig Dispense Refill  . ALPRAZolam (XANAX) 0.25 MG tablet Take 1 tablet (0.25 mg total) by mouth at bedtime as needed for sleep or anxiety.  30 tablet  0  . atorvastatin (LIPITOR) 10 MG tablet Take 10 mg by mouth daily.      . calcium carbonate (OS-CAL) 600 MG TABS Take 600 mg by mouth 2 (two) times daily with a meal.       . HYDROcodone-acetaminophen (NORCO/VICODIN) 5-325 MG per tablet Take 1 tablet by mouth every 6 (six) hours as needed for pain.      . Lansoprazole (PREVACID PO) Take 1 tablet by mouth daily. OTC      . metoprolol succinate (TOPROL-XL) 25 MG 24 hr tablet Take 1 tablet (25 mg total) by mouth 2 (two) times daily.  60 tablet  6  . promethazine (PHENERGAN) 25 MG tablet Take 25 mg by mouth as needed for nausea.      . traMADol (ULTRAM) 50 MG tablet Take 1 tablet (50 mg total) by mouth every 6 (six) hours as needed for pain.  30 tablet  0   . traZODone (DESYREL) 100 MG tablet Take 100 mg by mouth at bedtime.      Marland Kitchen venlafaxine XR (EFFEXOR-XR) 150 MG 24 hr capsule Take 1 capsule (150 mg total) by mouth daily.  30 capsule  6  . warfarin (COUMADIN) 5 MG tablet Take 1 tablet (5 mg total) by mouth daily. Or as directed by [facility]madin Clinic  60 tablet  2   No current facility-administered medications for this visit.   BP 112/62  Pulse 97  Ht 5\' 5"  (1.651 m)  Wt 97.977 kg (216 lb)  BMI 35.94 kg/m2  SpO2 96% General: NAD Neck: No JVD, no thyromegaly or thyroid nodule.  Lungs: Occasional rhonchi CV: Nondisplaced PMI.  Heart regular S1/S2, mechanical S1, no S3/S4, no murmur.  No peripheral edema.  No carotid bruit.  Normal pedal pulses.  Abdomen: Soft, nontender, no hepatosplenomegaly, no distention.  Neurologic: Alert and oriented x 3.  Psych: Normal affect. Extremities: No clubbing or cyanosis.   Assessment/Plan: 1. Mechanical mitral valve: Well-seated on post-op echo in 5/14.  She will continue warfarin with goal INR 2.5-3.5.  Ideally, she would take aspirin . However, she has severe abdominal pain/burning even with enteric coated ASA, so she will not be  able to take aspirin.  Patient will need prophylaxis with dental work.  2. Obesity: Continue regular exercise (walking 1 mile/day).    Marca Ancona 11/23/2012

## 2012-11-25 ENCOUNTER — Encounter (HOSPITAL_COMMUNITY): Payer: Medicare Other

## 2012-11-28 ENCOUNTER — Telehealth: Payer: Self-pay | Admitting: *Deleted

## 2012-11-28 ENCOUNTER — Encounter (HOSPITAL_COMMUNITY): Payer: Medicare Other

## 2012-11-28 NOTE — Telephone Encounter (Signed)
Called in refill for coumadin

## 2012-11-30 ENCOUNTER — Encounter (HOSPITAL_COMMUNITY): Payer: Medicare Other

## 2012-12-02 ENCOUNTER — Encounter (HOSPITAL_COMMUNITY): Payer: Medicare Other

## 2012-12-05 ENCOUNTER — Encounter (HOSPITAL_COMMUNITY): Payer: Medicare Other

## 2012-12-07 ENCOUNTER — Encounter (HOSPITAL_COMMUNITY): Payer: Medicare Other

## 2012-12-09 ENCOUNTER — Encounter (HOSPITAL_COMMUNITY): Payer: Medicare Other

## 2012-12-12 ENCOUNTER — Encounter (HOSPITAL_COMMUNITY): Payer: Medicare Other

## 2012-12-14 ENCOUNTER — Encounter (HOSPITAL_COMMUNITY): Payer: Medicare Other

## 2012-12-16 ENCOUNTER — Encounter (HOSPITAL_COMMUNITY): Payer: Medicare Other

## 2012-12-20 ENCOUNTER — Ambulatory Visit (INDEPENDENT_AMBULATORY_CARE_PROVIDER_SITE_OTHER): Payer: Medicare Other | Admitting: Pharmacist

## 2012-12-20 DIAGNOSIS — Z954 Presence of other heart-valve replacement: Secondary | ICD-10-CM

## 2012-12-20 DIAGNOSIS — I059 Rheumatic mitral valve disease, unspecified: Secondary | ICD-10-CM

## 2012-12-20 LAB — POCT INR: INR: 2

## 2013-01-10 ENCOUNTER — Ambulatory Visit (INDEPENDENT_AMBULATORY_CARE_PROVIDER_SITE_OTHER): Payer: Medicare Other | Admitting: General Practice

## 2013-01-10 DIAGNOSIS — I059 Rheumatic mitral valve disease, unspecified: Secondary | ICD-10-CM

## 2013-01-10 DIAGNOSIS — Z954 Presence of other heart-valve replacement: Secondary | ICD-10-CM

## 2013-01-10 LAB — POCT INR: INR: 2.6

## 2013-02-03 ENCOUNTER — Other Ambulatory Visit: Payer: Self-pay | Admitting: Cardiology

## 2013-02-07 ENCOUNTER — Ambulatory Visit (INDEPENDENT_AMBULATORY_CARE_PROVIDER_SITE_OTHER): Payer: Medicare Other | Admitting: General Practice

## 2013-02-07 DIAGNOSIS — I059 Rheumatic mitral valve disease, unspecified: Secondary | ICD-10-CM

## 2013-02-07 DIAGNOSIS — Z954 Presence of other heart-valve replacement: Secondary | ICD-10-CM

## 2013-02-07 LAB — POCT INR: INR: 2.5

## 2013-02-09 ENCOUNTER — Other Ambulatory Visit: Payer: Self-pay

## 2013-02-21 ENCOUNTER — Other Ambulatory Visit: Payer: Self-pay | Admitting: Cardiology

## 2013-03-22 ENCOUNTER — Encounter: Payer: Self-pay | Admitting: Cardiology

## 2013-03-22 ENCOUNTER — Ambulatory Visit (INDEPENDENT_AMBULATORY_CARE_PROVIDER_SITE_OTHER): Payer: Medicare Other | Admitting: Cardiology

## 2013-03-22 ENCOUNTER — Ambulatory Visit (INDEPENDENT_AMBULATORY_CARE_PROVIDER_SITE_OTHER): Payer: Medicare Other | Admitting: *Deleted

## 2013-03-22 VITALS — BP 126/68 | HR 83 | Ht 65.0 in | Wt 221.0 lb

## 2013-03-22 DIAGNOSIS — R9431 Abnormal electrocardiogram [ECG] [EKG]: Secondary | ICD-10-CM | POA: Insufficient documentation

## 2013-03-22 DIAGNOSIS — Z954 Presence of other heart-valve replacement: Secondary | ICD-10-CM

## 2013-03-22 DIAGNOSIS — I059 Rheumatic mitral valve disease, unspecified: Secondary | ICD-10-CM

## 2013-03-22 LAB — BASIC METABOLIC PANEL
CO2: 27 mEq/L (ref 19–32)
Glucose, Bld: 96 mg/dL (ref 70–99)
Potassium: 4.6 mEq/L (ref 3.5–5.1)
Sodium: 142 mEq/L (ref 135–145)

## 2013-03-22 LAB — MAGNESIUM: Magnesium: 2.2 mg/dL (ref 1.5–2.5)

## 2013-03-22 MED ORDER — VENLAFAXINE HCL 75 MG PO TABS: 75.0000 mg | ORAL_TABLET | Freq: Every day | ORAL | Status: DC

## 2013-03-22 NOTE — Progress Notes (Signed)
Patient ID: Priscilla Stevens, female   DOB: [birth year], 62 y.o.   MRN: [id number] PCP: Dr. Cyndia Bent  62 yo with history of rheumatic mitral valve disease presents after mitral valve replacement with mehanical MVR in 4/14.  She did not have significant CAD on cath prior to MVR.  Her post-op course was uneventful, no atrial fibrillation.    She is stable currently.  No exertional dyspnea.  She can climb a flight of stairs.  No PND or orthopnea. No chest pain.  Rare palpitations.  No syncope.  QT interval was prolonged on today's ECG.    ECG: NSR, QTc 497 msec  PMH: 1. Depression 2. Rheumatic heart disease: Patient developed moderate MS and moderate to severe MR.  4/14 he had MV replacement with #29 Sorin [employer]omed[employer] mitral valve.  Echo (5/14) with EF 55-60%, mild LVH, mechanical mitral valve with mean gradient 6 mmHg. 3. Morbid obesity 4. CLL 5. COPD 6. Fibromyalgia 7. LHC (3/14) with no significant CAD.   SH: Prior smoker, quit 1/14.  Married.  No ETOH.   FH: No premature CAD  ROS: All systems reviewed and negative except as per HPI.    Current Outpatient Prescriptions  Medication Sig Dispense Refill  . ALPRAZolam (XANAX) 0.25 MG tablet Take 1 tablet (0.25 mg total) by mouth at bedtime as needed for sleep or anxiety.  30 tablet  0  . atorvastatin (LIPITOR) 10 MG tablet Take 10 mg by mouth daily.      . calcium carbonate (OS-CAL) 600 MG TABS Take 600 mg by mouth 2 (two) times daily with a meal.       . HYDROcodone-acetaminophen (NORCO/VICODIN) 5-325 MG per tablet Take 1 tablet by mouth every 6 (six) hours as needed for pain.      . Lansoprazole (PREVACID PO) Take 1 tablet by mouth daily. OTC      . metoprolol succinate (TOPROL-XL) 25 MG 24 hr tablet Take 1 tablet (25 mg total) by mouth 2 (two) times daily.  60 tablet  6  . promethazine (PHENERGAN) 25 MG tablet Take 25 mg by mouth as needed for nausea.      . traMADol (ULTRAM) 50 MG tablet Take 1 tablet (50 mg total) by mouth every 6  (six) hours as needed for pain.  30 tablet  0  . warfarin (COUMADIN) 5 MG tablet TAKE AS DIRECTED BY COUMADIN CLINIC  90 tablet  1  . venlafaxine (EFFEXOR) 75 MG tablet Take 1 tablet (75 mg total) by mouth daily.  30 tablet  6   No current facility-administered medications for this visit.   BP 126/68  Pulse 83  Ht 5\' 5"  (1.651 m)  Wt 100.245 kg (221 lb)  BMI 36.78 kg/m2 General: NAD Neck: No JVD, no thyromegaly or thyroid nodule.  Lungs: Occasional rhonchi CV: Nondisplaced PMI.  Heart regular S1/S2, mechanical S1, no S3/S4, no murmur.  No peripheral edema.  No carotid bruit.  Normal pedal pulses.  Abdomen: Soft, nontender, no hepatosplenomegaly, no distention.  Neurologic: Alert and oriented x 3.  Psych: Normal affect. Extremities: No clubbing or cyanosis.   Assessment/Plan: 1. Mechanical mitral valve: Well-seated on post-op echo in 5/14.  She will continue warfarin with goal INR 2.5-3.5.  Ideally, she would take aspirin . However, she has severe abdominal pain/burning even with enteric coated ASA, so she will not be able to take aspirin.  Patient will need antibiotic prophylaxis with dental work.  2. Obesity: Continue regular exercise.  3. Prolonged QT interval:  She is on Effexor and trazodone, both of which can prolong QT interval.  She says she really does not need the trazodone (given for sleep) so will have her stop it.  She also wants to try to come off the Effexor.  I will have her decrease this from 150 mg daily to 75 mg daily.  I will also check K and Mg levels.  Repeat ECG with these changes.   Marca Ancona 03/22/2013    Marca Ancona 03/22/2013

## 2013-03-22 NOTE — Patient Instructions (Signed)
Stop trazodone.  Decrease Effexor to 75mg  daily.  Your physician recommends that you return for lab work today--BMET/Magnesium level.  Your physician wants you to follow-up in: 6 months with Dr Shirlee Latch. (June 2015). You will receive a reminder letter in the mail two months in advance. If you don't receive a letter, please call our office to schedule the follow-up appointment.

## 2013-03-23 ENCOUNTER — Other Ambulatory Visit: Payer: Self-pay | Admitting: *Deleted

## 2013-03-23 DIAGNOSIS — I059 Rheumatic mitral valve disease, unspecified: Secondary | ICD-10-CM

## 2013-03-24 ENCOUNTER — Other Ambulatory Visit: Payer: Self-pay | Admitting: Cardiology

## 2013-03-27 ENCOUNTER — Telehealth: Payer: Self-pay | Admitting: Cardiology

## 2013-03-27 NOTE — Telephone Encounter (Signed)
Pt aware or normal lab results

## 2013-03-27 NOTE — Telephone Encounter (Signed)
Follow Up  ° °Pt returned call//SR  °

## 2013-04-12 ENCOUNTER — Ambulatory Visit (INDEPENDENT_AMBULATORY_CARE_PROVIDER_SITE_OTHER): Payer: Medicare HMO | Admitting: *Deleted

## 2013-04-12 ENCOUNTER — Ambulatory Visit (INDEPENDENT_AMBULATORY_CARE_PROVIDER_SITE_OTHER): Payer: Medicare HMO

## 2013-04-12 VITALS — BP 104/86 | HR 80 | Ht 65.0 in | Wt 219.5 lb

## 2013-04-12 DIAGNOSIS — R9431 Abnormal electrocardiogram [ECG] [EKG]: Secondary | ICD-10-CM

## 2013-04-12 DIAGNOSIS — I059 Rheumatic mitral valve disease, unspecified: Secondary | ICD-10-CM

## 2013-04-12 DIAGNOSIS — Z954 Presence of other heart-valve replacement: Secondary | ICD-10-CM

## 2013-04-12 LAB — POCT INR: INR: 2.3

## 2013-04-12 NOTE — Progress Notes (Signed)
Patient came to office for a EKG.EKG was done and shown to Dr.McLean.

## 2013-04-27 ENCOUNTER — Ambulatory Visit (INDEPENDENT_AMBULATORY_CARE_PROVIDER_SITE_OTHER): Payer: Medicare HMO

## 2013-04-27 DIAGNOSIS — Z954 Presence of other heart-valve replacement: Secondary | ICD-10-CM

## 2013-04-27 DIAGNOSIS — I059 Rheumatic mitral valve disease, unspecified: Secondary | ICD-10-CM

## 2013-04-27 DIAGNOSIS — Z5181 Encounter for therapeutic drug level monitoring: Secondary | ICD-10-CM | POA: Insufficient documentation

## 2013-04-27 LAB — POCT INR: INR: 3

## 2013-05-16 ENCOUNTER — Other Ambulatory Visit: Payer: Self-pay

## 2013-05-16 ENCOUNTER — Other Ambulatory Visit: Payer: Self-pay | Admitting: *Deleted

## 2013-05-16 MED ORDER — METOPROLOL SUCCINATE ER 25 MG PO TB24: ORAL_TABLET | ORAL | Status: DC

## 2013-05-16 MED ORDER — WARFARIN SODIUM 5 MG PO TABS: ORAL_TABLET | ORAL | Status: DC

## 2013-05-18 ENCOUNTER — Ambulatory Visit (INDEPENDENT_AMBULATORY_CARE_PROVIDER_SITE_OTHER): Payer: Medicare HMO | Admitting: Pharmacist

## 2013-05-18 DIAGNOSIS — I059 Rheumatic mitral valve disease, unspecified: Secondary | ICD-10-CM

## 2013-05-18 DIAGNOSIS — Z5181 Encounter for therapeutic drug level monitoring: Secondary | ICD-10-CM

## 2013-05-18 DIAGNOSIS — Z954 Presence of other heart-valve replacement: Secondary | ICD-10-CM

## 2013-05-18 LAB — POCT INR: INR: 2.8

## 2013-06-15 ENCOUNTER — Ambulatory Visit (INDEPENDENT_AMBULATORY_CARE_PROVIDER_SITE_OTHER): Payer: Medicare HMO

## 2013-06-15 DIAGNOSIS — I059 Rheumatic mitral valve disease, unspecified: Secondary | ICD-10-CM

## 2013-06-15 DIAGNOSIS — Z954 Presence of other heart-valve replacement: Secondary | ICD-10-CM

## 2013-06-15 DIAGNOSIS — Z5181 Encounter for therapeutic drug level monitoring: Secondary | ICD-10-CM

## 2013-06-15 LAB — POCT INR: INR: 3.2

## 2013-07-20 ENCOUNTER — Other Ambulatory Visit: Payer: Self-pay

## 2013-07-20 MED ORDER — METOPROLOL SUCCINATE ER 25 MG PO TB24: ORAL_TABLET | ORAL | Status: DC

## 2013-07-27 ENCOUNTER — Ambulatory Visit (INDEPENDENT_AMBULATORY_CARE_PROVIDER_SITE_OTHER): Payer: Medicare HMO

## 2013-07-27 DIAGNOSIS — I059 Rheumatic mitral valve disease, unspecified: Secondary | ICD-10-CM

## 2013-07-27 DIAGNOSIS — Z954 Presence of other heart-valve replacement: Secondary | ICD-10-CM

## 2013-07-27 DIAGNOSIS — Z5181 Encounter for therapeutic drug level monitoring: Secondary | ICD-10-CM

## 2013-07-27 LAB — POCT INR: INR: 4.1

## 2013-08-11 ENCOUNTER — Ambulatory Visit (INDEPENDENT_AMBULATORY_CARE_PROVIDER_SITE_OTHER): Payer: Medicare HMO

## 2013-08-11 DIAGNOSIS — I059 Rheumatic mitral valve disease, unspecified: Secondary | ICD-10-CM

## 2013-08-11 DIAGNOSIS — Z954 Presence of other heart-valve replacement: Secondary | ICD-10-CM

## 2013-08-11 DIAGNOSIS — Z5181 Encounter for therapeutic drug level monitoring: Secondary | ICD-10-CM

## 2013-08-11 LAB — POCT INR: INR: 2.2

## 2013-08-11 MED ORDER — ENOXAPARIN SODIUM 100 MG/ML ~~LOC~~ SOLN: 100.0000 mg | Freq: Two times a day (BID) | SUBCUTANEOUS | Status: DC

## 2013-08-11 NOTE — Patient Instructions (Addendum)
Take your last dosage of Coumadin on 08/11/13. 08/12/13 No Coumadin, No Lovenox. 08/13/13 Start taking Lovenox 100mg  in the am and Lovenox 100mg  in the pm. 08/14/13 Lovenox 100mg  in the am and Lovenox 100mg  in the pm. 08/15/13 Lovenox 100mg  in the am and Lovenox 100mg  in the pm. 08/16/13 Take your last dosage of Lovenox 100mg  in the am, No Lovenox in the pm prior to colonoscopy on 08/17/13.  Once OK with MD to resume Coumadin take an extra 1/2 tablet x 2 doses, then resume previous dosage regimen 1 tablet daily except 1.5 tablets on Mondays, Wednesdays, and Fridays.  Resume Lovenox as well once ok by MD to do so take 100mg  Lovenox every 12 hrs until INR recheck.

## 2013-08-22 ENCOUNTER — Ambulatory Visit (INDEPENDENT_AMBULATORY_CARE_PROVIDER_SITE_OTHER): Payer: Medicare HMO

## 2013-08-22 DIAGNOSIS — Z954 Presence of other heart-valve replacement: Secondary | ICD-10-CM

## 2013-08-22 DIAGNOSIS — Z5181 Encounter for therapeutic drug level monitoring: Secondary | ICD-10-CM

## 2013-08-22 DIAGNOSIS — I059 Rheumatic mitral valve disease, unspecified: Secondary | ICD-10-CM

## 2013-08-22 LAB — POCT INR: INR: 2

## 2013-09-05 ENCOUNTER — Ambulatory Visit (INDEPENDENT_AMBULATORY_CARE_PROVIDER_SITE_OTHER): Payer: Medicare HMO | Admitting: Pharmacist

## 2013-09-05 DIAGNOSIS — I059 Rheumatic mitral valve disease, unspecified: Secondary | ICD-10-CM

## 2013-09-05 DIAGNOSIS — Z5181 Encounter for therapeutic drug level monitoring: Secondary | ICD-10-CM

## 2013-09-05 DIAGNOSIS — Z954 Presence of other heart-valve replacement: Secondary | ICD-10-CM

## 2013-09-05 LAB — POCT INR: INR: 2.9

## 2013-09-28 ENCOUNTER — Encounter: Payer: Self-pay | Admitting: Cardiology

## 2013-09-28 ENCOUNTER — Ambulatory Visit (INDEPENDENT_AMBULATORY_CARE_PROVIDER_SITE_OTHER): Payer: Medicare HMO

## 2013-09-28 ENCOUNTER — Ambulatory Visit (INDEPENDENT_AMBULATORY_CARE_PROVIDER_SITE_OTHER): Payer: Medicare HMO | Admitting: Cardiology

## 2013-09-28 VITALS — BP 122/80 | HR 72 | Ht 65.0 in | Wt 208.0 lb

## 2013-09-28 DIAGNOSIS — I509 Heart failure, unspecified: Secondary | ICD-10-CM

## 2013-09-28 DIAGNOSIS — I340 Nonrheumatic mitral (valve) insufficiency: Secondary | ICD-10-CM

## 2013-09-28 DIAGNOSIS — Z5181 Encounter for therapeutic drug level monitoring: Secondary | ICD-10-CM

## 2013-09-28 DIAGNOSIS — I059 Rheumatic mitral valve disease, unspecified: Secondary | ICD-10-CM

## 2013-09-28 DIAGNOSIS — Z954 Presence of other heart-valve replacement: Secondary | ICD-10-CM

## 2013-09-28 DIAGNOSIS — I050 Rheumatic mitral stenosis: Secondary | ICD-10-CM

## 2013-09-28 DIAGNOSIS — I5032 Chronic diastolic (congestive) heart failure: Secondary | ICD-10-CM

## 2013-09-28 LAB — CBC WITH DIFFERENTIAL/PLATELET
Basophils Absolute: 0 10*3/uL (ref 0.0–0.1)
Basophils Relative: 0.4 % (ref 0.0–3.0)
EOS ABS: 0.1 10*3/uL (ref 0.0–0.7)
Eosinophils Relative: 0.6 % (ref 0.0–5.0)
HCT: 41.4 % (ref 36.0–46.0)
Hemoglobin: 13.9 g/dL (ref 12.0–15.0)
LYMPHS PCT: 32.8 % (ref 12.0–46.0)
Lymphs Abs: 3.5 10*3/uL (ref 0.7–4.0)
MCHC: 33.6 g/dL (ref 30.0–36.0)
MCV: 84.5 fl (ref 78.0–100.0)
Monocytes Absolute: 0.9 10*3/uL (ref 0.1–1.0)
Monocytes Relative: 8.8 % (ref 3.0–12.0)
NEUTROS ABS: 6.2 10*3/uL (ref 1.4–7.7)
Neutrophils Relative %: 57.4 % (ref 43.0–77.0)
Platelets: 239 10*3/uL (ref 150.0–400.0)
RBC: 4.9 Mil/uL (ref 3.87–5.11)
RDW: 14.5 % (ref 11.5–15.5)
WBC: 10.8 10*3/uL — ABNORMAL HIGH (ref 4.0–10.5)

## 2013-09-28 LAB — POCT INR: INR: 3.7

## 2013-09-28 NOTE — Progress Notes (Signed)
Patient ID: Priscilla Stevens, female   DOB: [birth year], 63 y.o.   MRN: [id number] PCP: Dr. Melford Aase  63 yo with history of rheumatic mitral valve disease presents after mitral valve replacement with mechanical MVR in 4/14.  She did not have significant CAD on cath prior to MVR.  Her post-op course was uneventful, no atrial fibrillation.    She is stable currently.  Some dyspnea if she walks outside in the heat but can walk around the grocery store without any trouble.  She can climb a flight of stairs.  No PND or orthopnea. No chest pain.  Rare palpitations.  No syncope.  QT interval normal on today's ECG (cut out trazodone and cut back on Effexor at prior appt).   ECG: NSR, QTc 430 msec  Labs (12/14): K 4.6, creatinine 0.9  PMH: 1. Depression 2. Rheumatic heart disease: Patient developed moderate MS and moderate to severe MR.  4/14 he had MV replacement with #29 Sorin [employer]omed[employer] mitral valve.  Echo (5/14) with EF 55-60%, mild LVH, mechanical mitral valve with mean gradient 6 mmHg. 3. Morbid obesity 4. CLL 5. COPD 6. Fibromyalgia 7. LHC (3/14) with no significant CAD.  8. Medication-related QT prolongation  SH: Prior smoker, quit 1/14.  Married.  No ETOH.   FH: No premature CAD  ROS: All systems reviewed and negative except as per HPI.    Current Outpatient Prescriptions  Medication Sig Dispense Refill  . ALPRAZolam (XANAX) 0.25 MG tablet Take 1 tablet (0.25 mg total) by mouth at bedtime as needed for sleep or anxiety.  30 tablet  0  . atorvastatin (LIPITOR) 10 MG tablet Take 10 mg by mouth daily.      . calcium carbonate (OS-CAL) 600 MG TABS Take 600 mg by mouth 2 (two) times daily with a meal.       . HYDROcodone-acetaminophen (NORCO/VICODIN) 5-325 MG per tablet Take 1 tablet by mouth every 6 (six) hours as needed for pain.      . Lansoprazole (PREVACID PO) Take 1 tablet by mouth daily. OTC      . metoprolol succinate (TOPROL-XL) 25 MG 24 hr tablet TAKE 1 TABLET BY MOUTH TWICE  DAILY  180 tablet  1  . promethazine (PHENERGAN) 25 MG tablet Take 25 mg by mouth as needed for nausea.      . traMADol (ULTRAM) 50 MG tablet Take 1 tablet (50 mg total) by mouth every 6 (six) hours as needed for pain.  30 tablet  0  . venlafaxine (EFFEXOR) 75 MG tablet Take 37.5 mg by mouth daily.      Marland Kitchen warfarin (COUMADIN) 5 MG tablet Take as directed from coumadin clinic 90 day supply  120 tablet  1   No current facility-administered medications for this visit.   BP 122/80  Pulse 72  Ht 5\' 5"  (1.651 m)  Wt 208 lb (94.348 kg)  BMI 34.61 kg/m2  SpO2 97% General: NAD Neck: No JVD, no thyromegaly or thyroid nodule.  Lungs: Occasional rhonchi CV: Nondisplaced PMI.  Heart regular S1/S2, mechanical S1, no S3/S4, no murmur.  No peripheral edema.  No carotid bruit.  Normal pedal pulses.  Abdomen: Soft, nontender, no hepatosplenomegaly, no distention.  Neurologic: Alert and oriented x 3.  Psych: Normal affect. Extremities: No clubbing or cyanosis.   Assessment/Plan: 1. Mechanical mitral valve: Well-seated on post-op echo in 5/14.  She will continue warfarin with goal INR 2.5-3.5.  Ideally, she would take aspirin . However, she has severe abdominal pain/burning even  with enteric coated ASA, so she will not be able to take aspirin.  Patient will need antibiotic prophylaxis with dental work.  2. Obesity: Continue regular exercise, weight is down 13 lbs since last appointment.  3. Prolonged QT interval: Effexor cut back and trazodone stopped.  QT interval normal today.  Suspect prolongation was medication-related.   Loralie Champagne 09/28/2013

## 2013-09-28 NOTE — Patient Instructions (Addendum)
Your physician recommends that you continue on your current medications as directed. Please refer to the Current Medication list given to you today.  Your physician recommends that you return for lab work in: TODAY (CBC)  Your physician wants you to follow-up in: [place] will receive a reminder letter in the mail two months in advance. If you don't receive a letter, please call our office to schedule the follow-up appointment.

## 2013-10-02 ENCOUNTER — Telehealth: Payer: Self-pay | Admitting: Cardiology

## 2013-10-02 NOTE — Telephone Encounter (Signed)
Spoke with patient about recent lab results 

## 2013-10-02 NOTE — Telephone Encounter (Signed)
Patient is returning your call regarding lab results. Please call back.

## 2013-10-19 ENCOUNTER — Ambulatory Visit (INDEPENDENT_AMBULATORY_CARE_PROVIDER_SITE_OTHER): Payer: Medicare HMO | Admitting: *Deleted

## 2013-10-19 DIAGNOSIS — I059 Rheumatic mitral valve disease, unspecified: Secondary | ICD-10-CM

## 2013-10-19 DIAGNOSIS — Z5181 Encounter for therapeutic drug level monitoring: Secondary | ICD-10-CM

## 2013-10-19 DIAGNOSIS — Z954 Presence of other heart-valve replacement: Secondary | ICD-10-CM

## 2013-10-19 LAB — POCT INR: INR: 3.1

## 2013-10-19 MED ORDER — ENOXAPARIN SODIUM 100 MG/ML ~~LOC~~ SOLN: 100.0000 mg | Freq: Two times a day (BID) | SUBCUTANEOUS | Status: DC

## 2013-10-19 NOTE — Patient Instructions (Signed)
10/21/2013 last day to take coumadin 10/22/2013 no coumadin no Lovenox 10/23/2013 no coumadin Lovenox 100 mg 8am and Lovenox 100 mg 8pm 10/24/2013 no coumadin Lovenox 100 mg 8am and Lovenox 100 mg 8pm 10/25/2013 no coumadin lovenox 100 mg 8am and lovenox100 mg 8pm 10/26/2013 no coumadin Lovenox 100 mg 8am only no Lovenox in the pm 10/27/2013 no coumadin no Lovenox day of procedure then after procedure ask MD doing procedure when to restart Coumadin and Lovenox and restart at same dose Lovenox 100 mg bid and Coumadin 5mg  daily except 7.5mg  on Mondays and Wednesdays and take extra 1/2 tablet of coumadin for 2 days and continue Lovenox and Coumadin until seen in clinic on July 29th

## 2013-10-30 ENCOUNTER — Other Ambulatory Visit: Payer: Self-pay

## 2013-11-03 ENCOUNTER — Other Ambulatory Visit: Payer: Self-pay | Admitting: *Deleted

## 2013-11-03 ENCOUNTER — Ambulatory Visit (INDEPENDENT_AMBULATORY_CARE_PROVIDER_SITE_OTHER): Payer: Medicare HMO | Admitting: *Deleted

## 2013-11-03 DIAGNOSIS — Z954 Presence of other heart-valve replacement: Secondary | ICD-10-CM

## 2013-11-03 DIAGNOSIS — I059 Rheumatic mitral valve disease, unspecified: Secondary | ICD-10-CM

## 2013-11-03 DIAGNOSIS — Z5181 Encounter for therapeutic drug level monitoring: Secondary | ICD-10-CM

## 2013-11-03 LAB — POCT INR: INR: 1.9

## 2013-11-03 MED ORDER — METOPROLOL SUCCINATE ER 25 MG PO TB24: ORAL_TABLET | ORAL | Status: DC

## 2013-11-03 MED ORDER — WARFARIN SODIUM 5 MG PO TABS: ORAL_TABLET | ORAL | Status: DC

## 2013-11-09 ENCOUNTER — Ambulatory Visit (INDEPENDENT_AMBULATORY_CARE_PROVIDER_SITE_OTHER): Payer: Medicare HMO | Admitting: Pharmacist

## 2013-11-09 DIAGNOSIS — Z5181 Encounter for therapeutic drug level monitoring: Secondary | ICD-10-CM

## 2013-11-09 DIAGNOSIS — I059 Rheumatic mitral valve disease, unspecified: Secondary | ICD-10-CM

## 2013-11-09 DIAGNOSIS — Z954 Presence of other heart-valve replacement: Secondary | ICD-10-CM

## 2013-11-09 LAB — POCT INR: INR: 2.5

## 2013-11-23 ENCOUNTER — Ambulatory Visit (INDEPENDENT_AMBULATORY_CARE_PROVIDER_SITE_OTHER): Payer: Medicare HMO

## 2013-11-23 DIAGNOSIS — Z5181 Encounter for therapeutic drug level monitoring: Secondary | ICD-10-CM

## 2013-11-23 DIAGNOSIS — I059 Rheumatic mitral valve disease, unspecified: Secondary | ICD-10-CM

## 2013-11-23 DIAGNOSIS — Z954 Presence of other heart-valve replacement: Secondary | ICD-10-CM

## 2013-11-23 LAB — POCT INR: INR: 2.1

## 2013-12-07 ENCOUNTER — Ambulatory Visit (INDEPENDENT_AMBULATORY_CARE_PROVIDER_SITE_OTHER): Payer: Medicare HMO

## 2013-12-07 DIAGNOSIS — Z5181 Encounter for therapeutic drug level monitoring: Secondary | ICD-10-CM

## 2013-12-07 DIAGNOSIS — Z954 Presence of other heart-valve replacement: Secondary | ICD-10-CM

## 2013-12-07 DIAGNOSIS — I059 Rheumatic mitral valve disease, unspecified: Secondary | ICD-10-CM

## 2013-12-07 LAB — POCT INR: INR: 3.4

## 2013-12-28 ENCOUNTER — Ambulatory Visit (INDEPENDENT_AMBULATORY_CARE_PROVIDER_SITE_OTHER): Payer: Medicare HMO

## 2013-12-28 DIAGNOSIS — Z954 Presence of other heart-valve replacement: Secondary | ICD-10-CM

## 2013-12-28 DIAGNOSIS — Z5181 Encounter for therapeutic drug level monitoring: Secondary | ICD-10-CM

## 2013-12-28 DIAGNOSIS — I059 Rheumatic mitral valve disease, unspecified: Secondary | ICD-10-CM

## 2013-12-28 LAB — POCT INR: INR: 3.5

## 2014-01-11 ENCOUNTER — Other Ambulatory Visit: Payer: Self-pay | Admitting: Cardiology

## 2014-01-25 ENCOUNTER — Ambulatory Visit (INDEPENDENT_AMBULATORY_CARE_PROVIDER_SITE_OTHER): Payer: Medicare HMO | Admitting: *Deleted

## 2014-01-25 DIAGNOSIS — I059 Rheumatic mitral valve disease, unspecified: Secondary | ICD-10-CM

## 2014-01-25 DIAGNOSIS — Z5181 Encounter for therapeutic drug level monitoring: Secondary | ICD-10-CM

## 2014-01-25 DIAGNOSIS — Z954 Presence of other heart-valve replacement: Secondary | ICD-10-CM

## 2014-01-25 LAB — POCT INR: INR: 3

## 2014-02-22 ENCOUNTER — Ambulatory Visit (INDEPENDENT_AMBULATORY_CARE_PROVIDER_SITE_OTHER): Payer: Medicare HMO | Admitting: Surgery

## 2014-02-22 DIAGNOSIS — Z5181 Encounter for therapeutic drug level monitoring: Secondary | ICD-10-CM

## 2014-02-22 DIAGNOSIS — I059 Rheumatic mitral valve disease, unspecified: Secondary | ICD-10-CM

## 2014-02-22 DIAGNOSIS — Z954 Presence of other heart-valve replacement: Secondary | ICD-10-CM

## 2014-02-22 LAB — POCT INR: INR: 3.3

## 2014-03-15 ENCOUNTER — Encounter (HOSPITAL_COMMUNITY): Payer: Self-pay | Admitting: Cardiology

## 2014-03-21 ENCOUNTER — Encounter: Payer: Self-pay | Admitting: Cardiology

## 2014-03-21 ENCOUNTER — Ambulatory Visit (INDEPENDENT_AMBULATORY_CARE_PROVIDER_SITE_OTHER): Payer: Medicare HMO | Admitting: Cardiology

## 2014-03-21 ENCOUNTER — Ambulatory Visit (INDEPENDENT_AMBULATORY_CARE_PROVIDER_SITE_OTHER): Payer: Medicare HMO | Admitting: *Deleted

## 2014-03-21 VITALS — BP 108/62 | HR 88 | Ht 65.0 in | Wt 203.0 lb

## 2014-03-21 DIAGNOSIS — I059 Rheumatic mitral valve disease, unspecified: Secondary | ICD-10-CM

## 2014-03-21 DIAGNOSIS — Z5181 Encounter for therapeutic drug level monitoring: Secondary | ICD-10-CM

## 2014-03-21 DIAGNOSIS — Z954 Presence of other heart-valve replacement: Secondary | ICD-10-CM

## 2014-03-21 LAB — CBC WITH DIFFERENTIAL/PLATELET
BASOS PCT: 0.5 % (ref 0.0–3.0)
Basophils Absolute: 0.1 10*3/uL (ref 0.0–0.1)
EOS PCT: 0.7 % (ref 0.0–5.0)
Eosinophils Absolute: 0.1 10*3/uL (ref 0.0–0.7)
HCT: 38.4 % (ref 36.0–46.0)
Hemoglobin: 12.4 g/dL (ref 12.0–15.0)
LYMPHS PCT: 32.6 % (ref 12.0–46.0)
Lymphs Abs: 4.1 10*3/uL — ABNORMAL HIGH (ref 0.7–4.0)
MCHC: 32.3 g/dL (ref 30.0–36.0)
MCV: 75.7 fl — ABNORMAL LOW (ref 78.0–100.0)
MONOS PCT: 9.3 % (ref 3.0–12.0)
Monocytes Absolute: 1.2 10*3/uL — ABNORMAL HIGH (ref 0.1–1.0)
NEUTROS PCT: 56.9 % (ref 43.0–77.0)
Neutro Abs: 7.1 10*3/uL (ref 1.4–7.7)
Platelets: 247 10*3/uL (ref 150.0–400.0)
RBC: 5.07 Mil/uL (ref 3.87–5.11)
RDW: 19.8 % — ABNORMAL HIGH (ref 11.5–15.5)
WBC: 12.4 10*3/uL — AB (ref 4.0–10.5)

## 2014-03-21 LAB — POCT INR: INR: 4.1

## 2014-03-21 NOTE — Patient Instructions (Addendum)
Your physician recommends that you have  lab work today [place].  Your physician wants you to follow-up in: 6 months with Dr Aundra Dubin. (June 2016).  You will receive a reminder letter in the mail two months in advance. If you don't receive a letter, please call our office to schedule the follow-up appointment.

## 2014-03-22 NOTE — Progress Notes (Signed)
Patient ID: Priscilla Stevens, female   DOB: [birth year], 63 y.o.   MRN: [id number] PCP: Dr. Melford Aase  63 yo with history of rheumatic mitral valve disease presents after mitral valve replacement with mechanical MVR in 4/14.  She did not have significant CAD on cath prior to MVR.  Her post-op course was uneventful, no atrial fibrillation.    She is stable currently.  She can walk to the mailbox without problems and walks about 1/2 mile for exercise comfortably but has some dyspnea with longer distances.  She can climb a flight of stairs.  No PND or orthopnea. No chest pain.  Rare palpitations.  No syncope or lightheadedness.    Labs (12/14): K 4.6, creatinine 0.9  PMH: 1. Depression 2. Rheumatic heart disease: Patient developed moderate MS and moderate to severe MR.  4/14 he had MV replacement with #29 Sorin Carbomedics mitral valve.  Echo (5/14) with EF 55-60%, mild LVH, mechanical mitral valve with mean gradient 6 mmHg. 3. Morbid obesity 4. CLL 5. COPD 6. Fibromyalgia 7. LHC (3/14) with no significant CAD.  8. Medication-related QT prolongation  SH: Prior smoker, quit 1/14.  Married.  No ETOH.   FH: No premature CAD  ROS: All systems reviewed and negative except as per HPI.    Current Outpatient Prescriptions  Medication Sig Dispense Refill  . venlafaxine XR (EFFEXOR-XR) 37.5 MG 24 hr capsule Take 1 capsule by mouth daily.    Marland Kitchen ALPRAZolam (XANAX) 0.25 MG tablet Take 1 tablet (0.25 mg total) by mouth at bedtime as needed for sleep or anxiety. 30 tablet 0  . atorvastatin (LIPITOR) 10 MG tablet Take 10 mg by mouth daily.    . calcium carbonate (OS-CAL) 600 MG TABS Take 600 mg by mouth 2 (two) times daily with a meal.     . HYDROcodone-acetaminophen (NORCO/VICODIN) 5-325 MG per tablet Take 1 tablet by mouth every 6 (six) hours as needed for pain.    . Lansoprazole (PREVACID PO) Take 1 tablet by mouth daily. OTC    . metoprolol succinate (TOPROL-XL) 25 MG 24 hr tablet TAKE 1 TABLET BY  MOUTH TWICE DAILY 180 tablet 1  . promethazine (PHENERGAN) 25 MG tablet Take 25 mg by mouth as needed for nausea.    Marland Kitchen warfarin (COUMADIN) 5 MG tablet TAKE AS DIRECTED FROM COUMADIN CLINIC 120 tablet 1   No current facility-administered medications for this visit.   BP 108/62 mmHg  Pulse 88  Ht 5\' 5"  (1.651 m)  Wt 203 lb (92.08 kg)  BMI 33.78 kg/m2  SpO2 98% General: NAD Neck: No JVD, no thyromegaly or thyroid nodule.  Lungs: Occasional rhonchi CV: Nondisplaced PMI.  Heart regular S1/S2, mechanical S1, no S3/S4, no murmur.  No peripheral edema.  No carotid bruit.  Normal pedal pulses.  Abdomen: Soft, nontender, no hepatosplenomegaly, no distention.  Neurologic: Alert and oriented x 3.  Psych: Normal affect. Extremities: No clubbing or cyanosis.   Assessment/Plan: 1. Mechanical mitral valve: Well-seated on post-op echo in 5/14.  She will continue warfarin with goal INR 2.5-3.5.  Check CBC today.  Ideally, she would take aspirin . However, she has severe abdominal pain/burning even with enteric coated ASA, so she will not be able to take aspirin.  Patient will need antibiotic prophylaxis with dental work.  2. Obesity: Continue regular exercise, weight is down 5 lbs since last appointment.   Loralie Champagne 03/22/2014

## 2014-04-11 ENCOUNTER — Ambulatory Visit (INDEPENDENT_AMBULATORY_CARE_PROVIDER_SITE_OTHER): Payer: Medicare HMO | Admitting: *Deleted

## 2014-04-11 DIAGNOSIS — Z954 Presence of other heart-valve replacement: Secondary | ICD-10-CM

## 2014-04-11 DIAGNOSIS — I059 Rheumatic mitral valve disease, unspecified: Secondary | ICD-10-CM

## 2014-04-11 DIAGNOSIS — Z5181 Encounter for therapeutic drug level monitoring: Secondary | ICD-10-CM

## 2014-04-11 LAB — POCT INR: INR: 2.7

## 2014-05-09 ENCOUNTER — Ambulatory Visit (INDEPENDENT_AMBULATORY_CARE_PROVIDER_SITE_OTHER): Payer: Medicare HMO | Admitting: Surgery

## 2014-05-09 DIAGNOSIS — Z5181 Encounter for therapeutic drug level monitoring: Secondary | ICD-10-CM

## 2014-05-09 DIAGNOSIS — Z954 Presence of other heart-valve replacement: Secondary | ICD-10-CM

## 2014-05-09 DIAGNOSIS — I059 Rheumatic mitral valve disease, unspecified: Secondary | ICD-10-CM

## 2014-05-09 LAB — POCT INR: INR: 2.9

## 2014-06-20 ENCOUNTER — Ambulatory Visit (INDEPENDENT_AMBULATORY_CARE_PROVIDER_SITE_OTHER): Payer: Medicare HMO | Admitting: *Deleted

## 2014-06-20 DIAGNOSIS — Z954 Presence of other heart-valve replacement: Secondary | ICD-10-CM

## 2014-06-20 DIAGNOSIS — Z5181 Encounter for therapeutic drug level monitoring: Secondary | ICD-10-CM

## 2014-06-20 DIAGNOSIS — I059 Rheumatic mitral valve disease, unspecified: Secondary | ICD-10-CM

## 2014-06-20 LAB — POCT INR: INR: 3.4

## 2014-06-25 ENCOUNTER — Telehealth: Payer: Self-pay | Admitting: Cardiology

## 2014-06-25 MED ORDER — AMOXICILLIN 500 MG PO TABS: ORAL_TABLET | ORAL | Status: DC

## 2014-06-25 NOTE — Telephone Encounter (Signed)
New message    patient calling  dental work done 4.4.2016 need an prescription for antibiotic.

## 2014-06-25 NOTE — Telephone Encounter (Signed)
Amoxicillin 2 g x 1

## 2014-06-25 NOTE — Telephone Encounter (Signed)
Pt advised to take antibiotic prior to any dental examination or procedure. Pt states she is not allergic to penicillin. Pt advised to make dentist /oral surgeon aware that she is on warfarin, pt advised she may need lovenox bridge if she needs to hold warfarin.  Pt verbalized understanding.

## 2014-06-25 NOTE — Telephone Encounter (Signed)
done

## 2014-07-09 ENCOUNTER — Telehealth: Payer: Self-pay | Admitting: *Deleted

## 2014-07-09 NOTE — Telephone Encounter (Signed)
Patient called and stated she needs her INR level at 2.5 for her upcoming Dental Appointment on 07/18/14 to have a wisdom tooth pulled. Appointment set for 07/13/14.

## 2014-07-13 ENCOUNTER — Telehealth: Payer: Self-pay | Admitting: *Deleted

## 2014-07-13 ENCOUNTER — Ambulatory Visit (INDEPENDENT_AMBULATORY_CARE_PROVIDER_SITE_OTHER): Payer: Medicare HMO | Admitting: *Deleted

## 2014-07-13 DIAGNOSIS — I059 Rheumatic mitral valve disease, unspecified: Secondary | ICD-10-CM

## 2014-07-13 DIAGNOSIS — Z954 Presence of other heart-valve replacement: Secondary | ICD-10-CM | POA: Diagnosis not present

## 2014-07-13 DIAGNOSIS — Z5181 Encounter for therapeutic drug level monitoring: Secondary | ICD-10-CM | POA: Diagnosis not present

## 2014-07-13 LAB — POCT INR: INR: 2.1

## 2014-07-13 NOTE — Telephone Encounter (Signed)
Called pt and instructed that Dr Benson Norway has reviewed her INR encounter of yesterday and he states that INR does not need to be rechecked again  before procedure and this nurse instructed her to keep her appt as scheduled on April 29th in the coumadin clinic

## 2014-07-28 ENCOUNTER — Other Ambulatory Visit: Payer: Self-pay | Admitting: Cardiology

## 2014-07-30 ENCOUNTER — Other Ambulatory Visit: Payer: Self-pay

## 2014-07-30 MED ORDER — METOPROLOL SUCCINATE ER 25 MG PO TB24: ORAL_TABLET | ORAL | Status: DC

## 2014-08-03 ENCOUNTER — Ambulatory Visit (INDEPENDENT_AMBULATORY_CARE_PROVIDER_SITE_OTHER): Payer: Medicare HMO | Admitting: *Deleted

## 2014-08-03 DIAGNOSIS — I059 Rheumatic mitral valve disease, unspecified: Secondary | ICD-10-CM

## 2014-08-03 DIAGNOSIS — Z954 Presence of other heart-valve replacement: Secondary | ICD-10-CM

## 2014-08-03 DIAGNOSIS — Z5181 Encounter for therapeutic drug level monitoring: Secondary | ICD-10-CM

## 2014-08-03 LAB — POCT INR: INR: 3.4

## 2014-08-31 ENCOUNTER — Ambulatory Visit (INDEPENDENT_AMBULATORY_CARE_PROVIDER_SITE_OTHER): Payer: Medicare HMO | Admitting: *Deleted

## 2014-08-31 DIAGNOSIS — Z954 Presence of other heart-valve replacement: Secondary | ICD-10-CM | POA: Diagnosis not present

## 2014-08-31 DIAGNOSIS — I059 Rheumatic mitral valve disease, unspecified: Secondary | ICD-10-CM | POA: Diagnosis not present

## 2014-08-31 DIAGNOSIS — Z5181 Encounter for therapeutic drug level monitoring: Secondary | ICD-10-CM

## 2014-08-31 LAB — POCT INR: INR: 3

## 2014-09-18 ENCOUNTER — Other Ambulatory Visit: Payer: Self-pay | Admitting: Cardiology

## 2014-09-20 ENCOUNTER — Ambulatory Visit (INDEPENDENT_AMBULATORY_CARE_PROVIDER_SITE_OTHER): Payer: Medicare HMO | Admitting: *Deleted

## 2014-09-20 ENCOUNTER — Ambulatory Visit: Payer: Medicare HMO | Admitting: Physician Assistant

## 2014-09-20 DIAGNOSIS — Z5181 Encounter for therapeutic drug level monitoring: Secondary | ICD-10-CM

## 2014-09-20 DIAGNOSIS — I059 Rheumatic mitral valve disease, unspecified: Secondary | ICD-10-CM | POA: Diagnosis not present

## 2014-09-20 DIAGNOSIS — Z954 Presence of other heart-valve replacement: Secondary | ICD-10-CM | POA: Diagnosis not present

## 2014-09-20 LAB — POCT INR: INR: 4.5

## 2014-09-24 ENCOUNTER — Telehealth: Payer: Self-pay | Admitting: *Deleted

## 2014-09-24 NOTE — Telephone Encounter (Signed)
Called patient to advise that we have not heard anything back from her dental office in reference to her dental extraction and there recommended plan.  [facility]D has been in contact with [employer]  [phone].  Patient has a death in her family and will reschedule her dental appointment and inform us of the date. We have notified the dental office that they need to contact us so we can approve to hold the coumadin and proceed with Lovenox Bridge or not hold as directed by Cardiologist.

## 2014-10-02 ENCOUNTER — Ambulatory Visit (INDEPENDENT_AMBULATORY_CARE_PROVIDER_SITE_OTHER): Payer: Medicare HMO | Admitting: *Deleted

## 2014-10-02 DIAGNOSIS — Z954 Presence of other heart-valve replacement: Secondary | ICD-10-CM

## 2014-10-02 DIAGNOSIS — Z5181 Encounter for therapeutic drug level monitoring: Secondary | ICD-10-CM

## 2014-10-02 DIAGNOSIS — I059 Rheumatic mitral valve disease, unspecified: Secondary | ICD-10-CM

## 2014-10-02 LAB — POCT INR: INR: 2.8

## 2014-10-18 NOTE — Progress Notes (Signed)
Cardiology Office Note   Date:  10/19/2014   ID:  Priscilla Stevens, DOB Apr 09, 1950, MRN [id number]  PCP:  Chesley Noon, MD  Cardiologist:  Dr. Loralie Champagne     Chief Complaint  Patient presents with  . Mitral Valve Disorder     History of Present Illness: Priscilla Stevens is a 64 y.o. female with a hx of rheumatic mitral valve disease s/p mitral valve replacement with mechanical MVR in 4/14. She did not have significant CAD on cath prior to MVR. Her post-op course was uneventful, no atrial fibrillation.   Last seen by Dr. Loralie Champagne 03/2014.  Of note she is not able to take ASA due to GI side effects.  Goal INR is 2.5-3.5.    She returns for follow-up. She is here alone. Her main complaint is increased dyspnea with exertion. She is NYHA 2b-3.  She notices this more with extreme cold or extreme heat. She denies wheezing but does note a nonproductive cough. She's been told in the past that she has asthma. In reviewing her chart, PFTs prior to her valve surgery demonstrated mixed obstructive and restrictive lung defect with FEV1 49% predicted. She did have positive response to bronchodilator. She denies chest, arm or jaw discomfort. She denies syncope. She denies orthopnea or PND. She denies significant pedal edema. She thinks that her weight did increase to pounds recently.   Studies/Reports Reviewed Today:  Echo 08/2012 - Mild LVH. EF 55% to 60%. - Aortic valve: Trivial regurgitation. - Mitral valve: MV prosthesis is well seated. Peak and meangradients across valve are 9 and 6 mm Hg respectivelywhich is normal. Valve area by pressure half-time:2.29cm^2. Valve area by continuity equation (using LVOTflow): 1.82cm^2. - Left atrium: The atrium was mildly dilated.  Carotid US 06/2012 No significant extracranial carotid artery stenosisdemonstrated.  LHC 06/2012 Coronary angiography: Coronary dominance: right Left mainstem: No significant disease.  Left anterior  descending (LAD): No significant disease.  Left circumflex (LCx): 30% ostial LCx.  Right coronary artery (RCA): No significant disease.  Left ventriculography: Left ventricular systolic function is normal, LVEF is estimated at 60-65% with no wall motion abnormalities in the RAO projection. 3+ mitral regurgitation.    Past Medical History  Diagnosis Date  . Anxiety   . Cough     Eval by pulm in past: essentially normal PFTs, cough ddx was variant asthma, upper airway cough syndrome (previously termed post nasal drip syndrome) or GERD,   . Diverticulitis   . Severe mitral regurgitation 06/20/2012  . Mitral stenosis 06/20/2012  . Morbid obesity 06/20/2012  . History of tobacco abuse 06/22/2012  . Acute on chronic diastolic heart failure 0/86/5784  . Complication of anesthesia   . PONV (postoperative nausea and vomiting)   . Pneumonia     2-3 YRS AGO   . Heart murmur   . CHF (congestive heart failure)   . GERD (gastroesophageal reflux disease)   . Headache(784.0)     HX MIGRAINES   . Fibromyalgia   . CLL (chronic lymphocytic leukemia)   . Dysrhythmia     FAST HB TAKING METOPROLOL NOT FOR HTN  . Chronic diastolic congestive heart failure 07/07/2012  . S/P mitral valve replacement with metallic valve 09/12/6293    29 mm Sorin Carbomedics mechanical valve  1. Depression 2. Rheumatic heart disease: Patient developed moderate MS and moderate to severe MR. 4/14 he had MV replacement with #29 Sorin Carbomedics mitral valve. Echo (5/14) with EF 55-60%, mild LVH, mechanical mitral valve  with mean gradient 6 mmHg. 3. Morbid obesity 4. CLL 5. COPD 6. Fibromyalgia 7. LHC (3/14) with no significant CAD.  8. Medication-related QT prolongation   Past Surgical History  Procedure Laterality Date  . Appendectomy    . Total vaginal hysterectomy    . Tonsillectomy    . Tee without cardioversion N/A 06/22/2012    Procedure: TRANSESOPHAGEAL ECHOCARDIOGRAM (TEE);  Surgeon: Larey Dresser, MD;   Location: [place];  Service: Cardiovascular;  Laterality: N/A;  . No past surgeries      TUBAL PREG   . Intraoperative transesophageal echocardiogram N/A 07/07/2012    Procedure: INTRAOPERATIVE TRANSESOPHAGEAL ECHOCARDIOGRAM;  Surgeon: Rexene Alberts, MD;  Location: [place];  Service: Open Heart Surgery;  Laterality: N/A;  . Mitral valve replacement N/A 07/07/2012    Procedure: MITRAL VALVE (MV) REPLACEMENT;  Surgeon: Rexene Alberts, MD;  Location: [place];  Service: Open Heart Surgery;  Laterality: N/A;  . Left and right heart catheterization with coronary angiogram N/A 06/21/2012    Procedure: LEFT AND RIGHT HEART CATHETERIZATION WITH CORONARY ANGIOGRAM;  Surgeon: Larey Dresser, MD;  Location[facility] C[facility];  Service: Cardiovascular;  Laterality: N/A;     Current Outpatient Prescriptions  Medication Sig Dispense Refill  . ALPRAZolam (XANAX) 0.25 MG tablet Take 1 tablet (0.25 mg total) by mouth at bedtime as needed for sleep or anxiety. 30 tablet 0  . calcium carbonate (OS-CAL) 600 MG TABS Take 600 mg by mouth 2 (two) times daily with a meal.     . ezetimibe (ZETIA) 10 MG tablet Take 10 mg by mouth daily.    Marland Kitchen HYDROcodone-acetaminophen (NORCO/VICODIN) 5-325 MG per tablet Take 1 tablet by mouth every 6 (six) hours as needed for pain.    . Lansoprazole (PREVACID PO) Take 1 tablet by mouth daily. OTC    . metoprolol succinate (TOPROL-XL) 25 MG 24 hr tablet TAKE 1 TABLET TWICE DAILY 180 tablet 0  . promethazine (PHENERGAN) 25 MG tablet Take 25 mg by mouth as needed for nausea.    Marland Kitchen venlafaxine XR (EFFEXOR-XR) 37.5 MG 24 hr capsule Take 1 capsule by mouth daily.    Marland Kitchen warfarin (COUMADIN) 5 MG tablet TAKE AS DIRECTED FROM COUMADIN CLINIC 120 tablet 1  . albuterol (PROVENTIL HFA;VENTOLIN HFA) 108 (90 BASE) MCG/ACT inhaler Inhale 2 puffs into the lungs every 6 (six) hours as needed for wheezing or shortness of breath. 1 Inhaler 0   No current facility-administered medications for this visit.     Allergies:   Statins and Aspirin    Social History:  The patient  reports that she quit smoking about 2 years ago. She has never used smokeless tobacco. She reports that she drinks alcohol. She reports that she does not use illicit drugs.   Family History:  The patient's family history includes Cancer - Lung in her father; Congestive Heart Failure in her mother.    ROS:   Please see the history of present illness.   Review of Systems  Constitution: Positive for diaphoresis and malaise/fatigue.  HENT: Positive for headaches.   Cardiovascular: Positive for dyspnea on exertion.  Hematologic/Lymphatic: Bruises/bleeds easily.  Musculoskeletal: Positive for joint pain.  All other systems reviewed and are negative.     PHYSICAL EXAM: VS:  BP 110/72 mmHg  Pulse 85  Ht 5\' 5"  (1.651 m)  Wt 205 lb 12.8 oz (93.35 kg)  BMI 34.25 kg/m2    Wt Readings from Last 3 Encounters:  10/19/14 205 lb 12.8 oz (  93.35 kg)  03/21/14 203 lb (92.08 kg)  09/28/13 208 lb (94.348 kg)     GEN: Well nourished, well developed, in no acute distress HEENT: normal Neck: no JVD,   no masses Cardiac:  Mechanical S1/ Normal S2, RRR; no murmur ,  no rubs or gallops, no edema   Respiratory: Decreased breath sounds bilaterally with late, faint expiratory wheezes, no rales GI: soft, nontender, nondistended, + BS MS: no deformity or atrophy Skin: warm and dry  Neuro:  CNs II-XII intact, Strength and sensation are intact Psych: Normal affect   EKG:  EKG is ordered today.  It demonstrates:   NSR, HR 85, normal axis, nonspecific ST-T wave changes, no change from prior tracing   Recent Labs: 03/21/2014: Hemoglobin 12.4; Platelets 247.0    Lipid Panel    Component Value Date/Time   CHOL 133 06/21/2012 0625   TRIG 88 06/21/2012 0625   HDL 51 06/21/2012 0625   CHOLHDL 2.6 06/21/2012 0625   VLDL 18 06/21/2012 0625   LDLCALC 64 06/21/2012 0625      ASSESSMENT AND PLAN:   1.  Dyspnea:  I suspect she  has COPD.  She smoked 1 ppd x 20 years and quit 2 years ago.  She was given an inhaler in the past for "asthma."  She has a hx of chronic cough. She has more symptoms with extreme changes in the temperature.  PFTs in 2014 were abnormal with + bronchodilator response.  She has some faint, late expiratory wheezing.  She does not look volume overloaded.  Her last echo was in 2014.  She had no significant CAD on LHC prior to her MVR just 2 years ago.  I doubt ischemia as a cause of her symptoms.  -  BMET, BNP, CXR today  -  Arrange FU Echo  -  Proventil HFA 1-2 puffs prn (#1, 0 R)  -  FU with PCP to decide on +/- repeat PFTs, etc.  2.  Rheumatic Heart Disease s/p Mechanical MVR:  Obtain FU echo.  Continue SBE prophylaxis.  Continue Coumadin.  As noted, she cannot take ASA.  3.  Hyperlipidemia:  Recent LDL 140.  This is managed by PCP.    Medication Changes: Current medicines are reviewed at length with the patient today.  Concerns regarding medicines are as outlined above.  The following changes have been made:   Discontinued Medications   AMOXICILLIN (AMOXIL) 500 MG TABLET    Take 4 tablets (2g) by mouth 30-60 minutes prior to dental appt/procedure   Modified Medications   No medications on file   New Prescriptions   ALBUTEROL (PROVENTIL HFA;VENTOLIN HFA) 108 (90 BASE) MCG/ACT INHALER    Inhale 2 puffs into the lungs every 6 (six) hours as needed for wheezing or shortness of breath.     Labs/ tests ordered today include:   Orders Placed This Encounter  Procedures  . DG Chest 2 View  . Basic Metabolic Panel (BMET)  . B Nat Peptide  . EKG 12-Lead  . Echocardiogram     Disposition:   FU with Dr. Loralie Champagne 6 mos.    Signed, Versie Starks, MHS 10/19/2014 1:12 PM    [facility] [address], [place], [place]  [address] Phone: [phone]; Fax: [phone]

## 2014-10-19 ENCOUNTER — Ambulatory Visit (INDEPENDENT_AMBULATORY_CARE_PROVIDER_SITE_OTHER): Payer: Medicare HMO | Admitting: Physician Assistant

## 2014-10-19 ENCOUNTER — Encounter: Payer: Self-pay | Admitting: Physician Assistant

## 2014-10-19 ENCOUNTER — Ambulatory Visit
Admission: RE | Admit: 2014-10-19 | Discharge: 2014-10-19 | Disposition: A | Discharge status: Home / Self Care | Payer: Medicare HMO | Source: Ambulatory Visit | Attending: Physician Assistant | Admitting: Physician Assistant

## 2014-10-19 ENCOUNTER — Ambulatory Visit (INDEPENDENT_AMBULATORY_CARE_PROVIDER_SITE_OTHER): Payer: Medicare HMO | Admitting: Pharmacist

## 2014-10-19 VITALS — BP 110/72 | HR 85 | Ht 65.0 in | Wt 205.8 lb

## 2014-10-19 DIAGNOSIS — E785 Hyperlipidemia, unspecified: Secondary | ICD-10-CM | POA: Diagnosis not present

## 2014-10-19 DIAGNOSIS — Z954 Presence of other heart-valve replacement: Secondary | ICD-10-CM | POA: Diagnosis not present

## 2014-10-19 DIAGNOSIS — Z952 Presence of prosthetic heart valve: Secondary | ICD-10-CM

## 2014-10-19 DIAGNOSIS — Z5181 Encounter for therapeutic drug level monitoring: Secondary | ICD-10-CM

## 2014-10-19 DIAGNOSIS — I5033 Acute on chronic diastolic (congestive) heart failure: Secondary | ICD-10-CM

## 2014-10-19 DIAGNOSIS — R0602 Shortness of breath: Secondary | ICD-10-CM | POA: Diagnosis not present

## 2014-10-19 DIAGNOSIS — I059 Rheumatic mitral valve disease, unspecified: Secondary | ICD-10-CM | POA: Diagnosis not present

## 2014-10-19 LAB — BASIC METABOLIC PANEL
BUN: 12 mg/dL (ref 6–23)
CALCIUM: 9.5 mg/dL (ref 8.4–10.5)
CO2: 31 meq/L (ref 19–32)
CREATININE: 0.96 mg/dL (ref 0.40–1.20)
Chloride: 102 mEq/L (ref 96–112)
GFR: 62.67 mL/min (ref >60.00)
Glucose, Bld: 98 mg/dL (ref 70–99)
Potassium: 4.6 mEq/L (ref 3.5–5.1)
SODIUM: 138 meq/L (ref 135–145)

## 2014-10-19 LAB — BRAIN NATRIURETIC PEPTIDE: Pro B Natriuretic peptide (BNP): 45 pg/mL (ref 0.0–100.0)

## 2014-10-19 LAB — POCT INR: INR: 3.2

## 2014-10-19 MED ORDER — ALBUTEROL SULFATE HFA 108 (90 BASE) MCG/ACT IN AERS: 2.0000 | INHALATION_SPRAY | Freq: Four times a day (QID) | RESPIRATORY_TRACT | Status: DC | PRN

## 2014-10-19 NOTE — Patient Instructions (Signed)
Medication Instructions:  1. PROVENTIL HFA AS DIRECTED; FOLLOW UP WITH PRIMARY CARE FOR FURTHER REFILLS   Labwork: TODAY BMET, BNP  Testing/Procedures: 1. CHEST X-RAY AT [place]  2. Your physician has requested that you have an echocardiogram. Echocardiography is a painless test that uses sound waves to create images of your heart. It provides your doctor with information about the size and shape of your heart and how well your heart's chambers and valves are working. This procedure takes approximately one hour. There are no restrictions for this procedure.   Follow-Up: Your physician wants you to follow-up in: [place] DR. Aundra Dubin You will receive a reminder letter in the mail two months in advance. If you don't receive a letter, please call our office to schedule the follow-up appointment.   Any Other Special Instructions Will Be Listed Below (If Applicable).

## 2014-10-22 ENCOUNTER — Telehealth: Payer: Self-pay | Admitting: Physician Assistant

## 2014-10-22 NOTE — Telephone Encounter (Signed)
Follow Up  Pt called back to discuss the lab results. Please call

## 2014-10-22 NOTE — Telephone Encounter (Signed)
Pt notified of cxr and lab results today by phone with verbal understanding to all results given today. Pt said thank you.

## 2014-10-25 ENCOUNTER — Ambulatory Visit (HOSPITAL_COMMUNITY): Payer: Medicare HMO

## 2014-11-05 ENCOUNTER — Other Ambulatory Visit: Payer: Self-pay

## 2014-11-05 ENCOUNTER — Telehealth: Payer: Self-pay

## 2014-11-05 MED ORDER — AMOXICILLIN 500 MG PO TABS: ORAL_TABLET | ORAL | Status: DC

## 2014-11-05 NOTE — Telephone Encounter (Signed)
Pt states she has several upcoming dental appts.

## 2014-11-05 NOTE — Telephone Encounter (Signed)
[facility] pharmacy request a refill for amoxicillin

## 2014-11-05 NOTE — Telephone Encounter (Signed)
Pt requesting refill of amoxicillin for prevention of bacterial endocarditis

## 2014-11-16 ENCOUNTER — Ambulatory Visit (INDEPENDENT_AMBULATORY_CARE_PROVIDER_SITE_OTHER): Payer: Medicare HMO | Admitting: Pharmacist

## 2014-11-16 DIAGNOSIS — I059 Rheumatic mitral valve disease, unspecified: Secondary | ICD-10-CM | POA: Diagnosis not present

## 2014-11-16 DIAGNOSIS — Z5181 Encounter for therapeutic drug level monitoring: Secondary | ICD-10-CM

## 2014-11-16 DIAGNOSIS — Z954 Presence of other heart-valve replacement: Secondary | ICD-10-CM

## 2014-11-16 LAB — POCT INR: INR: 3

## 2014-12-05 ENCOUNTER — Other Ambulatory Visit (HOSPITAL_COMMUNITY): Payer: Medicare HMO

## 2014-12-14 ENCOUNTER — Other Ambulatory Visit: Payer: Self-pay

## 2014-12-14 ENCOUNTER — Ambulatory Visit (HOSPITAL_COMMUNITY): Payer: Medicare HMO | Attending: Internal Medicine

## 2014-12-14 DIAGNOSIS — Z87891 Personal history of nicotine dependence: Secondary | ICD-10-CM | POA: Insufficient documentation

## 2014-12-14 DIAGNOSIS — Z6834 Body mass index (BMI) 34.0-34.9, adult: Secondary | ICD-10-CM | POA: Insufficient documentation

## 2014-12-14 DIAGNOSIS — I517 Cardiomegaly: Secondary | ICD-10-CM | POA: Insufficient documentation

## 2014-12-14 DIAGNOSIS — Z954 Presence of other heart-valve replacement: Secondary | ICD-10-CM

## 2014-12-14 DIAGNOSIS — I059 Rheumatic mitral valve disease, unspecified: Secondary | ICD-10-CM | POA: Diagnosis present

## 2014-12-14 DIAGNOSIS — E669 Obesity, unspecified: Secondary | ICD-10-CM | POA: Diagnosis not present

## 2014-12-14 DIAGNOSIS — R0602 Shortness of breath: Secondary | ICD-10-CM

## 2014-12-14 DIAGNOSIS — Z952 Presence of prosthetic heart valve: Secondary | ICD-10-CM

## 2014-12-17 ENCOUNTER — Encounter: Payer: Self-pay | Admitting: Physician Assistant

## 2014-12-28 ENCOUNTER — Ambulatory Visit (INDEPENDENT_AMBULATORY_CARE_PROVIDER_SITE_OTHER): Payer: Medicare HMO | Admitting: *Deleted

## 2014-12-28 DIAGNOSIS — I059 Rheumatic mitral valve disease, unspecified: Secondary | ICD-10-CM

## 2014-12-28 DIAGNOSIS — Z5181 Encounter for therapeutic drug level monitoring: Secondary | ICD-10-CM

## 2014-12-28 DIAGNOSIS — Z954 Presence of other heart-valve replacement: Secondary | ICD-10-CM | POA: Diagnosis not present

## 2014-12-28 LAB — POCT INR: INR: 3.9

## 2015-01-25 ENCOUNTER — Ambulatory Visit (INDEPENDENT_AMBULATORY_CARE_PROVIDER_SITE_OTHER): Payer: Medicare HMO | Admitting: *Deleted

## 2015-01-25 DIAGNOSIS — Z954 Presence of other heart-valve replacement: Secondary | ICD-10-CM

## 2015-01-25 DIAGNOSIS — Z5181 Encounter for therapeutic drug level monitoring: Secondary | ICD-10-CM | POA: Diagnosis not present

## 2015-01-25 DIAGNOSIS — I059 Rheumatic mitral valve disease, unspecified: Secondary | ICD-10-CM

## 2015-01-25 LAB — POCT INR: INR: 3.8

## 2015-01-28 ENCOUNTER — Other Ambulatory Visit: Payer: Self-pay | Admitting: Cardiology

## 2015-01-29 ENCOUNTER — Ambulatory Visit (INDEPENDENT_AMBULATORY_CARE_PROVIDER_SITE_OTHER): Payer: Medicare HMO | Admitting: *Deleted

## 2015-01-29 DIAGNOSIS — Z954 Presence of other heart-valve replacement: Secondary | ICD-10-CM

## 2015-01-29 DIAGNOSIS — I059 Rheumatic mitral valve disease, unspecified: Secondary | ICD-10-CM | POA: Diagnosis not present

## 2015-01-29 DIAGNOSIS — Z5181 Encounter for therapeutic drug level monitoring: Secondary | ICD-10-CM

## 2015-01-29 LAB — POCT INR: INR: 2.3

## 2015-02-12 ENCOUNTER — Ambulatory Visit (INDEPENDENT_AMBULATORY_CARE_PROVIDER_SITE_OTHER): Payer: Medicare HMO | Admitting: *Deleted

## 2015-02-12 DIAGNOSIS — Z5181 Encounter for therapeutic drug level monitoring: Secondary | ICD-10-CM | POA: Diagnosis not present

## 2015-02-12 DIAGNOSIS — I059 Rheumatic mitral valve disease, unspecified: Secondary | ICD-10-CM | POA: Diagnosis not present

## 2015-02-12 DIAGNOSIS — Z954 Presence of other heart-valve replacement: Secondary | ICD-10-CM | POA: Diagnosis not present

## 2015-02-12 LAB — POCT INR: INR: 3

## 2015-03-05 ENCOUNTER — Ambulatory Visit (INDEPENDENT_AMBULATORY_CARE_PROVIDER_SITE_OTHER): Payer: Medicare HMO

## 2015-03-05 DIAGNOSIS — Z954 Presence of other heart-valve replacement: Secondary | ICD-10-CM | POA: Diagnosis not present

## 2015-03-05 DIAGNOSIS — I059 Rheumatic mitral valve disease, unspecified: Secondary | ICD-10-CM | POA: Diagnosis not present

## 2015-03-05 DIAGNOSIS — Z5181 Encounter for therapeutic drug level monitoring: Secondary | ICD-10-CM | POA: Diagnosis not present

## 2015-03-05 LAB — POCT INR: INR: 3.3

## 2015-03-08 IMAGING — CR DG CHEST 1V PORT
1 series · 1 of 1 positions shown · non-contrast
Comparison: 12/27/2008

CLINICAL DATA: Severe shortness of breath.  Pulmonary edema.
Cardiomegaly.

PORTABLE CHEST - 1 VIEW

[AP]
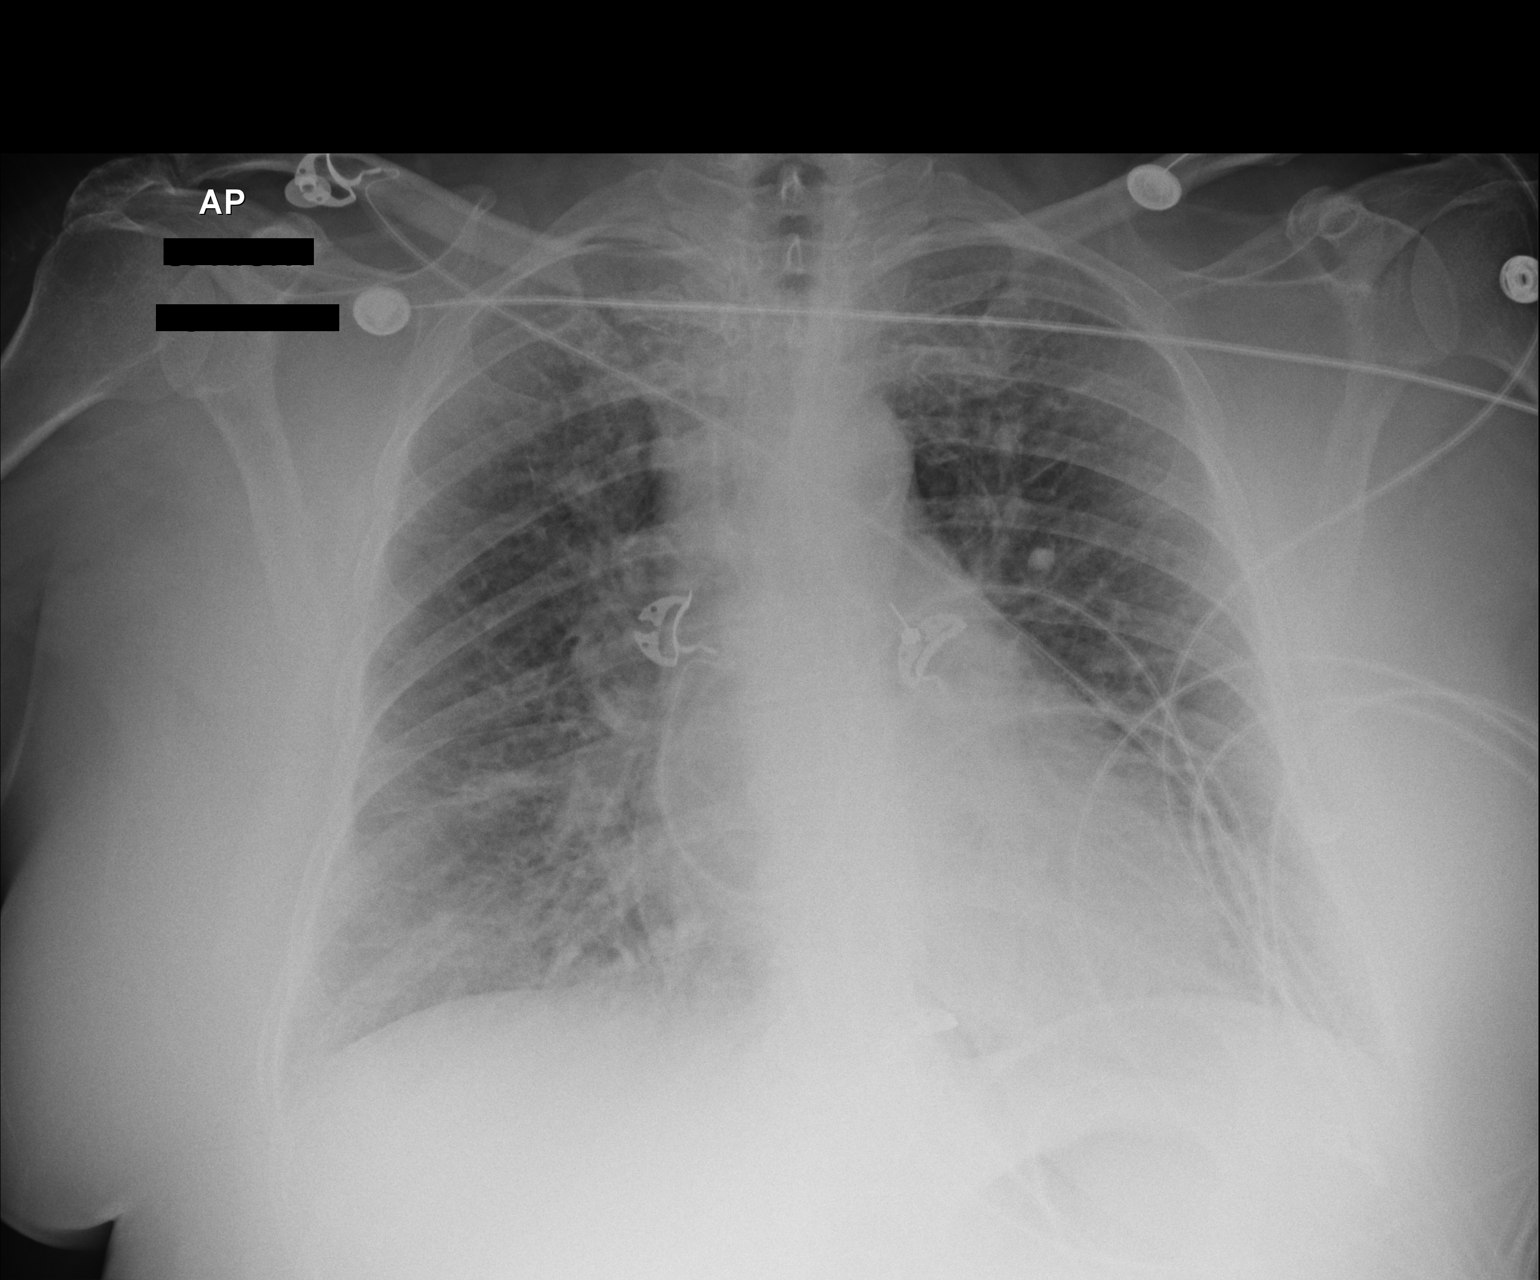

[1 of 1 positions shown; findings below may reference images not displayed]

FINDINGS: The shallow inspiration.  Cardiac enlargement with mild
prominence of pulmonary vascularity.  Diffuse interstitial changes
suggesting interstitial edema.  Changes are new since previous
study.  No blunting of costophrenic angles.  No pneumothorax.  No
focal airspace disease.
IMPRESSION: Cardiac enlargement with pulmonary vascular congestion and
interstitial edema.

## 2015-03-11 IMAGING — CT CT CTA ABD/PEL W/CM AND/OR W/O CM
1 of 2 series · 13 of 32 positions shown, 17 images · IV contrast (omnipaque)
Comparison: None.

CLINICAL DATA: Preop of mitral valve replacement.  Evaluate for
aorto-iliac occlusive disease.  History of chronic lymphocytic
leukemia.

CT ANGIOGRAPHY ABDOMEN AND PELVIS
TECHNIQUE: Multidetector CT imaging through the abdomen and pelvis
was performed using the standard protocol during bolus
administration of intravenous contrast.  Multiplanar reconstructed
images including MIPs were obtained and reviewed to evaluate the
vascular anatomy.
Contrast: 80mL OMNIPAQUE IOHEXOL 350 MG/ML. The initial imaging
sequence did not result in a good contrast bolus in the aorta.
Therefore, the images were repeated, and a total of 160 ml
Omnipaque 350 was utilized.

[Series 4: soft tissue · axial · 0.80mm/px · z∈[+267,+715]mm · 13 of 250 slices shown, 17 images]
[im 17/250  soft-tissue]
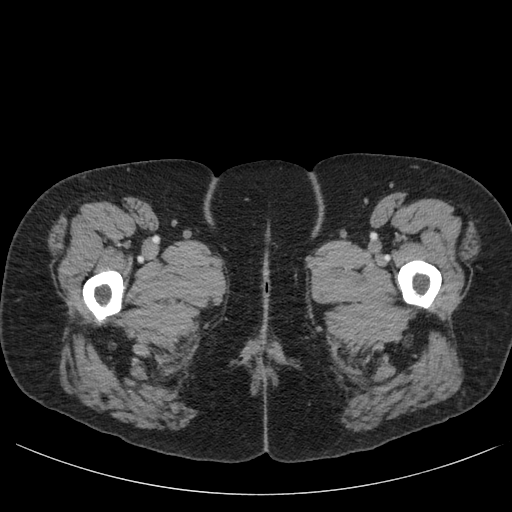
[im 17/250  bone]
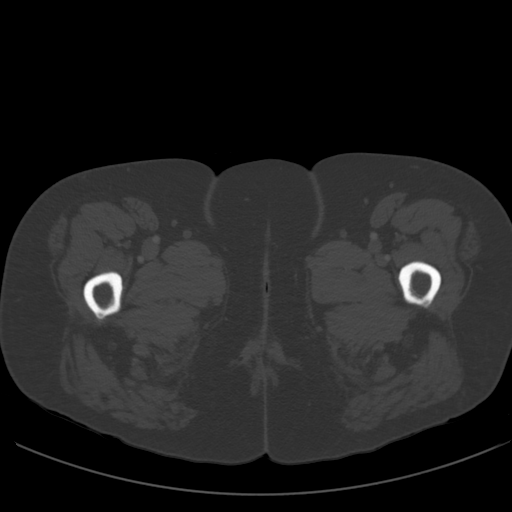
[im 34/250  soft-tissue]
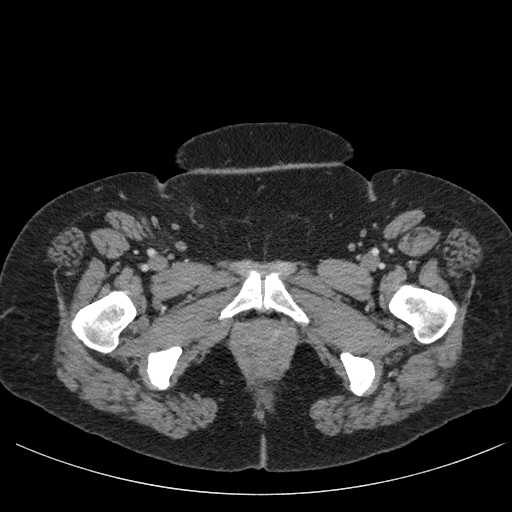
[im 59/250  soft-tissue]
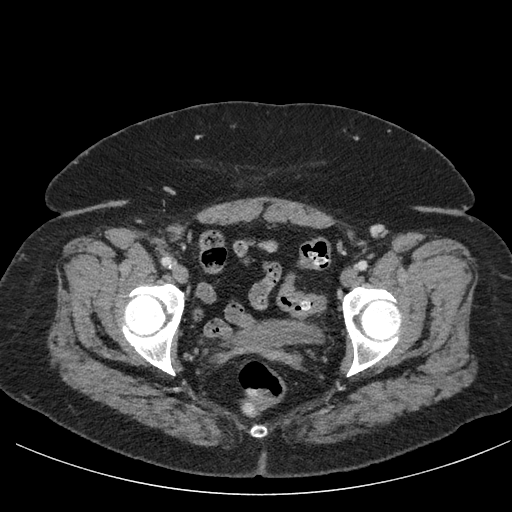
[im 84/250  soft-tissue]
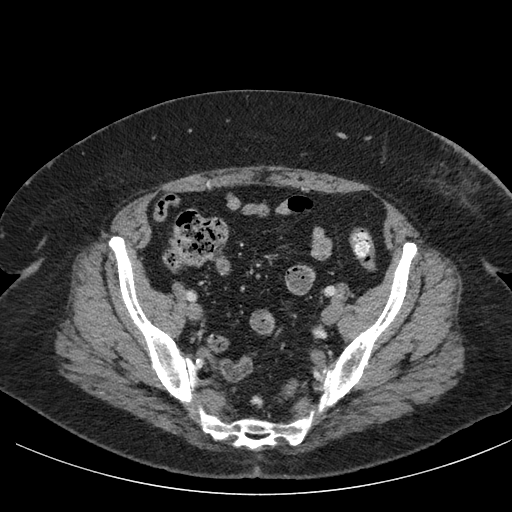
[im 100/250  soft-tissue]
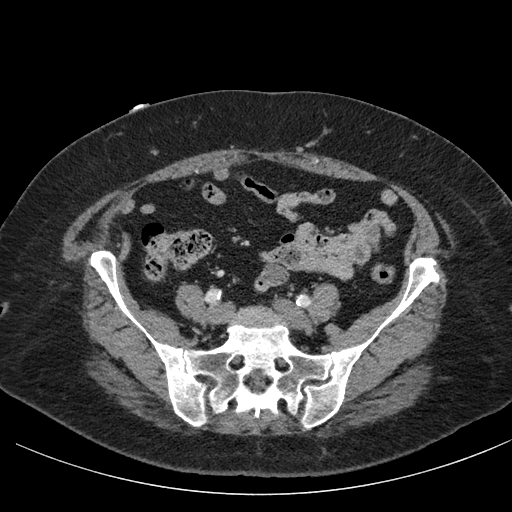
[im 125/250  soft-tissue]
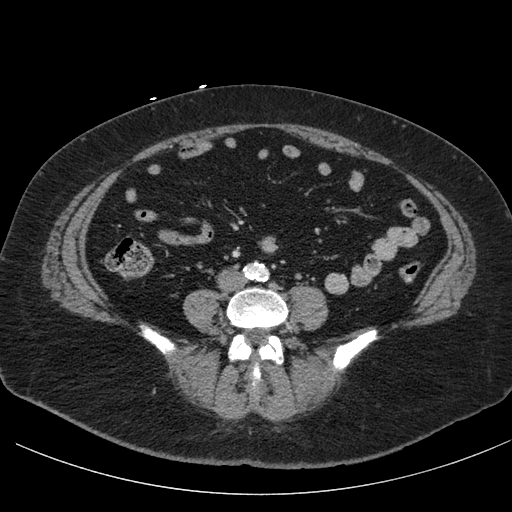
[im 150/250  soft-tissue]
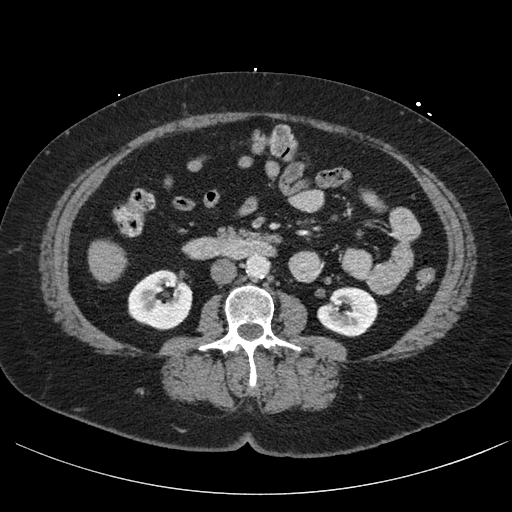
[im 167/250  soft-tissue]
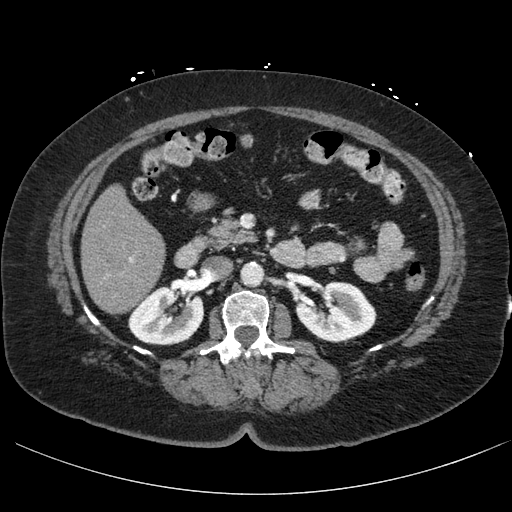
[im 191/250  soft-tissue]
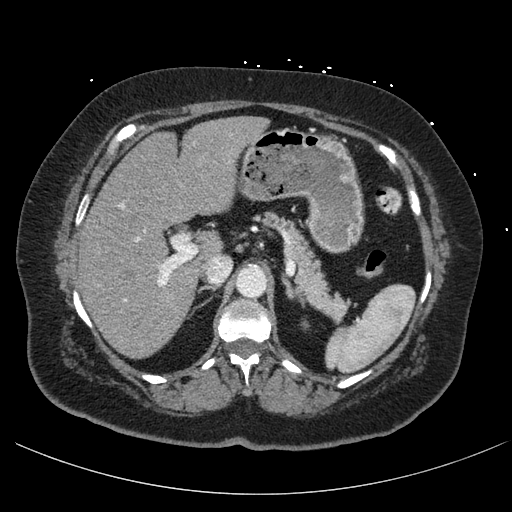
[im 191/250  bone]
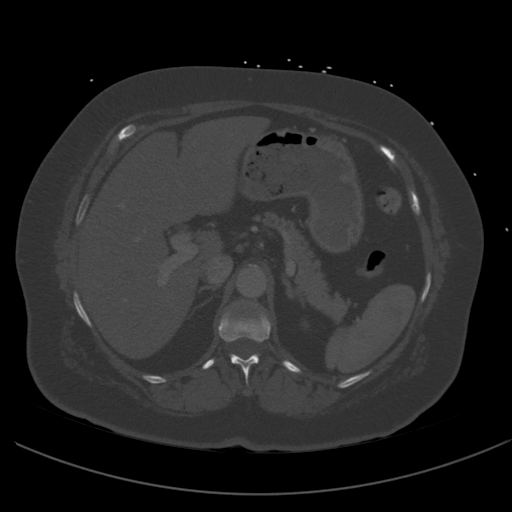
[im 216/250  soft-tissue]
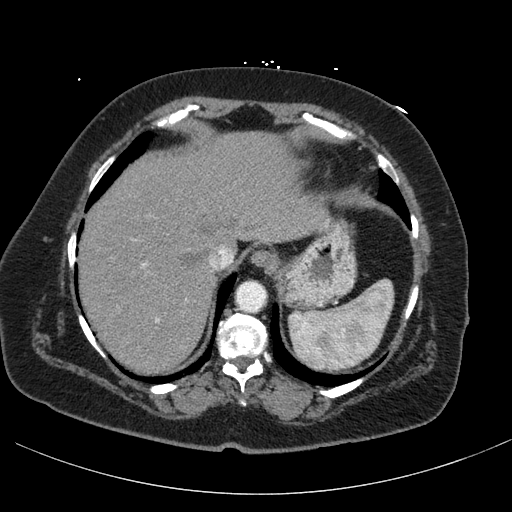
[im 216/250  lung]
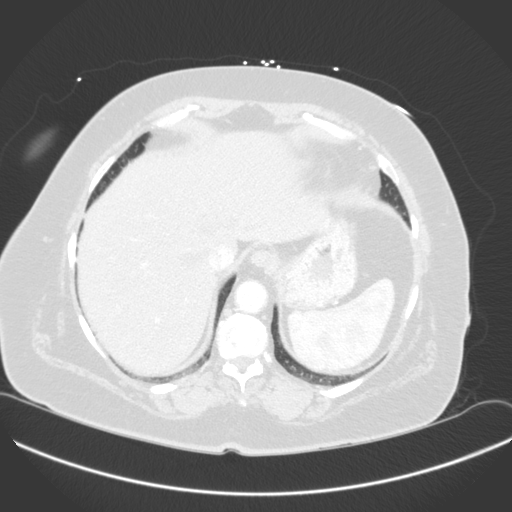
[im 225/250  lung]
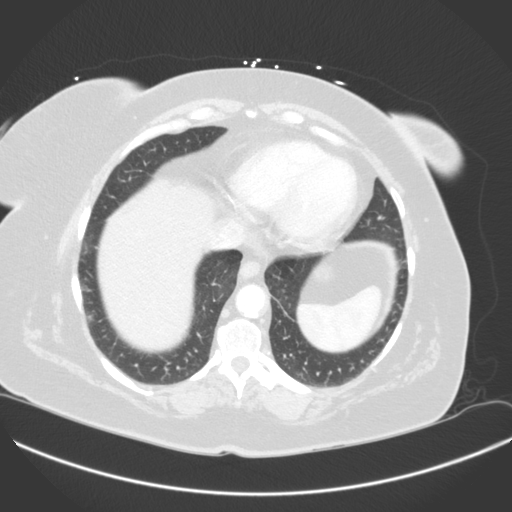
[im 233/250  soft-tissue]
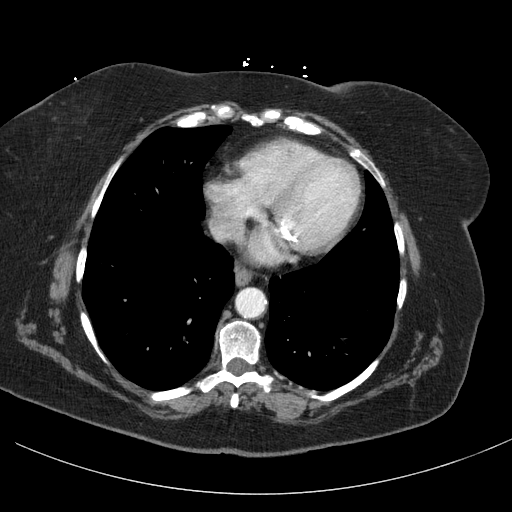
[im 233/250  lung]
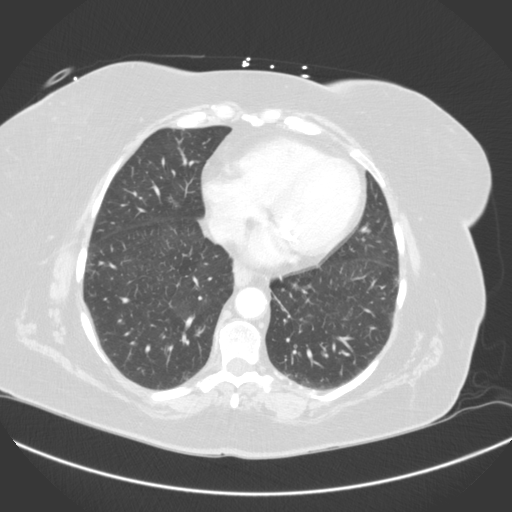
[im 241/250  lung]
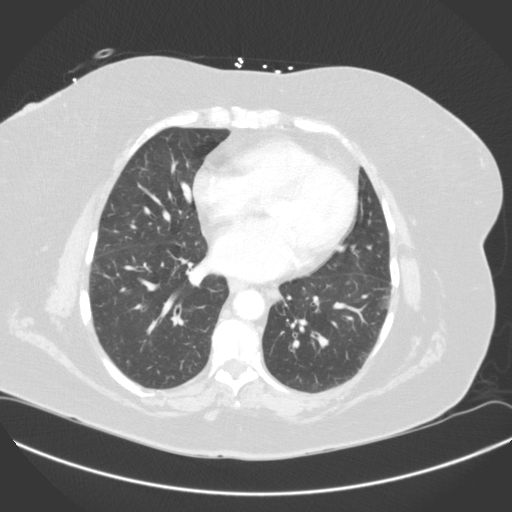

[13 of 32 positions shown; findings below may reference images not displayed]

FINDINGS: The contrast bolus in the abdominal aorta and the
bilateral iliofemoral arteries is adequate.  Moderate calcified and
noncalcified plaque in the suprarenal and infrarenal abdominal
aorta without evidence of aneurysm, dissection, or stenosis.
Widely patent celiac and superior mesenteric arteries.  Calcified
plaque at the origin of the IMA without visible stenosis.  Single
renal arteries supplying each kidney; the right main renal artery
is widely patent, and there is calcified plaque at the origin of
the left main renal artery without visible stenosis. Moderate to
extensive calcified plaque involving the common iliac arteries
bilaterally, without evidence of hemodynamically significant
stenosis.  Calcified plaque involving the common femoral arteries
bilaterally without stenosis.  (Soft tissue stranding adjacent to
the right common femoral artery is presumably related to recent
cardiac catheterization.)

Diffuse hepatic steatosis.  Approximate 1.9 cm simple cyst in the
caudate lobe of the liver; no significant focal hepatic parenchymal
abnormalities.  Normal-appearing spleen allowing for the early
arterial phase of enhancement.  Normal pancreas, adrenal glands,
and kidneys.  No significant lymphadenopathy.  Gallbladder
contracted as there is food within the stomach.  No biliary ductal
dilation.

Stomach normal in appearance.  Inspissated stool-like material
ovary several centimeter segment of the distal and terminal ileum.
Small bowel otherwise normal in appearance.  Scattered diverticula
involving the ascending and transverse colon, with more extensive
diverticulosis involving the distal descending and sigmoid colon.
No evidence of acute diverticulitis.  Appendix surgically absent.
No ascites.

Urinary bladder decompressed and unremarkable.  Uterus surgically
absent.  No adnexal masses or free pelvic fluid.

Bone window images demonstrate mild degenerative changes involving
the lower thoracic spine.  Visualized lung bases clear, though
there are scattered areas of focal hyperlucency.  Heart mildly
enlarged with mitral valvular calcification.

Review of the MIP images confirms the above findings.
IMPRESSION: 1.  Moderate aorto-iliofemoral atherosclerosis without aneurysm,
dissection, or hemodynamically significant stenosis.
2.  No evidence of residual or recurrent CLL.
3.  Diffuse hepatic steatosis.
4.  Colonic diverticulosis without evidence of acute
diverticulitis.
5.  Inspissated stool-like material over a long segment of the
distal and terminal ileum, consistent with stasis.
6.  Scattered areas of focal hyperlucency in the visualized lung
bases consistent with localized air trapping as can be seen in
patients with asthma.

## 2015-04-02 ENCOUNTER — Ambulatory Visit (INDEPENDENT_AMBULATORY_CARE_PROVIDER_SITE_OTHER): Payer: Medicare HMO | Admitting: Pharmacist

## 2015-04-02 DIAGNOSIS — Z954 Presence of other heart-valve replacement: Secondary | ICD-10-CM | POA: Diagnosis not present

## 2015-04-02 DIAGNOSIS — Z5181 Encounter for therapeutic drug level monitoring: Secondary | ICD-10-CM | POA: Diagnosis not present

## 2015-04-02 DIAGNOSIS — I059 Rheumatic mitral valve disease, unspecified: Secondary | ICD-10-CM | POA: Diagnosis not present

## 2015-04-02 LAB — POCT INR: INR: 3.5

## 2015-05-14 ENCOUNTER — Ambulatory Visit (INDEPENDENT_AMBULATORY_CARE_PROVIDER_SITE_OTHER): Payer: Medicare HMO

## 2015-05-14 DIAGNOSIS — Z954 Presence of other heart-valve replacement: Secondary | ICD-10-CM | POA: Diagnosis not present

## 2015-05-14 DIAGNOSIS — Z5181 Encounter for therapeutic drug level monitoring: Secondary | ICD-10-CM | POA: Diagnosis not present

## 2015-05-14 DIAGNOSIS — I059 Rheumatic mitral valve disease, unspecified: Secondary | ICD-10-CM

## 2015-05-14 LAB — POCT INR: INR: 3

## 2015-06-10 ENCOUNTER — Other Ambulatory Visit: Payer: Self-pay | Admitting: Cardiology

## 2015-06-25 ENCOUNTER — Ambulatory Visit (INDEPENDENT_AMBULATORY_CARE_PROVIDER_SITE_OTHER): Payer: Medicare HMO | Admitting: *Deleted

## 2015-06-25 DIAGNOSIS — Z5181 Encounter for therapeutic drug level monitoring: Secondary | ICD-10-CM

## 2015-06-25 DIAGNOSIS — I059 Rheumatic mitral valve disease, unspecified: Secondary | ICD-10-CM | POA: Diagnosis not present

## 2015-06-25 DIAGNOSIS — Z954 Presence of other heart-valve replacement: Secondary | ICD-10-CM | POA: Diagnosis not present

## 2015-06-25 LAB — POCT INR: INR: 3.3

## 2015-07-08 ENCOUNTER — Ambulatory Visit (INDEPENDENT_AMBULATORY_CARE_PROVIDER_SITE_OTHER): Payer: Medicare HMO | Admitting: Cardiology

## 2015-07-08 ENCOUNTER — Encounter: Payer: Self-pay | Admitting: Cardiology

## 2015-07-08 VITALS — BP 112/60 | HR 92 | Ht 65.0 in | Wt 212.0 lb

## 2015-07-08 DIAGNOSIS — E785 Hyperlipidemia, unspecified: Secondary | ICD-10-CM

## 2015-07-08 DIAGNOSIS — R0602 Shortness of breath: Secondary | ICD-10-CM | POA: Diagnosis not present

## 2015-07-08 DIAGNOSIS — Z954 Presence of other heart-valve replacement: Secondary | ICD-10-CM

## 2015-07-08 LAB — CBC WITH DIFFERENTIAL/PLATELET
BASOS ABS: 0 {cells}/uL (ref 0–200)
Basophils Relative: 0 %
EOS ABS: 136 {cells}/uL (ref 15–500)
Eosinophils Relative: 1 %
HEMATOCRIT: 42.2 % (ref 35.0–45.0)
HEMOGLOBIN: 13.9 g/dL (ref 11.7–15.5)
LYMPHS ABS: 3808 {cells}/uL (ref 850–3900)
Lymphocytes Relative: 28 %
MCH: 27.7 pg (ref 27.0–33.0)
MCHC: 32.9 g/dL (ref 32.0–36.0)
MCV: 84.1 fL (ref 80.0–100.0)
MPV: 11.5 fL (ref 7.5–12.5)
Monocytes Absolute: 1496 cells/uL — ABNORMAL HIGH (ref 200–950)
Monocytes Relative: 11 %
NEUTROS ABS: 8160 {cells}/uL — AB (ref 1500–7800)
NEUTROS PCT: 60 %
Platelets: 258 10*3/uL (ref 140–400)
RBC: 5.02 MIL/uL (ref 3.80–5.10)
RDW: 14.9 % (ref 11.0–15.0)
WBC: 13.6 10*3/uL — ABNORMAL HIGH (ref 3.8–10.8)

## 2015-07-08 LAB — BASIC METABOLIC PANEL
BUN: 15 mg/dL (ref 7–25)
CHLORIDE: 104 mmol/L (ref 98–110)
CO2: 27 mmol/L (ref 20–31)
CREATININE: 1.01 mg/dL — AB (ref 0.50–0.99)
Calcium: 9.1 mg/dL (ref 8.6–10.4)
GLUCOSE: 98 mg/dL (ref 65–99)
POTASSIUM: 4.3 mmol/L (ref 3.5–5.3)
Sodium: 140 mmol/L (ref 135–146)

## 2015-07-08 LAB — LIPID PANEL
Cholesterol: 159 mg/dL (ref 125–200)
HDL: 52 mg/dL (ref >=46)
LDL CALC: 64 mg/dL (ref <130)
TRIGLYCERIDES: 214 mg/dL — AB (ref <150)
Total CHOL/HDL Ratio: 3.1 Ratio (ref <=5.0)
VLDL: 43 mg/dL — AB (ref <30)

## 2015-07-08 NOTE — Progress Notes (Signed)
Patient ID: Priscilla Stevens, female   DOB: 1950-11-07, 65 y.o.   MRN: [id number] PCP: Dr. Melford Aase  65 yo with history of rheumatic mitral valve disease presents after mitral valve replacement with mechanical MVR in 4/14.  She did not have significant CAD on cath prior to MVR.  Her post-op course was uneventful, no atrial fibrillation.    Stable dyspnea after walking about 50 feet.  No problems doing housework.  No PND or orthopnea. No chest pain.  Rare palpitations.  No syncope or lightheadedness.  No BRBPR or melena. No claudication.  Labs (12/14): K 4.6, creatinine 0.9 Labs (7/16): K 4.6, creatinine 0.96, BNP 45  ECG: NSR, slight nonspecific T wave changes  PMH: 1. Depression 2. Rheumatic heart disease: Patient developed moderate MS and moderate to severe MR.  4/14 he had MV replacement with #29 Sorin Carbomedics mitral valve.  Echo (5/14) with EF 55-60%, mild LVH, mechanical mitral valve with mean gradient 6 mmHg. Echo (9/16) with EF 65-70%, mechanical mitral valve, mean gradient 6 mmHg.  3. Morbid obesity 4. CLL 5. COPD 6. Fibromyalgia 7. LHC (3/14) with no significant CAD.  8. Medication-related QT prolongation  SH: Prior smoker, quit 1/14.  Married.  No ETOH.   FH: No premature CAD  ROS: All systems reviewed and negative except as per HPI.    Current Outpatient Prescriptions  Medication Sig Dispense Refill  . albuterol (PROVENTIL HFA;VENTOLIN HFA) 108 (90 BASE) MCG/ACT inhaler Inhale 2 puffs into the lungs every 6 (six) hours as needed for wheezing or shortness of breath. 1 Inhaler 0  . ALPRAZolam (XANAX) 0.25 MG tablet Take 1 tablet (0.25 mg total) by mouth at bedtime as needed for sleep or anxiety. 30 tablet 0  . amoxicillin (AMOXIL) 500 MG tablet 4 tablets (2 G) 30-60 minutes prior to dental appointment. 20 tablet 0  . calcium carbonate (OS-CAL) 600 MG TABS Take 600 mg by mouth 2 (two) times daily with a meal.     . ezetimibe (ZETIA) 10 MG tablet Take 10 mg by mouth  daily.    Marland Kitchen HYDROcodone-acetaminophen (NORCO/VICODIN) 5-325 MG per tablet Take 1 tablet by mouth every 6 (six) hours as needed for pain.    . Lansoprazole (PREVACID PO) Take 1 tablet by mouth daily. OTC    . metoprolol succinate (TOPROL-XL) 25 MG 24 hr tablet TAKE 1 TABLET TWICE DAILY 180 tablet 2  . promethazine (PHENERGAN) 25 MG tablet Take 25 mg by mouth as needed for nausea.    Marland Kitchen venlafaxine XR (EFFEXOR-XR) 37.5 MG 24 hr capsule Take 1 capsule by mouth daily.    Marland Kitchen warfarin (COUMADIN) 5 MG tablet TAKE AS DIRECTED FROM COUMADIN CLINIC 120 tablet 1   No current facility-administered medications for this visit.   BP 112/60 mmHg  Pulse 92  Ht 5\' 5"  (1.651 m)  Wt 212 lb (96.163 kg)  BMI 35.28 kg/m2 General: NAD Neck: No JVD, no thyromegaly or thyroid nodule.  Lungs: Occasional rhonchi CV: Nondisplaced PMI.  Heart regular S1/S2, mechanical S1, no S3/S4, no murmur.  No peripheral edema.  No carotid bruit.  Normal pedal pulses.  Abdomen: Soft, nontender, no hepatosplenomegaly, no distention.  Neurologic: Alert and oriented x 3.  Psych: Normal affect. Extremities: No clubbing or cyanosis.   Assessment/Plan: 1. Mechanical mitral valve: Well-seated on post-op echo in 5/14.  She will continue warfarin with goal INR 2.5-3.5.  Check CBC today.  Ideally, she would take aspirin . However, she has severe abdominal pain/burning even with  enteric coated ASA, so she will not be able to take aspirin.  Patient will need antibiotic prophylaxis with dental work.  2. COPD: Suspect stable dyspnea is due to COPD.  Will get PFTs, if significant obstruction would try Spivira.  Loralie Champagne 07/08/2015

## 2015-07-08 NOTE — Patient Instructions (Signed)
Medication Instructions:  Your physician recommends that you continue on your current medications as directed. Please refer to the Current Medication list given to you today.   Labwork: Lipid profile/BMET/CBCd today  Testing/Procedures: Your physician has recommended that you have a pulmonary function test. Pulmonary Function Tests are a group of tests that measure how well air moves in and out of your lungs.    Follow-Up: Your physician wants you to follow-up in: 6 months with Dr Aundra Dubin. (October 2017). You will receive a reminder letter in the mail two months in advance. If you don't receive a letter, please call our office to schedule the follow-up appointment.       If you need a refill on your cardiac medications before your next appointment, please call your pharmacy.

## 2015-07-09 ENCOUNTER — Other Ambulatory Visit: Payer: Self-pay | Admitting: *Deleted

## 2015-08-06 ENCOUNTER — Ambulatory Visit (INDEPENDENT_AMBULATORY_CARE_PROVIDER_SITE_OTHER): Payer: Medicare HMO | Admitting: *Deleted

## 2015-08-06 DIAGNOSIS — Z5181 Encounter for therapeutic drug level monitoring: Secondary | ICD-10-CM | POA: Diagnosis not present

## 2015-08-06 DIAGNOSIS — I059 Rheumatic mitral valve disease, unspecified: Secondary | ICD-10-CM

## 2015-08-06 DIAGNOSIS — Z954 Presence of other heart-valve replacement: Secondary | ICD-10-CM

## 2015-08-06 LAB — POCT INR: INR: 3.1

## 2015-08-13 ENCOUNTER — Other Ambulatory Visit: Payer: Self-pay | Admitting: Cardiology

## 2015-09-17 ENCOUNTER — Ambulatory Visit (INDEPENDENT_AMBULATORY_CARE_PROVIDER_SITE_OTHER): Payer: Medicare HMO | Admitting: Pharmacist

## 2015-09-17 DIAGNOSIS — Z954 Presence of other heart-valve replacement: Secondary | ICD-10-CM | POA: Diagnosis not present

## 2015-09-17 DIAGNOSIS — I059 Rheumatic mitral valve disease, unspecified: Secondary | ICD-10-CM

## 2015-09-17 DIAGNOSIS — Z5181 Encounter for therapeutic drug level monitoring: Secondary | ICD-10-CM | POA: Diagnosis not present

## 2015-09-17 LAB — POCT INR: INR: 3.8

## 2015-10-21 ENCOUNTER — Ambulatory Visit (INDEPENDENT_AMBULATORY_CARE_PROVIDER_SITE_OTHER): Payer: Medicare HMO | Admitting: *Deleted

## 2015-10-21 DIAGNOSIS — I059 Rheumatic mitral valve disease, unspecified: Secondary | ICD-10-CM

## 2015-10-21 DIAGNOSIS — Z5181 Encounter for therapeutic drug level monitoring: Secondary | ICD-10-CM

## 2015-10-21 DIAGNOSIS — Z954 Presence of other heart-valve replacement: Secondary | ICD-10-CM

## 2015-10-21 LAB — POCT INR: INR: 3.7

## 2015-11-01 ENCOUNTER — Encounter (INDEPENDENT_AMBULATORY_CARE_PROVIDER_SITE_OTHER): Payer: Medicare HMO | Admitting: Internal Medicine

## 2015-11-01 DIAGNOSIS — E785 Hyperlipidemia, unspecified: Secondary | ICD-10-CM

## 2015-11-01 DIAGNOSIS — Z954 Presence of other heart-valve replacement: Secondary | ICD-10-CM

## 2015-11-01 DIAGNOSIS — R0602 Shortness of breath: Secondary | ICD-10-CM

## 2015-11-01 LAB — PULMONARY FUNCTION TEST
DL/VA % PRED: 85 %
DL/VA: 4.24 ml/min/mmHg/L
DLCO COR: 16.1 ml/min/mmHg
DLCO UNC % PRED: 61 %
DLCO UNC: 16.2 ml/min/mmHg
DLCO cor % pred: 61 %
FEF 25-75 PRE: 0.72 L/s
FEF 25-75 Post: 1.19 L/sec
FEF2575-%CHANGE-POST: 66 %
FEF2575-%PRED-POST: 51 %
FEF2575-%PRED-PRE: 30 %
FEV1-%Change-Post: 18 %
FEV1-%PRED-POST: 53 %
FEV1-%PRED-PRE: 45 %
FEV1-PRE: 1.19 L
FEV1-Post: 1.41 L
FEV1FVC-%CHANGE-POST: -2 %
FEV1FVC-%Pred-Pre: 87 %
FEV6-%CHANGE-POST: 22 %
FEV6-%PRED-POST: 64 %
FEV6-%Pred-Pre: 52 %
FEV6-Post: 2.12 L
FEV6-Pre: 1.73 L
FEV6FVC-%CHANGE-POST: -1 %
FEV6FVC-%Pred-Post: 103 %
FEV6FVC-%Pred-Pre: 104 %
FVC-%Change-Post: 20 %
FVC-%Pred-Post: 62 %
FVC-%Pred-Pre: 51 %
FVC-Post: 2.14 L
FVC-Pre: 1.77 L
POST FEV1/FVC RATIO: 66 %
POST FEV6/FVC RATIO: 99 %
PRE FEV1/FVC RATIO: 67 %
Pre FEV6/FVC Ratio: 100 %
RV % pred: 115 %
RV: 2.44 L
TLC % pred: 81 %
TLC: 4.33 L

## 2015-11-04 ENCOUNTER — Ambulatory Visit (INDEPENDENT_AMBULATORY_CARE_PROVIDER_SITE_OTHER): Payer: Medicare HMO | Admitting: *Deleted

## 2015-11-04 DIAGNOSIS — Z954 Presence of other heart-valve replacement: Secondary | ICD-10-CM | POA: Diagnosis not present

## 2015-11-04 DIAGNOSIS — I059 Rheumatic mitral valve disease, unspecified: Secondary | ICD-10-CM | POA: Diagnosis not present

## 2015-11-04 DIAGNOSIS — Z5181 Encounter for therapeutic drug level monitoring: Secondary | ICD-10-CM | POA: Diagnosis not present

## 2015-11-04 LAB — POCT INR: INR: 2.7

## 2015-11-13 ENCOUNTER — Telehealth: Payer: Self-pay | Admitting: Cardiology

## 2015-11-13 NOTE — Telephone Encounter (Signed)
Reviewed results of PFT's with pt.  She is aware of severe obstructive disease.  Aware I will ask if any new orders based on this result and call back once any recomendations are given.

## 2015-11-13 NOTE — Telephone Encounter (Signed)
New message       Returning a call to the nurse to get PFT results.  Please call after 10am

## 2015-11-14 ENCOUNTER — Other Ambulatory Visit: Payer: Self-pay | Admitting: *Deleted

## 2015-11-14 MED ORDER — TIOTROPIUM BROMIDE MONOHYDRATE 18 MCG IN CAPS: 18.0000 ug | ORAL_CAPSULE | Freq: Every day | RESPIRATORY_TRACT | 2 refills | Status: DC

## 2015-11-14 NOTE — Telephone Encounter (Signed)
SEE PFT RESULTS NOTE .Priscilla Stevens

## 2015-11-26 ENCOUNTER — Ambulatory Visit (INDEPENDENT_AMBULATORY_CARE_PROVIDER_SITE_OTHER): Payer: Medicare HMO | Admitting: *Deleted

## 2015-11-26 DIAGNOSIS — Z5181 Encounter for therapeutic drug level monitoring: Secondary | ICD-10-CM | POA: Diagnosis not present

## 2015-11-26 DIAGNOSIS — Z954 Presence of other heart-valve replacement: Secondary | ICD-10-CM

## 2015-11-26 DIAGNOSIS — I059 Rheumatic mitral valve disease, unspecified: Secondary | ICD-10-CM

## 2015-11-26 LAB — POCT INR: INR: 2.3

## 2015-12-19 ENCOUNTER — Ambulatory Visit (INDEPENDENT_AMBULATORY_CARE_PROVIDER_SITE_OTHER): Payer: Medicare HMO | Admitting: *Deleted

## 2015-12-19 DIAGNOSIS — I059 Rheumatic mitral valve disease, unspecified: Secondary | ICD-10-CM | POA: Diagnosis not present

## 2015-12-19 DIAGNOSIS — Z5181 Encounter for therapeutic drug level monitoring: Secondary | ICD-10-CM | POA: Diagnosis not present

## 2015-12-19 DIAGNOSIS — Z954 Presence of other heart-valve replacement: Secondary | ICD-10-CM

## 2015-12-19 LAB — POCT INR: INR: 2.2

## 2016-01-02 ENCOUNTER — Ambulatory Visit (INDEPENDENT_AMBULATORY_CARE_PROVIDER_SITE_OTHER): Payer: Medicare HMO | Admitting: *Deleted

## 2016-01-02 DIAGNOSIS — Z5181 Encounter for therapeutic drug level monitoring: Secondary | ICD-10-CM | POA: Diagnosis not present

## 2016-01-02 DIAGNOSIS — Z954 Presence of other heart-valve replacement: Secondary | ICD-10-CM | POA: Diagnosis not present

## 2016-01-02 DIAGNOSIS — I059 Rheumatic mitral valve disease, unspecified: Secondary | ICD-10-CM | POA: Diagnosis not present

## 2016-01-02 LAB — POCT INR: INR: 2.5

## 2016-01-07 ENCOUNTER — Ambulatory Visit (INDEPENDENT_AMBULATORY_CARE_PROVIDER_SITE_OTHER): Payer: Medicare HMO | Admitting: *Deleted

## 2016-01-07 DIAGNOSIS — Z954 Presence of other heart-valve replacement: Secondary | ICD-10-CM | POA: Diagnosis not present

## 2016-01-07 DIAGNOSIS — I059 Rheumatic mitral valve disease, unspecified: Secondary | ICD-10-CM | POA: Diagnosis not present

## 2016-01-07 DIAGNOSIS — Z5181 Encounter for therapeutic drug level monitoring: Secondary | ICD-10-CM

## 2016-01-07 LAB — POCT INR: INR: 2.3

## 2016-01-09 ENCOUNTER — Other Ambulatory Visit: Payer: Self-pay | Admitting: Cardiology

## 2016-01-28 ENCOUNTER — Ambulatory Visit (INDEPENDENT_AMBULATORY_CARE_PROVIDER_SITE_OTHER): Payer: Medicare HMO | Admitting: *Deleted

## 2016-01-28 DIAGNOSIS — I059 Rheumatic mitral valve disease, unspecified: Secondary | ICD-10-CM

## 2016-01-28 DIAGNOSIS — Z5181 Encounter for therapeutic drug level monitoring: Secondary | ICD-10-CM

## 2016-01-28 DIAGNOSIS — Z954 Presence of other heart-valve replacement: Secondary | ICD-10-CM | POA: Diagnosis not present

## 2016-01-28 LAB — POCT INR: INR: 1.7

## 2016-02-10 ENCOUNTER — Encounter: Payer: Self-pay | Admitting: Physician Assistant

## 2016-02-11 ENCOUNTER — Ambulatory Visit (INDEPENDENT_AMBULATORY_CARE_PROVIDER_SITE_OTHER): Payer: Medicare HMO

## 2016-02-11 ENCOUNTER — Ambulatory Visit
Admission: RE | Admit: 2016-02-11 | Discharge: 2016-02-11 | Disposition: A | Discharge status: Home / Self Care | Payer: Medicare HMO | Source: Ambulatory Visit | Attending: Physician Assistant | Admitting: Physician Assistant

## 2016-02-11 ENCOUNTER — Encounter: Payer: Self-pay | Admitting: Physician Assistant

## 2016-02-11 ENCOUNTER — Ambulatory Visit (INDEPENDENT_AMBULATORY_CARE_PROVIDER_SITE_OTHER): Payer: Medicare HMO | Admitting: Physician Assistant

## 2016-02-11 VITALS — BP 142/80 | HR 74 | Ht 65.0 in | Wt 210.1 lb

## 2016-02-11 DIAGNOSIS — Z5181 Encounter for therapeutic drug level monitoring: Secondary | ICD-10-CM | POA: Diagnosis not present

## 2016-02-11 DIAGNOSIS — J449 Chronic obstructive pulmonary disease, unspecified: Secondary | ICD-10-CM | POA: Diagnosis not present

## 2016-02-11 DIAGNOSIS — I059 Rheumatic mitral valve disease, unspecified: Secondary | ICD-10-CM

## 2016-02-11 DIAGNOSIS — R059 Cough, unspecified: Secondary | ICD-10-CM

## 2016-02-11 DIAGNOSIS — Z954 Presence of other heart-valve replacement: Secondary | ICD-10-CM

## 2016-02-11 DIAGNOSIS — R05 Cough: Secondary | ICD-10-CM | POA: Diagnosis not present

## 2016-02-11 LAB — POCT INR: INR: 2

## 2016-02-11 MED ORDER — BENZONATATE 100 MG PO CAPS: 100.0000 mg | ORAL_CAPSULE | Freq: Three times a day (TID) | ORAL | 0 refills | Status: DC | PRN

## 2016-02-11 NOTE — Patient Instructions (Addendum)
Medication Instructions:  Tessalon Perles 100 mg Three times a day as needed for cough. You can take the Albuterol around the clock every 4-6 hours for 2-3 days.  Labwork: None today.  Testing/Procedures: I would like you to go get a chest X-ray ; THIS IS TO BE DONE TODAY AT [facility]  Follow-Up: Richardson Dopp, PA-C or Dr. Harrell Gave End in 6 months.  Any Other Special Instructions Will Be Listed Below (If Applicable). Schedule follow up with your primary care doctor this week for your cough.  He should be able to pull up the chest X-ray in the computer.  If you need a refill on your cardiac medications before your next appointment, please call your pharmacy.

## 2016-02-11 NOTE — Progress Notes (Signed)
She can see Dr. Saunders Revel.

## 2016-02-11 NOTE — Progress Notes (Signed)
Cardiology Office Note:    Date:  02/11/2016   ID:  Priscilla Stevens, DOB 1950-09-22, MRN [id number]  PCP:  Chesley Noon, MD  Cardiologist:  Dr. Loralie Champagne   Electrophysiologist:  n/a  Referring MD: Chesley Noon, MD   Chief Complaint  Patient presents with  . Follow-up    s/p MVR    History of Present Illness:    Priscilla Stevens is a 65 y.o. female with a hx of  rheumatic mitral valve disease s/p mitral valve replacement with mechanical MVR in 4/14. She did not have significant CAD on cath prior to MVR.  Of note she is not able to take ASA due to GI side effects.  PFTs prior to her valve surgery demonstrated mixed obstructive and restrictive lung defect with FEV1 49% predicted. She did have positive response to bronchodilator.  Last seen by Dr. Loralie Champagne in 4/17.  PFTs were arranged and demonstrated severe obstructive airways disease suggestive of emphysema.    She returns for FU.  Overall, she is doing well. She denies chest pain, significant dyspnea, orthopnea, PND or edema. She denies syncope. She denies a bleeding issues. Her biggest complaint is that of a cough. This has been fairly chronic over the last several weeks. Her primary care doctor has placed her on antibiotics as well as prednisone. Her cough did improve for a while but then returned. There is not much production. She denies hemoptysis. She denies high fevers.   Prior CV studies that were reviewed today include:    PFTs 7/17 FEV1 45% predicted; FEV1/FVC 67%, uncorr DLCO 61% predicted >> Severe obstructive airways disease suggestive of emphysema.  Echo 9/16 Vigorous LVF, EF 65-70, mechanical MVR okay with peak and mean gradients 12 and 6 mmHg respectively, mild LAE  Echo 08/2012 Mild LVH, EF 55-60, trivial AI, mechanical MVR ok with Peak and meangradients across valve are 9 and 6 mm Hg respectivelywhich is normal, mild LAE   Carotid US 06/2012 No significant extracranial carotid artery  stenosisdemonstrated.  LHC 06/2012 Coronary angiography: Coronary dominance: right Left mainstem: No significant disease.  Left anterior descending (LAD): No significant disease.  Left circumflex (LCx): 30% ostial LCx.  Right coronary artery (RCA): No significant disease.  Left ventriculography: Left ventricular systolic function is normal, LVEF is estimated at 60-65% with no wall motion abnormalities in the RAO projection. 3+ mitral regurgitation.    Past Medical History:  Diagnosis Date  . Acute on chronic diastolic heart failure ([place]) 06/22/2012  . Anxiety   . CHF (congestive heart failure) ([place])   . Chronic diastolic congestive heart failure ([place]) 07/07/2012  . CLL (chronic lymphocytic leukemia) ([place])   . Complication of anesthesia   . Cough    Eval by pulm in past: essentially normal PFTs, cough ddx was variant asthma, upper airway cough syndrome (previously termed post nasal drip syndrome) or GERD,   . Diverticulitis   . Dysrhythmia    FAST HB TAKING METOPROLOL NOT FOR HTN  . Fibromyalgia   . GERD (gastroesophageal reflux disease)   . Headache(784.0)    HX MIGRAINES   . Heart murmur   . History of echocardiogram    Echo 9/16:  Vigorous LVF, EF 65-70%, MVR ok (peak 12 mmHg, mean 6 mmHg), mild LAE, normal RVF.  Marland Kitchen History of tobacco abuse 06/22/2012  . Mitral stenosis 06/20/2012  . Morbid obesity ([place]) 06/20/2012  . Pneumonia    2-3 YRS AGO   . PONV (postoperative nausea and vomiting)   .  S/P mitral valve replacement with metallic valve [id number]   29 mm Sorin Carbomedics mechanical valve  . Severe mitral regurgitation 06/20/2012  1. Depression 2. Rheumatic heart disease: Patient developed moderate MS and moderate to severe MR.  4/14 he had MV replacement with #29 Sorin Carbomedics mitral valve.  Echo (5/14) with EF 55-60%, mild LVH, mechanical mitral valve with mean gradient 6 mmHg. Echo (9/16) with EF 65-70%, mechanical mitral valve, mean gradient 6 mmHg.  3. Morbid  obesity 4. CLL 5. COPD 6. Fibromyalgia 7. LHC (3/14) with no significant CAD.  8. Medication-related QT prolongation   Past Surgical History:  Procedure Laterality Date  . APPENDECTOMY    . INTRAOPERATIVE TRANSESOPHAGEAL ECHOCARDIOGRAM N/A 07/07/2012   Procedure: INTRAOPERATIVE TRANSESOPHAGEAL ECHOCARDIOGRAM;  Surgeon: Rexene Alberts, MD;  Location: [facility];  Service: Open Heart Surgery;  Laterality: N/A;  . LEFT AND RIGHT HEART CATHETERIZATION WITH CORONARY ANGIOGRAM N/A 06/21/2012   Procedure: LEFT AND RIGHT HEART CATHETERIZATION WITH CORONARY ANGIOGRAM;  Surgeon: Larey Dresser, MD;  [facility];  Service: Cardiovascular;  Laterality: N/A;  . MITRAL VALVE REPLACEMENT N/A 07/07/2012   Procedure: MITRAL VALVE (MV) REPLACEMENT;  Surgeon: Rexene Alberts, MD;  Location: [facility];  Service: Open Heart Surgery;  Laterality: N/A;  . NO PAST SURGERIES     TUBAL PREG   . TEE WITHOUT CARDIOVERSION N/A 06/22/2012   Procedure: TRANSESOPHAGEAL ECHOCARDIOGRAM (TEE);  Surgeon: Larey Dresser, MD;  Location: [facility];  Service: Cardiovascular;  Laterality: N/A;  . TONSILLECTOMY    . TOTAL VAGINAL HYSTERECTOMY      Current Medications: Current Meds  Medication Sig  . albuterol (PROVENTIL HFA;VENTOLIN HFA) 108 (90 BASE) MCG/ACT inhaler Inhale 2 puffs into the lungs every 6 (six) hours as needed for wheezing or shortness of breath.  . ALPRAZolam (XANAX) 0.25 MG tablet Take 1 tablet (0.25 mg total) by mouth at bedtime as needed for sleep or anxiety.  Marland Kitchen amoxicillin (AMOXIL) 500 MG tablet 4 tablets (2 G) 30-60 minutes prior to dental appointment.  . calcium carbonate (OS-CAL) 600 MG TABS Take 600 mg by mouth 2 (two) times daily with a meal.   . ezetimibe (ZETIA) 10 MG tablet Take 10 mg by mouth daily.  Marland Kitchen HYDROcodone-acetaminophen (NORCO/VICODIN) 5-325 MG per tablet Take 1 tablet by mouth every 6 (six) hours as needed for pain.  . hydroxychloroquine (PLAQUENIL) 200 MG tablet Take 200 mg by mouth  2 (two) times daily.  . Lansoprazole (PREVACID PO) Take 1 tablet by mouth daily. OTC  . metoprolol succinate (TOPROL-XL) 25 MG 24 hr tablet Take 1 tablet (25 mg total) by mouth 2 (two) times daily.  . Omega-3 Fatty Acids (FISH OIL) 1000 MG CAPS 2 (2000mg ) two times a day  . promethazine (PHENERGAN) 25 MG tablet Take 25 mg by mouth as needed for nausea.  Marland Kitchen venlafaxine XR (EFFEXOR-XR) 37.5 MG 24 hr capsule Take 1 capsule by mouth daily.  Marland Kitchen warfarin (COUMADIN) 5 MG tablet TAKE AS DIRECTED FROM COUMADIN CLINIC     Allergies:   Statins and Aspirin   Social History   Social History  . Marital status: Married    Spouse name: N/A  . Number of children: N/A  . Years of education: N/A   Occupational History  . disabled    Social History Main Topics  . Smoking status: Former Smoker    Packs/day: 1.00    Years: 30.00    Quit date: 04/06/2012  . Smokeless tobacco: Never Used  Comment: Quit January 2014. Smoked for 25 yrs.  . Alcohol use 0.0 oz/week     Comment: 2x/year  . Drug use: No  . Sexual activity: Not Currently   Other Topics Concern  . None   Social History Narrative   Sedentary lifestyle     Family History:  The patient's family history includes Cancer - Lung in her father; Congestive Heart Failure in her mother.   ROS:   Please see the history of present illness.    ROS All other systems reviewed and are negative.   EKGs/Labs/Other Test Reviewed:    EKG:  EKG is  ordered today.  The ekg ordered today demonstrates NSR, HR 74, normal axis, QTC 490 ms, no significant changes prior tracing  Recent Labs: 07/08/2015: BUN 15; Creat 1.01; Hemoglobin 13.9; Platelets 258; Potassium 4.3; Sodium 140   Recent Lipid Panel    Component Value Date/Time   CHOL 159 07/08/2015 1510   TRIG 214 (H) 07/08/2015 1510   HDL 52 07/08/2015 1510   CHOLHDL 3.1 07/08/2015 1510   VLDL 43 (H) 07/08/2015 1510   LDLCALC 64 07/08/2015 1510     Physical Exam:    VS:  BP (!) 142/80    Pulse 74   Ht 5\' 5"  (1.651 m)   Wt 210 lb 1.9 oz (95.3 kg)   BMI 34.97 kg/m     Wt Readings from Last 3 Encounters:  02/11/16 210 lb 1.9 oz (95.3 kg)  07/08/15 212 lb (96.2 kg)  10/19/14 205 lb 12.8 oz (93.4 kg)     Physical Exam  Constitutional: She is oriented to person, place, and time. She appears well-developed and well-nourished. No distress.  HENT:  Head: Normocephalic and atraumatic.  Eyes: No scleral icterus.  Neck: No JVD present.  Cardiovascular: Normal rate, regular rhythm and S2 normal.   No murmur heard. Mechanical S1  Pulmonary/Chest: She has decreased breath sounds. She has wheezes in the right middle field, the right lower field, the left middle field and the left lower field. She has no rales.  Abdominal: Soft. There is no tenderness.  Musculoskeletal: She exhibits no edema.  Neurological: She is alert and oriented to person, place, and time.  Skin: Skin is warm and dry.  Psychiatric: She has a normal mood and affect.    ASSESSMENT:    1. S/P mitral valve replacement with metallic valve   2. Cough   3. Chronic obstructive pulmonary disease, unspecified COPD type ([place])    PLAN:    In order of problems listed above:  1. S/p MVR - Mechanical MVR in 2014.  She is unable to tol ASA due to GI side effects.  She is on Warfarin with a goal INR of 2.5-3.5.  Echo in 12/2014 with stable MVR.  Her valve sounds are good.    -  Continue Warfarin  -  Continue SBE prophylaxis.    2. Cough - Likely related to COPD.  She needs FU with her PCP.  She will see him this week.  I will send her for a chest X-ray.  She can use her Albuterol around the clock q 4-6 hours for 2-3 days.  I will give her Tessalon perles 100 mg TID prn.    3. COPD - She may benefit from LABA+inhaled steroid.  Will defer to PCP.  She no longer takes Spiriva as she had a worse cough with this medication.    Medication Adjustments/Labs and Tests Ordered: Current medicines are reviewed at  length with  the patient today.  Concerns regarding medicines are outlined above.  Medication changes, Labs and Tests ordered today are outlined in the Patient Instructions noted below. Patient Instructions  Medication Instructions:  Tessalon Perles 100 mg Three times a day as needed for cough. You can take the Albuterol around the clock every 4-6 hours for 2-3 days.  Labwork: None today.  Testing/Procedures: I would like you to go get a chest X-ray ; THIS IS TO BE DONE TODAY AT [facility]  Follow-Up: Richardson Dopp, PA-C or Dr. Harrell Gave End in 6 months.  Any Other Special Instructions Will Be Listed Below (If Applicable). Schedule follow up with your primary care doctor this week for your cough.  He should be able to pull up the chest X-ray in the computer.  If you need a refill on your cardiac medications before your next appointment, please call your pharmacy.   Signed, Richardson Dopp, PA-C  02/11/2016 [facility] [address], [place], [place]  [address] Phone: [phone]; Fax: [phone]

## 2016-02-14 ENCOUNTER — Telehealth: Payer: Self-pay | Admitting: *Deleted

## 2016-02-14 NOTE — Telephone Encounter (Signed)
Pt notified per Dr. Aundra Dubin pt will f/u in our office with Dr. Saunders Revel or Richardson Dopp, PA. Pt is agreeable to plan of care. Recall placed for 6 month f/u with Dr. Saunders Revel.

## 2016-02-25 ENCOUNTER — Ambulatory Visit (INDEPENDENT_AMBULATORY_CARE_PROVIDER_SITE_OTHER): Payer: Medicare HMO | Admitting: *Deleted

## 2016-02-25 DIAGNOSIS — I059 Rheumatic mitral valve disease, unspecified: Secondary | ICD-10-CM | POA: Diagnosis not present

## 2016-02-25 DIAGNOSIS — Z5181 Encounter for therapeutic drug level monitoring: Secondary | ICD-10-CM | POA: Diagnosis not present

## 2016-02-25 DIAGNOSIS — Z954 Presence of other heart-valve replacement: Secondary | ICD-10-CM | POA: Diagnosis not present

## 2016-02-25 LAB — POCT INR: INR: 2.1

## 2016-03-06 ENCOUNTER — Ambulatory Visit (INDEPENDENT_AMBULATORY_CARE_PROVIDER_SITE_OTHER): Payer: Medicare HMO | Admitting: *Deleted

## 2016-03-06 DIAGNOSIS — I059 Rheumatic mitral valve disease, unspecified: Secondary | ICD-10-CM | POA: Diagnosis not present

## 2016-03-06 DIAGNOSIS — Z5181 Encounter for therapeutic drug level monitoring: Secondary | ICD-10-CM | POA: Diagnosis not present

## 2016-03-06 DIAGNOSIS — Z954 Presence of other heart-valve replacement: Secondary | ICD-10-CM | POA: Diagnosis not present

## 2016-03-06 LAB — POCT INR: INR: 2.9

## 2016-03-20 ENCOUNTER — Ambulatory Visit (INDEPENDENT_AMBULATORY_CARE_PROVIDER_SITE_OTHER): Payer: Medicare HMO | Admitting: *Deleted

## 2016-03-20 DIAGNOSIS — Z954 Presence of other heart-valve replacement: Secondary | ICD-10-CM

## 2016-03-20 DIAGNOSIS — Z5181 Encounter for therapeutic drug level monitoring: Secondary | ICD-10-CM | POA: Diagnosis not present

## 2016-03-20 DIAGNOSIS — I059 Rheumatic mitral valve disease, unspecified: Secondary | ICD-10-CM

## 2016-03-20 LAB — POCT INR: INR: 2.4

## 2016-04-03 ENCOUNTER — Ambulatory Visit (INDEPENDENT_AMBULATORY_CARE_PROVIDER_SITE_OTHER): Payer: Medicare HMO | Admitting: *Deleted

## 2016-04-03 DIAGNOSIS — I059 Rheumatic mitral valve disease, unspecified: Secondary | ICD-10-CM

## 2016-04-03 DIAGNOSIS — Z5181 Encounter for therapeutic drug level monitoring: Secondary | ICD-10-CM | POA: Diagnosis not present

## 2016-04-03 DIAGNOSIS — Z954 Presence of other heart-valve replacement: Secondary | ICD-10-CM

## 2016-04-03 LAB — POCT INR: INR: 3.1

## 2016-04-28 ENCOUNTER — Ambulatory Visit (INDEPENDENT_AMBULATORY_CARE_PROVIDER_SITE_OTHER): Payer: Medicare HMO | Admitting: Pharmacist

## 2016-04-28 DIAGNOSIS — I059 Rheumatic mitral valve disease, unspecified: Secondary | ICD-10-CM | POA: Diagnosis not present

## 2016-04-28 DIAGNOSIS — Z954 Presence of other heart-valve replacement: Secondary | ICD-10-CM | POA: Diagnosis not present

## 2016-04-28 DIAGNOSIS — Z5181 Encounter for therapeutic drug level monitoring: Secondary | ICD-10-CM

## 2016-04-28 LAB — POCT INR: INR: 3.1

## 2016-05-13 ENCOUNTER — Other Ambulatory Visit: Payer: Self-pay | Admitting: Cardiology

## 2016-05-19 ENCOUNTER — Institutional Professional Consult (permissible substitution): Payer: Medicare HMO | Admitting: Internal Medicine

## 2016-05-26 ENCOUNTER — Ambulatory Visit (INDEPENDENT_AMBULATORY_CARE_PROVIDER_SITE_OTHER): Payer: Medicare HMO | Admitting: Internal Medicine

## 2016-05-26 ENCOUNTER — Encounter: Payer: Self-pay | Admitting: Internal Medicine

## 2016-05-26 ENCOUNTER — Ambulatory Visit (INDEPENDENT_AMBULATORY_CARE_PROVIDER_SITE_OTHER): Payer: Medicare HMO | Admitting: *Deleted

## 2016-05-26 VITALS — BP 126/84 | HR 68 | Ht 65.0 in | Wt 214.0 lb

## 2016-05-26 DIAGNOSIS — I059 Rheumatic mitral valve disease, unspecified: Secondary | ICD-10-CM | POA: Diagnosis not present

## 2016-05-26 DIAGNOSIS — J449 Chronic obstructive pulmonary disease, unspecified: Secondary | ICD-10-CM | POA: Insufficient documentation

## 2016-05-26 DIAGNOSIS — Z5181 Encounter for therapeutic drug level monitoring: Secondary | ICD-10-CM

## 2016-05-26 DIAGNOSIS — Z954 Presence of other heart-valve replacement: Secondary | ICD-10-CM

## 2016-05-26 LAB — POCT INR: INR: 3.8

## 2016-05-26 MED ORDER — BUDESONIDE-FORMOTEROL FUMARATE 80-4.5 MCG/ACT IN AERO: 2.0000 | INHALATION_SPRAY | Freq: Two times a day (BID) | RESPIRATORY_TRACT | 11 refills | Status: DC

## 2016-05-26 MED ORDER — FAMOTIDINE 20 MG PO TABS: ORAL_TABLET | ORAL | 2 refills | Status: DC

## 2016-05-26 MED ORDER — PANTOPRAZOLE SODIUM 40 MG PO TBEC: 40.0000 mg | DELAYED_RELEASE_TABLET | Freq: Every day | ORAL | 2 refills | Status: DC

## 2016-05-26 MED ORDER — BUDESONIDE-FORMOTEROL FUMARATE 80-4.5 MCG/ACT IN AERO: 2.0000 | INHALATION_SPRAY | Freq: Two times a day (BID) | RESPIRATORY_TRACT | 0 refills | Status: DC

## 2016-05-26 NOTE — Progress Notes (Signed)
Subjective:     Patient ID: Priscilla Stevens, female   DOB: Aug 22, 1950,   MRN: [id number]  HPI  33 yowf quit smoking 2015 with pattern of severe coughing with colds dating back to childhood up to 4 x year seemed to grow out of around middle school then started back again in her late 55's with GOLD II copd/ab dx'd  11/01/15 tried on spiriva made the cough worse referred to pulmonary clinic 05/26/2016 by Dr   Melford Aase   05/26/2016 1st [facility] office visit/ [facility]   Chief Complaint  Patient presents with  . Pulmonary Consult    Referred by Dr. Melford Aase for COPD.  Pt states she was seen by PCP for facial pain, and when the PA listened to her lungs she was concerned. The pt denies any SOB but states "I have always had a cough"- bothersome only when she gets a cold.    main problem has been the cough "all her life" highly variable almost always coughs in church and at hs Neb may have helped at its worst but didn't flare off it > just back to the usual cough x decades   No am flares / better sleeping at 30 degrees  After zyrtec x one week did better "it finally worked" and maint on it ever since  Not limited by breathing from desired activities  But not very active   No obvious day to day or daytime variability or assoc excess/ purulent sputum or mucus plugs or hemoptysis or cp or chest tightness, subjective wheeze or overt sinus or hb symptoms. No unusual exp hx or h/o childhood pna/ asthma or knowledge of premature birth.  Sleeping ok at 30 degrees without nocturnal  or early am exacerbation  of respiratory  c/o's or need for noct saba. Also denies any obvious fluctuation of symptoms with weather or environmental changes or other aggravating or alleviating factors except as outlined above   Current Medications, Allergies, Complete Past Medical History, Past Surgical History, Family History, and Social History were reviewed in [employer] record.  ROS  The following are  not active complaints unless bolded sore throat, dysphagia, dental problems, itching, sneezing,  nasal congestion or excess/ purulent secretions, ear ache,   fever, chills, sweats, unintended wt loss, classically pleuritic or exertional cp,  orthopnea pnd or leg swelling, presyncope, palpitations, abdominal pain, anorexia, nausea, vomiting, diarrhea  or change in bowel or bladder habits, change in stools or urine, dysuria,hematuria,  rash, arthralgias, visual complaints, headache, numbness, weakness or ataxia or problems with walking or coordination,  change in mood/affect or memory.                Review of Systems     Objective:   Physical Exam    amb obese wf nad   Wt Readings from Last 3 Encounters:  05/26/16 214 lb (97.1 kg)  02/11/16 210 lb 1.9 oz (95.3 kg)  07/08/15 212 lb (96.2 kg)    Vital signs reviewed  - Note on arrival 02 sats  96% on RA     HEENT: nl dentition, turbinates bilaterally, and oropharynx. Nl external ear canals without cough reflex   NECK :  without JVD/Nodes/TM/ nl carotid upstrokes bilaterally   LUNGS: no acc muscle use,  Nl contour chest with bilateral  mid exp wheeze / cough    CV:  RRR  no s3 or murmur or increase in P2, and no edema   ABD:  soft and nontender with  nl inspiratory excursion in the supine position. No bruits or organomegaly appreciated, bowel sounds nl  MS:  Nl gait/ ext warm without deformities, calf tenderness, cyanosis or clubbing No obvious joint restrictions   SKIN: warm and dry without lesions    NEURO:  alert, approp, nl sensorium with  no motor or cerebellar deficits apparent.      I personally reviewed images and agree with radiology impression as follows:  CXR:   02/11/16 Mild perihilar bronchitic changes. No infiltrate or pulmonary edema.       Assessment:

## 2016-05-26 NOTE — Assessment & Plan Note (Signed)
Body mass index is 35.61 kg/m.  trending up  Lab Results  Component Value Date   TSH 1.812 06/20/2012     Contributing to gerd risk/ doe/reviewed the need and the process to achieve and maintain neg calorie balance > defer f/u primary care including intermittently monitoring thyroid status

## 2016-05-26 NOTE — Assessment & Plan Note (Signed)
PFT's   11/01/15  FEV1 1.41 (53 % ) ratio 66  p 18 % improvement from saba p nothing prior to study with DLCO  61 % corrects to 85 % for alv volume   - 05/26/2016  After extensive coaching HFA effectiveness =    75%  > try symbicort 80 2bid    Interestingly cough is much worse than sob typical of a pt with uacs so need to first try low dose ics (note high doses of ics and certainly DPI rx not indicated in this setting and may be why spiriva failed) combined with max rx for GERD then regroup in 6 weeks   Total time devoted to counseling  > 50 % of initial 60 min office visit:  review case with pt/ discussion of options/alternatives/ personally creating written customized instructions  in presence of pt  then going over those specific  Instructions directly with the pt including how to use all of the meds but in particular covering each new medication in detail and the difference between the maintenance= "automatic" meds and the prns using an action plan format for the latter (If this problem/symptom => do that organization reading Left to right).  Please see AVS from this visit for a full list of these instructions which I personally wrote for this pt and  are unique to this visit.

## 2016-05-26 NOTE — Patient Instructions (Addendum)
Symbicort 80 Take 2 puffs first thing in am and then another 2 puffs about 12 hours later   Work on inhaler technique:  relax and gently blow all the way out then take a nice smooth deep breath back in, triggering the inhaler at same time you start breathing in.  Hold for up to 5 seconds if you can. Blow out thru nose. Rinse and gargle with water when done   Pantoprazole (protonix) 40 mg   Take  30-60 min before first meal of the day and Pepcid (famotidine)  20 mg one @  bedtime until return to office - this is the best way to tell whether stomach acid is contributing to your problem.    GERD (REFLUX)  is an extremely common cause of respiratory symptoms just like yours , many times with no obvious heartburn at all.    It can be treated with medication, but also with lifestyle changes including elevation of the head of your bed (ideally with 6 inch  bed blocks),  Smoking cessation, avoidance of late meals, excessive alcohol, and avoid fatty foods, chocolate, peppermint, colas, red wine, and acidic juices such as orange juice.  NO MINT OR MENTHOL PRODUCTS SO NO COUGH DROPS   USE SUGARLESS CANDY INSTEAD (Jolley ranchers or Stover's or Life Savers) or even ice chips will also do - the key is to swallow to prevent all throat clearing. NO OIL BASED VITAMINS - use powdered substitutes.   For drainage / throat tickle try take CHLORPHENIRAMINE  4 mg - take one every 4 hours as needed - available over the counter- may cause drowsiness so start with just a bedtime dose or two and see how you tolerate it  Ok to continue zyrtec as needed in am  Please schedule a follow up office visit in 6 weeks, call sooner if needed

## 2016-06-22 ENCOUNTER — Other Ambulatory Visit: Payer: Self-pay | Admitting: Internal Medicine

## 2016-06-23 ENCOUNTER — Ambulatory Visit (INDEPENDENT_AMBULATORY_CARE_PROVIDER_SITE_OTHER): Payer: Medicare HMO | Admitting: *Deleted

## 2016-06-23 DIAGNOSIS — Z5181 Encounter for therapeutic drug level monitoring: Secondary | ICD-10-CM

## 2016-06-23 DIAGNOSIS — Z954 Presence of other heart-valve replacement: Secondary | ICD-10-CM | POA: Diagnosis not present

## 2016-06-23 DIAGNOSIS — I059 Rheumatic mitral valve disease, unspecified: Secondary | ICD-10-CM | POA: Diagnosis not present

## 2016-06-23 LAB — POCT INR: INR: 3.8

## 2016-07-07 ENCOUNTER — Ambulatory Visit (INDEPENDENT_AMBULATORY_CARE_PROVIDER_SITE_OTHER): Payer: Medicare HMO | Admitting: *Deleted

## 2016-07-07 DIAGNOSIS — Z5181 Encounter for therapeutic drug level monitoring: Secondary | ICD-10-CM

## 2016-07-07 DIAGNOSIS — I059 Rheumatic mitral valve disease, unspecified: Secondary | ICD-10-CM | POA: Diagnosis not present

## 2016-07-07 DIAGNOSIS — Z954 Presence of other heart-valve replacement: Secondary | ICD-10-CM

## 2016-07-07 LAB — POCT INR: INR: 3.4

## 2016-07-14 ENCOUNTER — Encounter: Payer: Self-pay | Admitting: Internal Medicine

## 2016-07-14 ENCOUNTER — Ambulatory Visit (INDEPENDENT_AMBULATORY_CARE_PROVIDER_SITE_OTHER): Payer: Medicare HMO | Admitting: Internal Medicine

## 2016-07-14 VITALS — BP 112/80 | HR 80 | Ht 65.0 in | Wt 214.4 lb

## 2016-07-14 DIAGNOSIS — R059 Cough, unspecified: Secondary | ICD-10-CM

## 2016-07-14 DIAGNOSIS — R05 Cough: Secondary | ICD-10-CM

## 2016-07-14 DIAGNOSIS — J449 Chronic obstructive pulmonary disease, unspecified: Secondary | ICD-10-CM | POA: Diagnosis not present

## 2016-07-14 NOTE — Progress Notes (Signed)
Subjective:     Patient ID: Priscilla Stevens, female   DOB: 09/05/1950,   MRN: [id number]    Brief patient profile:  27 yowf quit smoking 2015 with pattern of severe coughing with colds dating back to childhood up to 4 x year seemed to grow out of around middle school then started back again in her late 61's with GOLD II copd/ab dx'd  11/01/15 tried on spiriva made the cough worse referred to pulmonary clinic 05/26/2016 by Dr   Melford Aase    History of Present Illness 05/26/2016 1st [facility] office visit/ Tramain Gershman   Chief Complaint  Patient presents with  . Pulmonary Consult    Referred by Dr. Melford Aase for COPD.  Pt states she was seen by PCP for facial pain, and when the PA listened to her lungs she was concerned. The pt denies any SOB but states "I have always had a cough"- bothersome only when she gets a cold.    main problem has been the cough "all her life" highly variable almost always coughs in church and at hs Neb may have helped at its worst but didn't flare off it > just back to the usual cough x decades  No am flares / better sleeping at 30 degrees  After zyrtec x one week did better "it finally worked" and maint on it ever since  Not limited by breathing from desired activities  But not very active  rec Symbicort 80 Take 2 puffs first thing in am and then another 2 puffs about 12 hours later  Work on inhaler technique:  relax and gently blow all the way out then take a nice smooth deep breath back in, triggering the inhaler at same time you start breathing in.  Hold for up to 5 seconds if you can. Blow out thru nose. Rinse and gargle with water when done Pantoprazole (protonix) 40 mg   Take  30-60 min before first meal of the day and Pepcid (famotidine)  20 mg one @  bedtime until return to office - this is the best way to tell whether stomach acid is contributing to your problem.   GERD diet  For drainage / throat tickle try take CHLORPHENIRAMINE  4 mg - take one every 4 hours as  needed - available over the counter- may cause drowsiness so start with just a bedtime dose or two and see how you tolerate it    07/14/2016  f/u ov/[place] [facility] re:  GOLD II COPD/ no longer coughing on max rx for gerd and off all inhalers Chief Complaint  Patient presents with  . Follow-up    Pt states that her breathing is at baseline. She in not coughing. She ran out of Symbicort 2 wks ago and did not refill due to price despite ins coverage.     Not limited by breathing from desired activities    No obvious day to day or daytime variability or assoc excess/ purulent sputum or mucus plugs or hemoptysis or cp or chest tightness, subjective wheeze or overt sinus or hb symptoms. No unusual exp hx or h/o childhood pna/ asthma or knowledge of premature birth.  Sleeping ok without nocturnal  or early am exacerbation  of respiratory  c/o's or need for noct saba. Also denies any obvious fluctuation of symptoms with weather or environmental changes or other aggravating or alleviating factors except as outlined above   Current Medications, Allergies, Complete Past Medical History, Past Surgical History, Family History, and Social History were  reviewed in [facility] record.  ROS  The following are not active complaints unless bolded sore throat, dysphagia, dental problems, itching, sneezing,  nasal congestion or excess/ purulent secretions, ear ache,   fever, chills, sweats, unintended wt loss, classically pleuritic or exertional cp,  orthopnea pnd or leg swelling, presyncope, palpitations, abdominal pain, anorexia, nausea, vomiting, diarrhea  or change in bowel or bladder habits, change in stools or urine, dysuria,hematuria,  rash, arthralgias, visual complaints, headache, numbness, weakness or ataxia or problems with walking or coordination,  change in mood/affect or memory.               Objective:   Physical Exam    amb obese wf nad    07/14/2016       214   05/26/16 214 lb  (97.1 kg)  02/11/16 210 lb 1.9 oz (95.3 kg)  07/08/15 212 lb (96.2 kg)    Vital signs reviewed  - Note on arrival 02 sats  98% on RA     HEENT: nl dentition, turbinates bilaterally, and oropharynx. Nl external ear canals without cough reflex   NECK :  without JVD/Nodes/TM/ nl carotid upstrokes bilaterally   LUNGS: no acc muscle use,  Nl contour chest with slt distant bs bilaterally but no wheeze at all off all inhalers    CV:  RRR  no s3 or murmur or increase in P2, and no edema   ABD:  soft and nontender with nl inspiratory excursion in the supine position. No bruits or organomegaly appreciated, bowel sounds nl  MS:  Nl gait/ ext warm without deformities, calf tenderness, cyanosis or clubbing No obvious joint restrictions   SKIN: warm and dry without lesions    NEURO:  alert, approp, nl sensorium with  no motor or cerebellar deficits apparent.      I personally reviewed images and agree with radiology impression as follows:  CXR:   02/11/16 Mild perihilar bronchitic changes. No infiltrate or pulmonary edema.       Assessment:          Outpatient Encounter Prescriptions as of 07/14/2016  Medication Sig  . albuterol (PROVENTIL HFA;VENTOLIN HFA) 108 (90 BASE) MCG/ACT inhaler Inhale 2 puffs into the lungs every 6 (six) hours as needed for wheezing or shortness of breath.  . ALPRAZolam (XANAX) 0.25 MG tablet Take 1 tablet (0.25 mg total) by mouth at bedtime as needed for sleep or anxiety.  . calcium carbonate (OS-CAL) 600 MG TABS Take 600 mg by mouth 2 (two) times daily with a meal.   . ezetimibe (ZETIA) 10 MG tablet Take 10 mg by mouth daily.  . famotidine (PEPCID) 20 MG tablet TAKE 1 TABLET BY MOUTH AT BEDTIME  . HYDROcodone-acetaminophen (NORCO/VICODIN) 5-325 MG per tablet Take 1 tablet by mouth every 6 (six) hours as needed for pain.  . hydroxychloroquine (PLAQUENIL) 200 MG tablet Take 200 mg by mouth 2 (two) times daily.  . metoprolol succinate (TOPROL-XL) 25 MG 24  hr tablet Take 1 tablet (25 mg total) by mouth 2 (two) times daily.  . pantoprazole (PROTONIX) 40 MG tablet Take 1 tablet (40 mg total) by mouth daily. Take 30-60 min before first meal of the day  . promethazine (PHENERGAN) 25 MG tablet Take 25 mg by mouth as needed for nausea.  Marland Kitchen venlafaxine XR (EFFEXOR-XR) 37.5 MG 24 hr capsule Take 1 capsule by mouth daily.  Marland Kitchen warfarin (COUMADIN) 5 MG tablet TAKE AS DIRECTED FROM COUMADIN CLINIC  . amoxicillin (AMOXIL) 500  MG tablet 4 tablets (2 G) 30-60 minutes prior to dental appointment. (Patient not taking: Reported on 05/26/2016)  . [DISCONTINUED] benzonatate (TESSALON) 100 MG capsule Take 1 capsule (100 mg total) by mouth 3 (three) times daily as needed for cough.  . [DISCONTINUED] budesonide-formoterol (SYMBICORT) 80-4.5 MCG/ACT inhaler Inhale 2 puffs into the lungs 2 (two) times daily. (Patient not taking: Reported on 07/14/2016)   No facility-administered encounter medications on file as of 07/14/2016.

## 2016-07-14 NOTE — Patient Instructions (Signed)
When you finish your pantoprazole resume your nexium as you did before and stop pepcid    For breathing >>>   Only use your albuterol as a rescue medication to be used if you can't catch your breath by resting or doing a relaxed purse lip breathing pattern.  - The less you use it, the better it will work when you need it. - Ok to use up to 2 puffs  every 4 hours if you must but call for immediate appointment if use goes up over your usual need - Don't leave home without it !!  (think of it like the spare tire for your car)    For drainage / throat tickle>>>   take CHLORPHENIRAMINE  4 mg - take one every 4 hours as needed - available over the counter- may cause drowsiness so start with just a bedtime dose or two and see how you tolerate it before trying in daytime    If you are satisfied with your treatment plan,  let your doctor know and he/she can either refill your medications or you can return here when your prescription runs out.     If in any way you are not 100% satisfied,  please tell us.  If 100% better, tell your friends!  Pulmonary follow up is as needed

## 2016-07-14 NOTE — Assessment & Plan Note (Signed)
Resolved 07/14/2016 off all inhalers and on max gerd rx    This is typical of Upper airway cough syndrome (previously labeled PNDS) , is  so named because it's frequently impossible to sort out how much is  CR/sinusitis with freq throat clearing (which can be related to primary GERD)   vs  causing  secondary (" extra esophageal")  GERD from wide swings in gastric pressure that occur with throat clearing, often  promoting self use of mint and menthol lozenges that reduce the lower esophageal sphincter tone and exacerbate the problem further in a cyclical fashion.   These are the same pts (now being labeled as having "irritable larynx syndrome" by some cough centers) who not infrequently have a history of having failed to tolerate ace inhibitors,  dry powder inhalers or biphosphonates or report having atypical/extraesophageal reflux symptoms that don't respond to standard doses of PPI  and are easily confused as having aecopd or asthma flares by even experienced allergists/ pulmonologists (myself included).  rec 3 months total aggressive acid suppression (started on 05/26/16)  then resume otc's > Follow up per Primary Care planned  - if flares on otcs then GI eval next

## 2016-07-14 NOTE — Assessment & Plan Note (Addendum)
-   quit smoking 2015  -  PFT's   11/01/15  FEV1 1,41 (53 % ) ratio 66  p 18 % improvement from saba p nothing prior to study with DLCO  61 % corrects to 85 % for alv volume   - 05/26/2016  After extensive coaching HFA effectiveness =    75%  > try symbicort 80 2bid  - no symptoms off all inhalers 07/14/2016 > pulmonary f/u prn    I had an extended final summary discussion with the patient reviewing all relevant studies completed to date and  lasting 15 to 20 minutes of a 25 minute visit on the following issues:          I reviewed the Fletcher curve with the patient that basically indicates  if you quit smoking when your best day FEV1 is still well preserved (as is clearly  the case here)  it is highly unlikely you will progress to severe disease and informed the patient there was  no medication on the market that has proven to alter the curve/ its downward trajectory  or the likelihood of progression of their disease(unlike other chronic medical conditions such as atheroclerosis where we do think we can change the natural hx with risk reducing meds)    Therefore    maintaining abstinence is the most important aspect of care, not choice of inhalers or for that matter, doctors.  rx for symptoms is prn and pulmonary f/u is also prn

## 2016-07-14 NOTE — Assessment & Plan Note (Signed)
Body mass index is 35.68 kg/m. - no change Lab Results  Component Value Date   TSH 1.812 06/20/2012     Contributing to gerd risk/ doe/reviewed the need and the process to achieve and maintain neg calorie balance > defer f/u primary care including intermittently monitoring thyroid status

## 2016-08-03 ENCOUNTER — Ambulatory Visit (INDEPENDENT_AMBULATORY_CARE_PROVIDER_SITE_OTHER): Payer: Medicare HMO | Admitting: *Deleted

## 2016-08-03 DIAGNOSIS — I059 Rheumatic mitral valve disease, unspecified: Secondary | ICD-10-CM

## 2016-08-03 DIAGNOSIS — Z5181 Encounter for therapeutic drug level monitoring: Secondary | ICD-10-CM | POA: Diagnosis not present

## 2016-08-03 DIAGNOSIS — Z954 Presence of other heart-valve replacement: Secondary | ICD-10-CM

## 2016-08-03 LAB — POCT INR: INR: 4

## 2016-08-13 ENCOUNTER — Ambulatory Visit (INDEPENDENT_AMBULATORY_CARE_PROVIDER_SITE_OTHER): Payer: Medicare HMO | Admitting: Internal Medicine

## 2016-08-13 ENCOUNTER — Encounter: Payer: Self-pay | Admitting: Internal Medicine

## 2016-08-13 VITALS — BP 114/80 | HR 77 | Ht 65.0 in | Wt 211.6 lb

## 2016-08-13 DIAGNOSIS — I099 Rheumatic heart disease, unspecified: Secondary | ICD-10-CM | POA: Diagnosis not present

## 2016-08-13 DIAGNOSIS — I059 Rheumatic mitral valve disease, unspecified: Secondary | ICD-10-CM

## 2016-08-13 DIAGNOSIS — Z954 Presence of other heart-valve replacement: Secondary | ICD-10-CM

## 2016-08-13 DIAGNOSIS — R0602 Shortness of breath: Secondary | ICD-10-CM

## 2016-08-13 NOTE — Progress Notes (Signed)
Follow-up Outpatient Visit Date: 08/13/2016  Primary Care Provider: Chesley Noon, MD [place] [id number]  Chief Complaint: Follow mitral valve replacement  HPI:  Priscilla Stevens is a 66 y.o. 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 She was previously seen in our office by Dr Aundra Dubin and Richardson Dopp. At her last visit in 02/2016, she was noted to have significant shortness of breath felt to be due to COPD. She was evaluated by pulmonology and was actually found to have a combination of upper airway cough syndrome, likely due to GERD, and COPD. She has been treated with PPIs and has noticed marked improvement in her symptoms.  From a heart standpoint, [name] has been doing well. She denies chest pain, shortness of breath, palpitations, edema, orthopnea, and PND. At times, she has some dizziness that she describes as double vision. She believes this is due to needing new glasses. She has not fallen. She denies bleeding and remains compliant with warfarin. She remains off aspirin due to a history of significant GI intolerance.   --------------------------------------------------------------------------------------------------  Pertinent Past Medical History: 1. Depression 2. Rheumatic heart disease: Patient developed moderate MS and moderate to severe MR.  4/14 he had MV replacement with #29 Sorin [employer] mitral valve.  Echo (5/14) with EF 55-60%, mild LVH, mechanical mitral valve with mean gradient 6 mmHg. Echo (9/16) with EF 65-70%, mechanical mitral valve, mean gradient 6 mmHg.  3. Morbid obesity 4. CLL 5. COPD 6. Fibromyalgia 7. LHC (3/14) with no significant CAD.  8. Medication-related QT prolongation 9. GERD with resultant upper airway cough syndrome  Past medical and  surgical history were reviewed and updated in EPIC.  Outpatient Encounter Prescriptions as of 08/13/2016  Medication Sig  . albuterol (PROVENTIL HFA;VENTOLIN HFA) 108 (90 BASE) MCG/ACT inhaler Inhale 2 puffs into the lungs every 6 (six) hours as needed for wheezing or shortness of breath.  . ALPRAZolam (XANAX) 0.25 MG tablet Take 1 tablet (0.25 mg total) by mouth at bedtime as needed for sleep or anxiety.  Marland Kitchen amoxicillin (AMOXIL) 500 MG tablet 4 tablets (2 G) 30-60 minutes prior to dental appointment.  . calcium carbonate (OS-CAL) 600 MG TABS Take 600 mg by mouth 2 (two) times daily with a meal.   . Esomeprazole Magnesium (NEXIUM PO) Take 20 mg by mouth daily.  Marland Kitchen ezetimibe (ZETIA) 10 MG tablet Take 10 mg by mouth daily.  Marland Kitchen HYDROcodone-acetaminophen (NORCO/VICODIN) 5-325 MG per tablet Take 1 tablet by mouth every 6 (six) hours as needed for pain.  . hydroxychloroquine (PLAQUENIL) 200 MG tablet Take 200 mg by mouth 2 (two) times daily.  . metoprolol succinate (TOPROL-XL) 25 MG 24 hr tablet Take 1 tablet (25 mg total) by mouth 2 (two) times daily.  . promethazine (PHENERGAN) 25 MG tablet Take 25 mg by mouth as needed for nausea.  Marland Kitchen venlafaxine XR (EFFEXOR-XR) 37.5 MG 24 hr capsule Take 1 capsule by mouth daily.  Marland Kitchen warfarin (COUMADIN) 5 MG tablet TAKE AS DIRECTED FROM COUMADIN CLINIC  . [DISCONTINUED] famotidine (PEPCID) 20 MG tablet TAKE 1 TABLET BY MOUTH AT BEDTIME  . [DISCONTINUED] pantoprazole (PROTONIX) 40 MG tablet Take 1 tablet (40 mg total) by mouth daily. Take 30-60 min before first meal of the day   No facility-administered encounter medications on file as of 08/13/2016.  Allergies: Statins and Aspirin  Social History   Social History  . Marital status: Married    Spouse name: N/A  . Number of children: N/A  . Years of education: N/A   Occupational History  . disabled    Social History Main Topics  . Smoking status: Former Smoker    Packs/day: 1.00    Years: 36.00    Quit  date: 04/06/2013  . Smokeless tobacco: Never Used  . Alcohol use 0.0 oz/week     Comment: 2x/year  . Drug use: No  . Sexual activity: Not Currently   Other Topics Concern  . Not on file   Social History Narrative   Sedentary lifestyle    Family History  Problem Relation Age of Onset  . Congestive Heart Failure Mother   . Cancer - Lung Father        smoked    Review of Systems: A 12-system review of systems was performed and was negative except as noted in the HPI.  --------------------------------------------------------------------------------------------------  Physical Exam: BP 114/80 (BP Location: Left Arm, Patient Position: Sitting)   Pulse 77   Ht 5\' 5"  (1.651 m)   Wt 211 lb 9.6 oz (96 kg)   SpO2 97%   BMI 35.21 kg/m  Repeat BP 114/80  General:  Obese woman, seated comfortably in the exam room. HEENT: No conjunctival pallor or scleral icterus.  Moist mucous membranes.  OP clear. Neck: Supple without lymphadenopathy, thyromegaly, JVD, or HJR.  No carotid bruit. Lungs: Normal work of breathing.  Clear to auscultation bilaterally without wheezes or crackles. Heart: Regular rate and rhythm with crisp S1 and S2. No murmurs, rubs, or gallops. Unable to assess PMI due to body habitus. Abd: Bowel sounds present.  Soft, NT/ND. Unable to assess HSM due to body habitus. Ext: No lower extremity edema.  Radial, PT, and DP pulses are 2+ bilaterally. Skin: warm and dry without rash  EKG:  Normal sinus rhythm with borderline QT prolongation (QTc 488 ms). No significant change from prior tracing.  Lab Results  Component Value Date   WBC 13.6 (H) 07/08/2015   HGB 13.9 07/08/2015   HCT 42.2 07/08/2015   MCV 84.1 07/08/2015   PLT 258 07/08/2015    Lab Results  Component Value Date   NA 140 07/08/2015   K 4.3 07/08/2015   CL 104 07/08/2015   CO2 27 07/08/2015   BUN 15 07/08/2015   CREATININE 1.01 (H) 07/08/2015   GLUCOSE 98 07/08/2015   ALT 15 07/04/2012    Lab  Results  Component Value Date   CHOL 159 07/08/2015   HDL 52 07/08/2015   LDLCALC 64 07/08/2015   TRIG 214 (H) 07/08/2015   CHOLHDL 3.1 07/08/2015    --------------------------------------------------------------------------------------------------  ASSESSMENT AND PLAN: Rheumatic heart disease status post mechanical mitral valve replacement The patient is doing well from a cardiac standpoint. She appears euvolemic and does not have any symptoms of heart failure. Echocardiogram in 2016 showed preserved EF with normal mitral valve prosthesis function. We will continue with her current medications, including indefinite anticoagulation with warfarin with a target INR of 2.5-3.5. We will continue to defer aspirin therapy, given history of GI intolerance.  Shortness of breath Symptoms much improved with treatment for GERD. We will defer ongoing management to Dr. Melvyn Novas.  Follow-up: Return to clinic in 6 months.  Nelva Bush, MD 08/15/2016 1:53 PM

## 2016-08-13 NOTE — Patient Instructions (Signed)
Medication Instructions:  Your physician recommends that you continue on your current medications as directed. Please refer to the Current Medication list given to you today.   Labwork: none  Testing/Procedures: none  Follow-Up: Your physician wants you to follow-up in: 6 months with Dr End. (November 2018).  You will receive a reminder letter in the mail two months in advance. If you don't receive a letter, please call our office to schedule the follow-up appointment.        If you need a refill on your cardiac medications before your next appointment, please call your pharmacy.

## 2016-08-15 ENCOUNTER — Encounter: Payer: Self-pay | Admitting: Internal Medicine

## 2016-08-20 ENCOUNTER — Ambulatory Visit (INDEPENDENT_AMBULATORY_CARE_PROVIDER_SITE_OTHER): Payer: Medicare HMO | Admitting: *Deleted

## 2016-08-20 DIAGNOSIS — I059 Rheumatic mitral valve disease, unspecified: Secondary | ICD-10-CM

## 2016-08-20 DIAGNOSIS — Z954 Presence of other heart-valve replacement: Secondary | ICD-10-CM | POA: Diagnosis not present

## 2016-08-20 DIAGNOSIS — Z5181 Encounter for therapeutic drug level monitoring: Secondary | ICD-10-CM | POA: Diagnosis not present

## 2016-08-20 LAB — POCT INR: INR: 3

## 2016-08-25 ENCOUNTER — Other Ambulatory Visit: Payer: Self-pay | Admitting: Internal Medicine

## 2016-09-09 ENCOUNTER — Ambulatory Visit (INDEPENDENT_AMBULATORY_CARE_PROVIDER_SITE_OTHER): Payer: Medicare HMO | Admitting: *Deleted

## 2016-09-09 DIAGNOSIS — Z954 Presence of other heart-valve replacement: Secondary | ICD-10-CM | POA: Diagnosis not present

## 2016-09-09 DIAGNOSIS — I059 Rheumatic mitral valve disease, unspecified: Secondary | ICD-10-CM

## 2016-09-09 DIAGNOSIS — Z5181 Encounter for therapeutic drug level monitoring: Secondary | ICD-10-CM | POA: Diagnosis not present

## 2016-09-09 LAB — POCT INR: INR: 3.4

## 2016-10-08 ENCOUNTER — Ambulatory Visit (INDEPENDENT_AMBULATORY_CARE_PROVIDER_SITE_OTHER): Payer: Medicare HMO | Admitting: *Deleted

## 2016-10-08 DIAGNOSIS — Z954 Presence of other heart-valve replacement: Secondary | ICD-10-CM | POA: Diagnosis not present

## 2016-10-08 DIAGNOSIS — I059 Rheumatic mitral valve disease, unspecified: Secondary | ICD-10-CM

## 2016-10-08 DIAGNOSIS — Z5181 Encounter for therapeutic drug level monitoring: Secondary | ICD-10-CM | POA: Diagnosis not present

## 2016-10-08 LAB — POCT INR: INR: 3.3

## 2016-11-11 ENCOUNTER — Ambulatory Visit (INDEPENDENT_AMBULATORY_CARE_PROVIDER_SITE_OTHER): Payer: Medicare HMO | Admitting: *Deleted

## 2016-11-11 DIAGNOSIS — Z5181 Encounter for therapeutic drug level monitoring: Secondary | ICD-10-CM | POA: Diagnosis not present

## 2016-11-11 DIAGNOSIS — I059 Rheumatic mitral valve disease, unspecified: Secondary | ICD-10-CM | POA: Diagnosis not present

## 2016-11-11 DIAGNOSIS — Z954 Presence of other heart-valve replacement: Secondary | ICD-10-CM | POA: Diagnosis not present

## 2016-11-11 LAB — POCT INR: INR: 3.4

## 2016-12-17 ENCOUNTER — Other Ambulatory Visit: Payer: Self-pay | Admitting: Cardiology

## 2016-12-23 ENCOUNTER — Ambulatory Visit (INDEPENDENT_AMBULATORY_CARE_PROVIDER_SITE_OTHER): Payer: Medicare HMO | Admitting: *Deleted

## 2016-12-23 DIAGNOSIS — Z5181 Encounter for therapeutic drug level monitoring: Secondary | ICD-10-CM | POA: Diagnosis not present

## 2016-12-23 DIAGNOSIS — I059 Rheumatic mitral valve disease, unspecified: Secondary | ICD-10-CM

## 2016-12-23 DIAGNOSIS — Z954 Presence of other heart-valve replacement: Secondary | ICD-10-CM

## 2016-12-23 LAB — PROTIME-INR
INR: 4.6 — AB (ref 0.8–1.2)
PROTHROMBIN TIME: 49.3 s — AB (ref 9.1–12.0)

## 2016-12-23 LAB — POCT INR: INR: 6

## 2017-01-13 ENCOUNTER — Ambulatory Visit (INDEPENDENT_AMBULATORY_CARE_PROVIDER_SITE_OTHER): Payer: Medicare HMO | Admitting: Pharmacist

## 2017-01-13 DIAGNOSIS — Z954 Presence of other heart-valve replacement: Secondary | ICD-10-CM | POA: Diagnosis not present

## 2017-01-13 DIAGNOSIS — Z5181 Encounter for therapeutic drug level monitoring: Secondary | ICD-10-CM

## 2017-01-13 DIAGNOSIS — I059 Rheumatic mitral valve disease, unspecified: Secondary | ICD-10-CM

## 2017-01-13 LAB — PROTIME-INR
INR: 5.3 — ABNORMAL HIGH (ref 0.8–1.2)
Prothrombin Time: 56.2 s — ABNORMAL HIGH (ref 9.1–12.0)

## 2017-01-13 LAB — POCT INR: INR: 7.6

## 2017-01-20 ENCOUNTER — Ambulatory Visit (INDEPENDENT_AMBULATORY_CARE_PROVIDER_SITE_OTHER): Payer: Medicare HMO | Admitting: *Deleted

## 2017-01-20 DIAGNOSIS — Z5181 Encounter for therapeutic drug level monitoring: Secondary | ICD-10-CM | POA: Diagnosis not present

## 2017-01-20 DIAGNOSIS — Z954 Presence of other heart-valve replacement: Secondary | ICD-10-CM | POA: Diagnosis not present

## 2017-01-20 DIAGNOSIS — Z23 Encounter for immunization: Secondary | ICD-10-CM

## 2017-01-20 DIAGNOSIS — Z952 Presence of prosthetic heart valve: Secondary | ICD-10-CM

## 2017-01-20 DIAGNOSIS — I059 Rheumatic mitral valve disease, unspecified: Secondary | ICD-10-CM

## 2017-01-20 LAB — POCT INR: INR: 2.7

## 2017-02-04 ENCOUNTER — Ambulatory Visit (INDEPENDENT_AMBULATORY_CARE_PROVIDER_SITE_OTHER): Payer: Medicare HMO | Admitting: *Deleted

## 2017-02-04 DIAGNOSIS — Z5181 Encounter for therapeutic drug level monitoring: Secondary | ICD-10-CM | POA: Diagnosis not present

## 2017-02-04 DIAGNOSIS — I059 Rheumatic mitral valve disease, unspecified: Secondary | ICD-10-CM | POA: Diagnosis not present

## 2017-02-04 DIAGNOSIS — Z954 Presence of other heart-valve replacement: Secondary | ICD-10-CM | POA: Diagnosis not present

## 2017-02-04 LAB — POCT INR: INR: 3

## 2017-02-12 ENCOUNTER — Ambulatory Visit: Payer: Medicare HMO | Admitting: Internal Medicine

## 2017-02-12 ENCOUNTER — Encounter: Payer: Self-pay | Admitting: Internal Medicine

## 2017-02-12 VITALS — BP 108/64 | HR 75 | Ht 65.0 in | Wt 216.0 lb

## 2017-02-12 DIAGNOSIS — R0602 Shortness of breath: Secondary | ICD-10-CM | POA: Diagnosis not present

## 2017-02-12 DIAGNOSIS — I099 Rheumatic heart disease, unspecified: Secondary | ICD-10-CM

## 2017-02-12 DIAGNOSIS — Z954 Presence of other heart-valve replacement: Secondary | ICD-10-CM

## 2017-02-12 DIAGNOSIS — R9431 Abnormal electrocardiogram [ECG] [EKG]: Secondary | ICD-10-CM

## 2017-02-12 MED ORDER — FUROSEMIDE 20 MG PO TABS: 20.0000 mg | ORAL_TABLET | Freq: Every day | ORAL | 1 refills | Status: DC

## 2017-02-12 NOTE — Addendum Note (Signed)
Addended by: Claude Manges on: 02/12/2017 01:05 PM   Modules accepted: Orders

## 2017-02-12 NOTE — Progress Notes (Signed)
 Follow-up Outpatient Visit Date: 02/12/2017  Primary Care Provider: Badger, Michael C, MD [address] [place] [address]  Chief Complaint: Shortness of breath  HPI:  Priscilla Stevens is a 65 y.o. 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 I last saw her in May, at which time she was doing well from a heart standpoint. She noted some dizziness, but felt that this was related to recent change in her prescription glasses.  Today, Priscilla Stevens notes that she has had intermittent dyspnea on exertion with moderate activity such as walking to her mailbox. It has become more pronounced over the last 6-12 months. She also has noted occasional dependent edema. She is put on 2-5 pounds since the spring. This [place] notes chronic 2 pillow orthopnea but no PND. She intermittently eats salty foods, that she tries to avoid the possible. She has not had any chest pain. When bending forward, she sometimes feels as though her heart races. This resolves as soon as she stands up again. She has noted some lightheadedness in the mornings when she stands for extended periods. She has not passed out. She remains compliant with her medications, quitting warfarin. INR was noted to be quite supratherapeutic earlier this fall around the time that she was having eye surgery. It is now back to target. She denies any bleeding. She is not on aspirin at this time due to significant GI intolerance with even low dose aspirin.  --------------------------------------------------------------------------------------------------  Pertinent Past Medical History: 1. Depression 2. Rheumatic heart disease: Patient developed moderate MS and moderate to severe MR. 4/14 he had MV replacement with #29 Sorin Carbomedics mitral valve. Echo (5/14) with  EF 55-60%, mild LVH, mechanical mitral valve with mean gradient 6 mmHg. Echo (9/16) with EF 65-70%, mechanical mitral valve, mean gradient 6 mmHg.  3. Morbid obesity 4. CLL 5. COPD 6. Fibromyalgia 7. LHC (3/14) with no significant CAD.  8. Medication-related QT prolongation 9. GERD with resultant upper airway cough syndrome  Current Meds  Medication Sig  . albuterol (PROVENTIL HFA;VENTOLIN HFA) 108 (90 BASE) MCG/ACT inhaler Inhale 2 puffs into the lungs every 6 (six) hours as needed for wheezing or shortness of breath.  . ALPRAZolam (XANAX) 0.25 MG tablet Take 1 tablet (0.25 mg total) by mouth at bedtime as needed for sleep or anxiety.  . amoxicillin (AMOXIL) 500 MG tablet 4 tablets (2 G) 30-60 minutes prior to dental appointment.  . calcium carbonate (OS-CAL) 600 MG TABS Take 600 mg by mouth 2 (two) times daily with a meal.   . Esomeprazole Magnesium (NEXIUM PO) Take 20 mg by mouth daily.  . ezetimibe (ZETIA) 10 MG tablet Take 10 mg by mouth daily.  . HYDROcodone-acetaminophen (NORCO/VICODIN) 5-325 MG per tablet Take 1 tablet by mouth every 6 (six) hours as needed for pain.  . hydroxychloroquine (PLAQUENIL) 200 MG tablet Take 200 mg by mouth 2 (two) times daily.  . metoprolol succinate (TOPROL-XL) 25 MG 24 hr tablet Take 1 tablet (25 mg total) by mouth 2 (two) times daily.  . promethazine (PHENERGAN) 25 MG tablet Take 25 mg by mouth as needed for nausea.  . venlafaxine XR (EFFEXOR-XR) 37.5 MG 24 hr capsule Take 1 capsule by mouth daily.  . warfarin (COUMADIN) 5 MG tablet TAKE AS DIRECTED FROM COUMADIN CLINIC  . [DISCONTINUED] pantoprazole (PROTONIX) 40 MG tablet TAKE 1   TABLET BY MOUTH DAILY 30-60 MINUTES BEFORE 1ST MEAL OF THE DAY    Allergies: Statins and Aspirin  Social History   Socioeconomic History  . Marital status: Married    Spouse name: Not on file  . Number of children: Not on file  . Years of education: Not on file  . Highest education level: Not on file  Social Needs    . Financial resource strain: Not on file  . Food insecurity - worry: Not on file  . Food insecurity - inability: Not on file  . Transportation needs - medical: Not on file  . Transportation needs - non-medical: Not on file  Occupational History  . Occupation: disabled  Tobacco Use  . Smoking status: Former Smoker    Packs/day: 1.00    Years: 36.00    Pack years: 36.00    Last attempt to quit: 04/06/2013    Years since quitting: 3.8  . Smokeless tobacco: Never Used  Substance and Sexual Activity  . Alcohol use: Yes    Alcohol/week: 0.0 oz    Comment: 2x/year  . Drug use: No  . Sexual activity: Not Currently  Other Topics Concern  . Not on file  Social History Narrative   Sedentary lifestyle    Family History  Problem Relation Age of Onset  . Congestive Heart Failure Mother   . Cancer - Lung Father        smoked    Review of Systems: A 12-system review of systems was performed and was negative except as noted in the HPI.  --------------------------------------------------------------------------------------------------  Physical Exam: BP 108/64   Pulse 75   Ht 5' 5" (1.651 m)   Wt 216 lb (98 kg)   BMI 35.94 kg/m   General:  Obese woman, seated, when the exam room. HEENT: No conjunctival pallor or scleral icterus. Moist mucous membranes.  OP clear. Neck: Supple without lymphadenopathy, thyromegaly, JVD, or HJR. No carotid bruit. Lungs: Normal work of breathing. Clear to auscultation bilaterally without wheezes or crackles. Heart: Regular rate and rhythm without murmurs, rubs, or gallops. Mechanical S1 and S2 noted. Unable to assess PMI due to body habitus. Abd: Bowel sounds present. Soft, NT/ND. Unable to assess HSM due to body habitus. Ext: No lower extremity edema. Radial, PT, and DP pulses are 2+ bilaterally. Skin: Warm and dry without rash.  I had the patient bend forward, which reproduced palpitations. I monitored her pulse during this maneuver and noted no  change in heart rate from baseline.  EKG:  Normal sinus rhythm with mild QT prolongation (QTc 498 ms). No significant change from prior tracings.  Lab Results  Component Value Date   WBC 13.6 (H) 07/08/2015   HGB 13.9 07/08/2015   HCT 42.2 07/08/2015   MCV 84.1 07/08/2015   PLT 258 07/08/2015    Lab Results  Component Value Date   NA 140 07/08/2015   K 4.3 07/08/2015   CL 104 07/08/2015   CO2 27 07/08/2015   BUN 15 07/08/2015   CREATININE 1.01 (H) 07/08/2015   GLUCOSE 98 07/08/2015   ALT 15 07/04/2012    Lab Results  Component Value Date   CHOL 159 07/08/2015   HDL 52 07/08/2015   LDLCALC 64 07/08/2015   TRIG 214 (H) 07/08/2015   CHOLHDL 3.1 07/08/2015   Outside labs (11/16/16): CMP: Sodium 141, potassium 4.7, chloride 104, CO2 22, BUN 13, creatinine 0.91, AST 20, ALT 10, alk phosphatase 71, total bilirubin 0.2, total protein 6.6, albumin 4.3    Lipid panel: Total cholesterol 191, triglycerides 140, HDL 48, LDL 115  --------------------------------------------------------------------------------------------------  ASSESSMENT AND PLAN: Rheumatic heart disease status post mechanical mitral valve replacement Ms. Witcher notes increasing dyspnea on exertion and intermittent dependent edema over the last 6-12 months. She appears euvolemic on exam today. Her weight is up 2-5 pounds since April/May. We have agreed to start furosemide 20 mg daily with repeat BMP in about 2 weeks. We will also obtain an echocardiogram at that time to assess her LV function and mechanical mitral valve. I will not change her metoprolol, as heart rate control will help lower her transmitral gradient. However, if she has worsening lightheadedness, we may need to consider de-escalation. I stressed the importance of continuing antibiotic prophylaxis before any dental procedures.  Shortness of breath This is likely multifactorial. We will try low-dose furosemide and repeat an echo as above. His  Lawther should continue to follow up with pulmonary.  QT prolongation Mild QT prolongation persist. Patient advised to avoid medications that could further prolong this, including promethazine.  Follow-up: Return to clinic in 3 months.  Priscilla Bush, MD 02/12/2017 8:12 AM

## 2017-02-12 NOTE — Patient Instructions (Signed)
Medication Instructions:  Start lasix (furosemide) 20 mg daily  Labwork: Your physician recommends that you return for lab work on 03/02/17---BMET   Testing/Procedures: Your physician has requested that you have an echocardiogram. Echocardiography is a painless test that uses sound waves to create images of your heart. It provides your doctor with information about the size and shape of your heart and how well your heart's chambers and valves are working. This procedure takes approximately one hour. There are no restrictions for this procedure.   Follow-Up: Your physician recommends that you schedule a follow-up appointment in: 3 months with Dr End.       If you need a refill on your cardiac medications before your next appointment, please call your pharmacy.

## 2017-03-02 ENCOUNTER — Other Ambulatory Visit: Payer: Self-pay

## 2017-03-02 ENCOUNTER — Ambulatory Visit (HOSPITAL_COMMUNITY): Payer: Medicare HMO | Attending: Cardiology

## 2017-03-02 ENCOUNTER — Other Ambulatory Visit: Payer: Medicare HMO

## 2017-03-02 ENCOUNTER — Ambulatory Visit (INDEPENDENT_AMBULATORY_CARE_PROVIDER_SITE_OTHER): Payer: Medicare HMO

## 2017-03-02 DIAGNOSIS — Z5181 Encounter for therapeutic drug level monitoring: Secondary | ICD-10-CM

## 2017-03-02 DIAGNOSIS — Z954 Presence of other heart-valve replacement: Secondary | ICD-10-CM

## 2017-03-02 DIAGNOSIS — R0602 Shortness of breath: Secondary | ICD-10-CM

## 2017-03-02 DIAGNOSIS — Z856 Personal history of leukemia: Secondary | ICD-10-CM | POA: Diagnosis not present

## 2017-03-02 DIAGNOSIS — Z87891 Personal history of nicotine dependence: Secondary | ICD-10-CM | POA: Diagnosis not present

## 2017-03-02 DIAGNOSIS — Z952 Presence of prosthetic heart valve: Secondary | ICD-10-CM | POA: Diagnosis not present

## 2017-03-02 DIAGNOSIS — I099 Rheumatic heart disease, unspecified: Secondary | ICD-10-CM | POA: Diagnosis not present

## 2017-03-02 DIAGNOSIS — J449 Chronic obstructive pulmonary disease, unspecified: Secondary | ICD-10-CM | POA: Diagnosis not present

## 2017-03-02 DIAGNOSIS — I509 Heart failure, unspecified: Secondary | ICD-10-CM | POA: Insufficient documentation

## 2017-03-02 DIAGNOSIS — R9431 Abnormal electrocardiogram [ECG] [EKG]: Secondary | ICD-10-CM

## 2017-03-02 DIAGNOSIS — I059 Rheumatic mitral valve disease, unspecified: Secondary | ICD-10-CM | POA: Diagnosis not present

## 2017-03-02 DIAGNOSIS — Z6835 Body mass index (BMI) 35.0-35.9, adult: Secondary | ICD-10-CM | POA: Insufficient documentation

## 2017-03-02 LAB — ECHOCARDIOGRAM COMPLETE
AOASC: 34 cm
CHL CUP DOP CALC LVOT VTI: 20.9 cm
CHL CUP LVOT MV VTI INDEX: 0.9 cm2/m2
CHL CUP MV DEC (S): 314
EWDT: 314 ms
FS: 33 % (ref 28–44)
IV/PV OW: 0.7
LA ID, A-P, ES: 46 mm
LA diam end sys: 46 mm
LA diam index: 2.25 cm/m2
LA vol index: 28.9 mL/m2
LAVOL: 59 mL
LAVOLA4C: 66.8 mL
LV PW d: 10.1 mm — AB (ref 0.6–1.1)
LVOT MV VTI: 1.83
LVOT SV: 72 mL
LVOT area: 3.46 cm2
LVOT peak vel: 96.8 cm/s
LVOTD: 21 mm
Lateral S' vel: 8.05 cm/s
MV Annulus VTI: 39.6 cm
MV M vel: 98.1
MV Peak grad: 12 mmHg
MV pk A vel: 84.4 m/s
MV pk E vel: 127 m/s
MVAP: 2.53 cm2
MVSPHT: 87 ms
Mean grad: 5 mmHg
TAPSE: 21.2 mm

## 2017-03-02 LAB — BASIC METABOLIC PANEL
BUN / CREAT RATIO: 10 — AB (ref 12–28)
BUN: 8 mg/dL (ref 8–27)
CO2: 27 mmol/L (ref 20–29)
CREATININE: 0.78 mg/dL (ref 0.57–1.00)
Calcium: 9.7 mg/dL (ref 8.7–10.3)
Chloride: 102 mmol/L (ref 96–106)
GFR calc Af Amer: 93 mL/min/{1.73_m2} (ref >59)
GFR, EST NON AFRICAN AMERICAN: 81 mL/min/{1.73_m2} (ref >59)
GLUCOSE: 99 mg/dL (ref 65–99)
Potassium: 4.7 mmol/L (ref 3.5–5.2)
SODIUM: 144 mmol/L (ref 134–144)

## 2017-03-02 LAB — POCT INR: INR: 2.7

## 2017-03-02 NOTE — Patient Instructions (Signed)
Continue taking 1.5 tablets daily except 1 tablet on Mondays, Wednesdays, and Fridays. Recheck INR in 4 weeks. Call with any questions new medications or procedures  [phone]

## 2017-03-23 ENCOUNTER — Other Ambulatory Visit: Payer: Self-pay | Admitting: Cardiology

## 2017-04-07 ENCOUNTER — Ambulatory Visit (INDEPENDENT_AMBULATORY_CARE_PROVIDER_SITE_OTHER): Payer: Medicare HMO | Admitting: *Deleted

## 2017-04-07 DIAGNOSIS — I059 Rheumatic mitral valve disease, unspecified: Secondary | ICD-10-CM

## 2017-04-07 DIAGNOSIS — Z954 Presence of other heart-valve replacement: Secondary | ICD-10-CM

## 2017-04-07 DIAGNOSIS — Z952 Presence of prosthetic heart valve: Secondary | ICD-10-CM | POA: Diagnosis not present

## 2017-04-07 DIAGNOSIS — Z5181 Encounter for therapeutic drug level monitoring: Secondary | ICD-10-CM

## 2017-04-07 LAB — POCT INR: INR: 2.9

## 2017-04-07 NOTE — Patient Instructions (Signed)
Description   Continue taking 1.5 tablets daily except 1 tablet on Mondays, Wednesdays, and Fridays. Recheck INR in 5 weeks. Call with any questions new medications or procedures  [phone]

## 2017-05-13 ENCOUNTER — Ambulatory Visit: Payer: Medicare HMO | Admitting: Internal Medicine

## 2017-05-13 ENCOUNTER — Ambulatory Visit (INDEPENDENT_AMBULATORY_CARE_PROVIDER_SITE_OTHER): Payer: Medicare HMO | Admitting: *Deleted

## 2017-05-13 ENCOUNTER — Encounter: Payer: Self-pay | Admitting: Internal Medicine

## 2017-05-13 VITALS — BP 124/70 | HR 78 | Ht 65.0 in | Wt 211.0 lb

## 2017-05-13 DIAGNOSIS — Z5181 Encounter for therapeutic drug level monitoring: Secondary | ICD-10-CM | POA: Diagnosis not present

## 2017-05-13 DIAGNOSIS — Z954 Presence of other heart-valve replacement: Secondary | ICD-10-CM | POA: Diagnosis not present

## 2017-05-13 DIAGNOSIS — E785 Hyperlipidemia, unspecified: Secondary | ICD-10-CM | POA: Insufficient documentation

## 2017-05-13 DIAGNOSIS — I059 Rheumatic mitral valve disease, unspecified: Secondary | ICD-10-CM

## 2017-05-13 DIAGNOSIS — I099 Rheumatic heart disease, unspecified: Secondary | ICD-10-CM

## 2017-05-13 LAB — POCT INR: INR: 3.5

## 2017-05-13 NOTE — Progress Notes (Signed)
Follow-up Outpatient Visit Date: 05/13/2017  Primary Care Provider: Chesley Noon, MD [place] [address]  Chief Complaint: Follow-up valvular heart disease  HPI:  Priscilla Stevens is a 67 y.o. year-old female with history of  rheumatic heart disease status post mitral valve replacement with mechanical MVR in 10/3708,GYIRSWN diastolic heart failure, CLL,andCOPD, who presents for follow-up of mitral valve disease.  I last saw her in 02/2017, at which time she reported intermittent dyspnea on exertion with moderate activity, as well as occasional dependent edema.  We agreed to start furosemide 20 mg daily repeat an echocardiogram.  This showed preserved LVEF with normal function of mitral valve prosthesis and stable mean gradient compared with 12/2014.  Today, Priscilla Stevens reports that she is feeling the best that she has felt in quite some time.  She noticed significant improvement in her breathing and swelling with addition of low-dose furosemide in November.  Today, she denies shortness of breath, chest pains, palpitations, and edema.  She has not had any significant bleeding and remains on warfarin.  Of note, she has been intolerant of aspirin in the past with significant GI upset.  She takes antibiotics prophylactically before any dental procedure.  She is exercising some and trying to lose weight; her weight is down 5 pounds since November.  --------------------------------------------------------------------------------------------------  Pertinent Past Medical/Surgical History: 1. Depression 2. Rheumatic heart disease: Patient developed moderate MS and moderate to severe MR. 4/14 he had MV replacement with #29 Sorin Carbomedics mitral valve. Echo (5/14) with EF 55-60%, mild LVH, mechanical mitral valve with mean gradient 6 mmHg. Echo (9/16) with EF 65-70%, mechanical mitral valve, mean gradient 6 mmHg.  Echo (11/18) with LV EF 30-65%, mechanical mitral valve (mean  gradient 5 mmHg). 3. Morbid obesity 4. CLL 5. COPD 6. Fibromyalgia 7. LHC (3/14) with no significant CAD.  8. Medication-related QT prolongation 9. GERD with resultant upper airway cough syndrome  Recent CV Pertinent Labs: Lab Results  Component Value Date   CHOL 159 07/08/2015   HDL 52 07/08/2015   LDLCALC 64 07/08/2015   TRIG 214 (H) 07/08/2015   CHOLHDL 3.1 07/08/2015   INR 3.5 05/13/2017   INR 5.3 (H) 01/13/2017   K 4.7 03/02/2017   MG 2.2 03/22/2013   BUN 8 03/02/2017   CREATININE 0.78 03/02/2017   CREATININE 1.01 (H) 07/08/2015    Past medical and surgical history were reviewed and updated in EPIC.  Current Meds  Medication Sig  . ALPRAZolam (XANAX) 0.25 MG tablet Take 1 tablet (0.25 mg total) by mouth at bedtime as needed for sleep or anxiety.  Marland Kitchen amoxicillin (AMOXIL) 500 MG tablet 4 tablets (2 G) 30-60 minutes prior to dental appointment.  . calcium carbonate (OS-CAL) 600 MG TABS Take 600 mg by mouth 2 (two) times daily with a meal.   . Esomeprazole Magnesium (NEXIUM PO) Take 20 mg by mouth daily.  Marland Kitchen ezetimibe (ZETIA) 10 MG tablet Take 10 mg by mouth daily.  . furosemide (LASIX) 20 MG tablet Take 1 tablet (20 mg total) daily by mouth.  Marland Kitchen HYDROcodone-acetaminophen (NORCO/VICODIN) 5-325 MG per tablet Take 1 tablet by mouth every 6 (six) hours as needed for pain.  . hydroxychloroquine (PLAQUENIL) 200 MG tablet Take 200 mg by mouth 2 (two) times daily.  . metoprolol succinate (TOPROL-XL) 25 MG 24 hr tablet TAKE 1 TABLET TWICE DAILY  . venlafaxine XR (EFFEXOR-XR) 37.5 MG 24 hr capsule Take 1 capsule by mouth daily.  Marland Kitchen warfarin (COUMADIN) 5  MG tablet TAKE AS DIRECTED FROM COUMADIN CLINIC    Allergies: Statins and Aspirin  Social History   Socioeconomic History  . Marital status: Married    Spouse name: Not on file  . Number of children: Not on file  . Years of education: Not on file  . Highest education level: Not on file  Social Needs  . Financial resource  strain: Not on file  . Food insecurity - worry: Not on file  . Food insecurity - inability: Not on file  . Transportation needs - medical: Not on file  . Transportation needs - non-medical: Not on file  Occupational History  . Occupation: disabled  Tobacco Use  . Smoking status: Former Smoker    Packs/day: 1.00    Years: 36.00    Pack years: 36.00    Last attempt to quit: 04/06/2013    Years since quitting: 4.1  . Smokeless tobacco: Never Used  Substance and Sexual Activity  . Alcohol use: Yes    Alcohol/week: 0.0 oz    Comment: 2x/year  . Drug use: No  . Sexual activity: Not Currently  Other Topics Concern  . Not on file  Social History Narrative   Sedentary lifestyle    Family History  Problem Relation Age of Onset  . Congestive Heart Failure Mother   . Cancer - Lung Father        smoked    Review of Systems: A 12-system review of systems was performed and was negative except as noted in the HPI.  --------------------------------------------------------------------------------------------------  Physical Exam: BP 124/70   Pulse 78   Ht 5\' 5"  (1.651 m)   Wt 211 lb (95.7 kg)   SpO2 99%   BMI 35.11 kg/m    General: Obese woman, seated comfortably in the exam room. HEENT: No conjunctival pallor or scleral icterus. Moist mucous membranes.  OP clear. Neck: Supple without lymphadenopathy, thyromegaly, JVD, or HJR.. Lungs: Normal work of breathing. Clear to auscultation bilaterally without wheezes or crackles. Heart: Regular rate and rhythm without murmurs, rubs, or gallops.  Mechanical S1 noted. Abd: Bowel sounds present. Soft, NT/ND.  Unable to assess PMI due to body habitus. Ext: No lower extremity edema. Radial, PT, and DP pulses are 2+ bilaterally. Skin: Warm and dry without rash.  Lab Results  Component Value Date   WBC 13.6 (H) 07/08/2015   HGB 13.9 07/08/2015   HCT 42.2 07/08/2015   MCV 84.1 07/08/2015   PLT 258 07/08/2015    Lab Results  Component  Value Date   NA 144 03/02/2017   K 4.7 03/02/2017   CL 102 03/02/2017   CO2 27 03/02/2017   BUN 8 03/02/2017   CREATININE 0.78 03/02/2017   GLUCOSE 99 03/02/2017   ALT 15 07/04/2012    Lab Results  Component Value Date   CHOL 159 07/08/2015   HDL 52 07/08/2015   LDLCALC 64 07/08/2015   TRIG 214 (H) 07/08/2015   CHOLHDL 3.1 07/08/2015    --------------------------------------------------------------------------------------------------  ASSESSMENT AND PLAN: Rheumatic heart disease status post mechanical mitral valve replacement Priscilla Stevens is doing well with marked improvement in dyspnea and edema after addition of low-dose furosemide in November.  Follow-up BMP showed normal renal function and potassium.  Echocardiogram also showed preserved LVEF with normal mechanical mitral valve function.  We will continue Priscilla Stevens's current medications.  We will not start aspirin given history of intolerance.  I reiterated the importance of antibiotic prophylaxis before any dental procedures.  We will plan  to repeat a basic metabolic panel shortly before her follow-up in 6 months to ensure stable potassium and kidney function.  Hyperlipidemia Tolerating ezetimibe well.  No significant CAD noted on LHC in 2014.  I will defer ongoing management to Dr. Melford Aase.  Follow-up: Return to see Richardson Dopp, PA, in 6 months; see me in 1 year.  Nelva Bush, MD 05/13/2017 9:22 AM

## 2017-05-13 NOTE — Patient Instructions (Signed)
Description   Continue taking 1.5 tablets daily except 1 tablet on Mondays, Wednesdays, and Fridays. Recheck INR in 6 weeks. Call with any questions new medications or procedures  [phone]      

## 2017-05-13 NOTE — Patient Instructions (Addendum)
Medication Instructions:  Your physician recommends that you continue on your current medications as directed. Please refer to the Current Medication list given to you today.  -- If you need a refill on your cardiac medications before your next appointment, please call your pharmacy. --  Labwork: June 17, 2017 BMP   Testing/Procedures: None ordered  Follow-Up: Your physician wants you to follow-up in: 6 months with Richardson Dopp.    You will receive a reminder letter in the mail two months in advance. If you don't receive a letter, please call our office to schedule the follow-up appointment.  Thank you for choosing [facility]!!    Any Other Special Instructions Will Be Listed Below (If Applicable).

## 2017-06-09 ENCOUNTER — Telehealth: Payer: Self-pay | Admitting: Internal Medicine

## 2017-06-09 NOTE — Telephone Encounter (Signed)
New Message   *STAT* If patient is at the pharmacy, call can be transferred to refill team.   1. Which medications need to be refilled? (please list name of each medication and dose if known) amoxicillin (AMOXIL) 500 MG tablet  2. Which pharmacy/location (including street and city if local pharmacy) is medication to be sent to? [id number] - [place], [place] - [address]  3. Do they need a 30 day or 90 day supply? 4 tablets before dental appt  Pt verbalized that every time she goes to her dentist she has to take antibiotic due to her bile replacement

## 2017-06-10 ENCOUNTER — Other Ambulatory Visit: Payer: Self-pay

## 2017-06-10 MED ORDER — AMOXICILLIN 500 MG PO TABS: ORAL_TABLET | ORAL | 3 refills | Status: DC

## 2017-06-10 NOTE — Telephone Encounter (Signed)
Spoke with patient and informed her that we refilled Amoxicillin 500 mg pre dental treatment..  Patient verbalized understanding

## 2017-06-17 ENCOUNTER — Other Ambulatory Visit: Payer: Medicare HMO

## 2017-06-17 DIAGNOSIS — I099 Rheumatic heart disease, unspecified: Secondary | ICD-10-CM

## 2017-06-17 LAB — BASIC METABOLIC PANEL
BUN / CREAT RATIO: 13 (ref 12–28)
BUN: 12 mg/dL (ref 8–27)
CO2: 26 mmol/L (ref 20–29)
CREATININE: 0.91 mg/dL (ref 0.57–1.00)
Calcium: 9.4 mg/dL (ref 8.7–10.3)
Chloride: 103 mmol/L (ref 96–106)
GFR calc non Af Amer: 67 mL/min/{1.73_m2} (ref >59)
GFR, EST AFRICAN AMERICAN: 77 mL/min/{1.73_m2} (ref >59)
GLUCOSE: 99 mg/dL (ref 65–99)
Potassium: 4.8 mmol/L (ref 3.5–5.2)
SODIUM: 143 mmol/L (ref 134–144)

## 2017-06-24 ENCOUNTER — Ambulatory Visit (INDEPENDENT_AMBULATORY_CARE_PROVIDER_SITE_OTHER): Payer: Medicare HMO | Admitting: Pharmacist

## 2017-06-24 DIAGNOSIS — Z954 Presence of other heart-valve replacement: Secondary | ICD-10-CM | POA: Diagnosis not present

## 2017-06-24 DIAGNOSIS — I059 Rheumatic mitral valve disease, unspecified: Secondary | ICD-10-CM | POA: Diagnosis not present

## 2017-06-24 DIAGNOSIS — Z5181 Encounter for therapeutic drug level monitoring: Secondary | ICD-10-CM

## 2017-06-24 LAB — POCT INR: INR: 3.1

## 2017-06-24 NOTE — Patient Instructions (Signed)
Description   Continue taking 1.5 tablets daily except 1 tablet on Mondays, Wednesdays, and Fridays. Recheck INR in 6 weeks. Call with any questions new medications or procedures  [phone]      

## 2017-08-05 ENCOUNTER — Ambulatory Visit (INDEPENDENT_AMBULATORY_CARE_PROVIDER_SITE_OTHER): Payer: Medicare HMO | Admitting: *Deleted

## 2017-08-05 DIAGNOSIS — I059 Rheumatic mitral valve disease, unspecified: Secondary | ICD-10-CM | POA: Diagnosis not present

## 2017-08-05 DIAGNOSIS — Z5181 Encounter for therapeutic drug level monitoring: Secondary | ICD-10-CM

## 2017-08-05 DIAGNOSIS — Z954 Presence of other heart-valve replacement: Secondary | ICD-10-CM

## 2017-08-05 LAB — POCT INR: INR: 3.3

## 2017-08-05 NOTE — Patient Instructions (Signed)
Description   Continue taking 1.5 tablets daily except 1 tablet on Mondays, Wednesdays, and Fridays. Recheck INR in 6 weeks. Call with any questions new medications or procedures  [phone]      

## 2017-09-07 ENCOUNTER — Other Ambulatory Visit: Payer: Self-pay | Admitting: Internal Medicine

## 2017-09-07 MED ORDER — FUROSEMIDE 20 MG PO TABS: 20.0000 mg | ORAL_TABLET | Freq: Every day | ORAL | 2 refills | Status: DC

## 2017-09-16 ENCOUNTER — Ambulatory Visit (INDEPENDENT_AMBULATORY_CARE_PROVIDER_SITE_OTHER): Payer: Medicare HMO

## 2017-09-16 DIAGNOSIS — I059 Rheumatic mitral valve disease, unspecified: Secondary | ICD-10-CM

## 2017-09-16 DIAGNOSIS — Z954 Presence of other heart-valve replacement: Secondary | ICD-10-CM | POA: Diagnosis not present

## 2017-09-16 DIAGNOSIS — Z5181 Encounter for therapeutic drug level monitoring: Secondary | ICD-10-CM

## 2017-09-16 LAB — POCT INR: INR: 2.6 (ref 2.0–3.0)

## 2017-09-16 NOTE — Patient Instructions (Signed)
Description   Continue taking 1.5 tablets daily except 1 tablet on Mondays, Wednesdays, and Fridays. Recheck INR in 6 weeks. Call with any questions new medications or procedures  [phone]      

## 2017-10-28 ENCOUNTER — Ambulatory Visit (INDEPENDENT_AMBULATORY_CARE_PROVIDER_SITE_OTHER): Payer: Medicare HMO | Admitting: Pharmacist

## 2017-10-28 DIAGNOSIS — Z954 Presence of other heart-valve replacement: Secondary | ICD-10-CM | POA: Diagnosis not present

## 2017-10-28 DIAGNOSIS — Z5181 Encounter for therapeutic drug level monitoring: Secondary | ICD-10-CM

## 2017-10-28 DIAGNOSIS — I059 Rheumatic mitral valve disease, unspecified: Secondary | ICD-10-CM | POA: Diagnosis not present

## 2017-10-28 LAB — POCT INR: INR: 2.8 (ref 2.0–3.0)

## 2017-10-28 NOTE — Patient Instructions (Signed)
Description   Continue taking 1.5 tablets daily except 1 tablet on Mondays, Wednesdays, and Fridays. Recheck INR in 6 weeks. Call with any questions new medications or procedures  [phone]

## 2017-11-09 ENCOUNTER — Encounter: Payer: Self-pay | Admitting: Physician Assistant

## 2017-11-09 ENCOUNTER — Ambulatory Visit: Payer: Medicare HMO | Admitting: Physician Assistant

## 2017-11-09 VITALS — BP 120/80 | HR 79 | Ht 65.0 in | Wt 204.8 lb

## 2017-11-09 DIAGNOSIS — E785 Hyperlipidemia, unspecified: Secondary | ICD-10-CM | POA: Diagnosis not present

## 2017-11-09 DIAGNOSIS — I5032 Chronic diastolic (congestive) heart failure: Secondary | ICD-10-CM

## 2017-11-09 DIAGNOSIS — Z954 Presence of other heart-valve replacement: Secondary | ICD-10-CM | POA: Diagnosis not present

## 2017-11-09 DIAGNOSIS — J449 Chronic obstructive pulmonary disease, unspecified: Secondary | ICD-10-CM | POA: Diagnosis not present

## 2017-11-09 NOTE — Patient Instructions (Signed)
Medication Instructions:  1. Your physician recommends that you continue on your current medications as directed. Please refer to the Current Medication list given to you today.   Labwork: NONE ORDERED TODAY  Testing/Procedures: NONE ORDERED TODAY  Follow-Up: 6 MONTHS WIT SCOTT WEAVER, PAC   Any Other Special Instructions Will Be Listed Below (If Applicable).     If you need a refill on your cardiac medications before your next appointment, please call your pharmacy.

## 2017-11-09 NOTE — Progress Notes (Signed)
Cardiology Office Note:    Date:  11/09/2017   ID:  Priscilla Stevens, DOB 06-07-1950, MRN [id number]  PCP:  Priscilla Noon, MD  Cardiologist:  Priscilla Bush, MD  Pulmonologist:  Priscilla Gully, MD   Referring MD: Priscilla Noon, MD   Chief Complaint  Priscilla Stevens presents with  . Follow-up    Hx of Mechanical MVR; Diastolic CHF    History of Present Illness:    Priscilla Stevens is a 67 y.o. 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 She did not have significant CAD on cath prior to MVR.  Of note she is not able to take ASA due to GI side effects.    Echocardiogram in November 2018 demonstrated normal LV function and normally functioning mechanical mitral valve prosthesis.  She was last seen by Priscilla Stevens in 05/2017.    Priscilla Stevens returns for follow-up on diastolic heart failure and mitral valve disease.  She is here alone.  Since last seen, she has done well.  She denies shortness of breath, chest discomfort, dizziness or syncope, orthopnea, PND or significant lower extremity swelling.  She denies any bleeding issues.  Prior CV studies:   The following studies were reviewed today:  Echo 03/02/17 EF 60-65, no RWMA, mechanical MVR functioning normally (no MR, no MS; mean 5), mild LAE, normal RVSF  PFTs 7/17 FEV1 45% predicted; FEV1/FVC 67%, uncorr DLCO 61% predicted >> Severe obstructive airways disease suggestive of emphysema.  Echo 9/16 Vigorous LVF, EF 65-70, mechanical MVR okay with peak and mean gradients 12 and 6 mmHg respectively, mild LAE  Echo 08/2012 Mild LVH, EF 55-60, trivial AI, mechanical MVR ok with Peak and meangradients across valve are 9 and 6 mm Hg respectivelywhich is normal, mild LAE   Carotid US 06/2012 No significant extracranial carotid artery stenosisdemonstrated.  LHC 06/2012 Coronary angiography: Coronary dominance:  right Left mainstem: No significant disease.  Left anterior descending (LAD): No significant disease.  Left circumflex (LCx): 30% ostial LCx.  Right coronary artery (RCA): No significant disease.  Left ventriculography: Left ventricular systolic function is normal, LVEF is estimated at 60-65% with no wall motion abnormalities in the RAO projection. 3+ mitral regurgitation.   Past Medical History:  Diagnosis Date  . Acute on chronic diastolic heart failure ([place]) 06/22/2012  . Anxiety   . CHF (congestive heart failure) ([place])   . Chronic diastolic congestive heart failure ([place]) 07/07/2012  . CLL (chronic lymphocytic leukemia) ([place])   . Complication of anesthesia   . Cough    Eval by pulm in past: essentially normal PFTs, cough ddx was variant asthma, upper airway cough syndrome (previously termed post nasal drip syndrome) or GERD,   . Diverticulitis   . Dysrhythmia    FAST HB TAKING METOPROLOL NOT FOR HTN  . Fibromyalgia   . GERD (gastroesophageal reflux disease)   . Headache(784.0)    HX MIGRAINES   . Heart murmur   . History of echocardiogram    Echo 9/16:  Vigorous LVF, EF 65-70%, MVR ok (peak 12 mmHg, mean 6 mmHg), mild LAE, normal RVF.  Marland Kitchen History of tobacco abuse 06/22/2012  . Mitral stenosis 06/20/2012  . Morbid obesity ([place]) 06/20/2012  . Pneumonia    2-3 YRS AGO   . PONV (postoperative nausea and vomiting)   . S/P mitral valve replacement with metallic valve 12/07/8180   29 mm Sorin [employer]omedics mechanical valve  .  Severe mitral regurgitation 06/20/2012  1. Depression 2. Rheumatic heart disease: Priscilla Stevens developed moderate MS and moderate to severe MR. 4/14 he had MV replacement with #29 Sorin [employer]omed[employer] mitral valve. Echo (5/14) with EF 55-60%, mild LVH, mechanical mitral valve with mean gradient 6 mmHg. Echo (9/16) with EF 65-70%, mechanical mitral valve, mean gradient 6 mmHg.  3. Morbid obesity 4. CLL 5. COPD 6. Fibromyalgia 7. LHC (3/14) with no significant CAD.  8.  Medication-related QT prolongation  Surgical Hx: The Priscilla Stevens  has a past surgical history that includes Appendectomy; Total vaginal hysterectomy; Tonsillectomy; TEE without cardioversion (N/A, 06/22/2012); No past surgeries; Intraoprative transesophageal echocardiogram (N/A, 07/07/2012); Mitral valve replacement (N/A, 07/07/2012); and left and right heart catheterization with coronary angiogram (N/A, 06/21/2012).   Current Medications: Current Meds  Medication Sig  . albuterol (PROVENTIL HFA;VENTOLIN HFA) 108 (90 BASE) MCG/ACT inhaler Inhale 2 puffs into the lungs every 6 (six) hours as needed for wheezing or shortness of breath.  . ALPRAZolam (XANAX) 0.25 MG tablet Take 1 tablet (0.25 mg total) by mouth at bedtime as needed for sleep or anxiety.  Marland Kitchen amoxicillin (AMOXIL) 500 MG tablet 4 tablets (2 G) 30-60 minutes prior to dental appointment.  . calcium carbonate (OS-CAL) 600 MG TABS Take 600 mg by mouth 2 (two) times daily with a meal.   . Esomeprazole Magnesium (NEXIUM PO) Take 20 mg by mouth daily.  Marland Kitchen ezetimibe (ZETIA) 10 MG tablet Take 10 mg by mouth daily.  . furosemide (LASIX) 20 MG tablet Take 1 tablet (20 mg total) by mouth daily.  Marland Kitchen HYDROcodone-acetaminophen (NORCO/VICODIN) 5-325 MG per tablet Take 1 tablet by mouth every 6 (six) hours as needed for pain.  . hydroxychloroquine (PLAQUENIL) 200 MG tablet Take 200 mg by mouth 2 (two) times daily.  . metoprolol succinate (TOPROL-XL) 25 MG 24 hr tablet TAKE 1 TABLET TWICE DAILY  . venlafaxine XR (EFFEXOR-XR) 37.5 MG 24 hr capsule Take 1 capsule by mouth daily.  Marland Kitchen warfarin (COUMADIN) 5 MG tablet TAKE AS DIRECTED FROM COUMADIN CLINIC     Allergies:   Statins and Aspirin   Social History   Tobacco Use  . Smoking status: Former Smoker    Packs/day: 1.00    Years: 36.00    Pack years: 36.00    Last attempt to quit: 04/06/2013    Years since quitting: 4.5  . Smokeless tobacco: Never Used  Substance Use Topics  . Alcohol use: Yes    Comment:  2x/year  . Drug use: No     Family Hx: The Priscilla Stevens's family history includes Cancer - Lung in her father; Congestive Heart Failure in her mother.  ROS:   Please see the history of present illness.    ROS All other systems reviewed and are negative.   EKGs/Labs/Other Test Reviewed:    EKG:  EKG is  ordered today.  The ekg ordered today demonstrates normal sinus rhythm, heart rate 79, normal axis, nonspecific ST-T wave changes, QTC 497, similar to old tracings  Recent Labs: 06/17/2017: BUN 12; Creatinine, Ser 0.91; Potassium 4.8; Sodium 143   Recent Lipid Panel Lab Results  Component Value Date/Time   CHOL 159 07/08/2015 03:10 PM   TRIG 214 (H) 07/08/2015 03:10 PM   HDL 52 07/08/2015 03:10 PM   CHOLHDL 3.1 07/08/2015 03:10 PM   LDLCALC 64 07/08/2015 03:10 PM    Physical Exam:    VS:  BP 120/80   Pulse 79   Ht 5\' 5"  (1.651 m)   Wt  204 lb 12.8 oz (92.9 kg)   SpO2 99%   BMI 34.08 kg/m     Wt Readings from Last 3 Encounters:  11/09/17 204 lb 12.8 oz (92.9 kg)  05/13/17 211 lb (95.7 kg)  02/12/17 216 lb (98 kg)     Physical Exam  Constitutional: She is oriented to person, place, and time. She appears well-developed and well-nourished. No distress.  HENT:  Head: Normocephalic and atraumatic.  Eyes: No scleral icterus.  Neck: No JVD present.  Cardiovascular: Normal rate, regular rhythm and S2 normal.  No murmur heard. Mechanical S1  Pulmonary/Chest: Effort normal. She has no wheezes. She has no rales.  Abdominal: Soft. She exhibits no distension.  Musculoskeletal: She exhibits no edema.  Neurological: She is alert and oriented to person, place, and time.  Skin: Skin is warm and dry.  Psychiatric: She has a normal mood and affect.    ASSESSMENT & PLAN:    Chronic diastolic congestive heart failure ([place])  Echo on 11/18 demonstrated EF 60-65%.  She is NYHA 2.  Volume status is stable.  Continue current management.  S/P mitral valve replacement with metallic  valve Normally functioning mitral valve prosthesis by echocardiogram November 2018.  She remains on Coumadin.  She is not on aspirin due to intolerance.  Continue SBE prophylaxis.  Chronic obstructive pulmonary disease, unspecified COPD type ([place]) Continue follow-up with pulmonology as directed.  Hyperlipidemia, unspecified hyperlipidemia type Managed by primary care.  Dispo:  Return in about 6 months (around 05/12/2018) for Routine Follow Up, w/ Richardson Dopp, PA-C.   Medication Adjustments/Labs and Tests Ordered: Current medicines are reviewed at length with the Priscilla Stevens today.  Concerns regarding medicines are outlined above.  Tests Ordered: Orders Placed This Encounter  Procedures  . EKG 12-Lead   Medication Changes: No orders of the defined types were placed in this encounter.   Signed, Richardson Dopp, PA-C  11/09/2017 10:44 AM    [facility] [place], [place], [place]  [address] Phone: [phone]; Fax: [phone]

## 2017-12-09 ENCOUNTER — Ambulatory Visit (INDEPENDENT_AMBULATORY_CARE_PROVIDER_SITE_OTHER): Payer: Medicare HMO | Admitting: *Deleted

## 2017-12-09 DIAGNOSIS — Z954 Presence of other heart-valve replacement: Secondary | ICD-10-CM | POA: Diagnosis not present

## 2017-12-09 DIAGNOSIS — I059 Rheumatic mitral valve disease, unspecified: Secondary | ICD-10-CM | POA: Diagnosis not present

## 2017-12-09 DIAGNOSIS — Z5181 Encounter for therapeutic drug level monitoring: Secondary | ICD-10-CM | POA: Diagnosis not present

## 2017-12-09 LAB — POCT INR: INR: 3.6 — AB (ref 2.0–3.0)

## 2017-12-09 NOTE — Patient Instructions (Addendum)
Description   Today take 1 tablet then continue taking 1.5 tablets daily except 1 tablet on Mondays, Wednesdays, and Fridays. Recheck INR in 5 weeks. Call with any questions new medications or procedures  [phone]

## 2018-01-13 ENCOUNTER — Ambulatory Visit (INDEPENDENT_AMBULATORY_CARE_PROVIDER_SITE_OTHER): Payer: Medicare HMO | Admitting: *Deleted

## 2018-01-13 DIAGNOSIS — Z5181 Encounter for therapeutic drug level monitoring: Secondary | ICD-10-CM

## 2018-01-13 DIAGNOSIS — I059 Rheumatic mitral valve disease, unspecified: Secondary | ICD-10-CM | POA: Diagnosis not present

## 2018-01-13 DIAGNOSIS — Z954 Presence of other heart-valve replacement: Secondary | ICD-10-CM | POA: Diagnosis not present

## 2018-01-13 LAB — POCT INR: INR: 4 — AB (ref 2.0–3.0)

## 2018-01-13 NOTE — Patient Instructions (Signed)
Description   Skip today's dose, then continue taking 1.5 tablets daily except 1 tablet on Mondays, Wednesdays, and Fridays. Recheck INR in 2-3 weeks. Call with any questions new medications or procedures  [phone]

## 2018-01-27 ENCOUNTER — Ambulatory Visit (INDEPENDENT_AMBULATORY_CARE_PROVIDER_SITE_OTHER): Payer: Medicare HMO | Admitting: Pharmacist

## 2018-01-27 DIAGNOSIS — I059 Rheumatic mitral valve disease, unspecified: Secondary | ICD-10-CM

## 2018-01-27 DIAGNOSIS — Z5181 Encounter for therapeutic drug level monitoring: Secondary | ICD-10-CM | POA: Diagnosis not present

## 2018-01-27 DIAGNOSIS — Z954 Presence of other heart-valve replacement: Secondary | ICD-10-CM

## 2018-01-27 LAB — POCT INR: INR: 3.2 — AB (ref 2.0–3.0)

## 2018-01-27 NOTE — Patient Instructions (Signed)
Description   Continue taking 1.5 tablets daily except 1 tablet on Mondays, Wednesdays, and Fridays. Recheck INR in 3 weeks. Call with any questions new medications or procedures  [phone]

## 2018-02-15 ENCOUNTER — Telehealth: Payer: Self-pay | Admitting: Internal Medicine

## 2018-02-15 MED ORDER — AMOXICILLIN 500 MG PO TABS
ORAL_TABLET | ORAL | 6 refills | Status: DC
Start: 2018-02-15 — End: 2018-02-21

## 2018-02-15 NOTE — Telephone Encounter (Signed)
Pt is requesting a refill on amoxicillin. Would Richardson Dopp, PA like to refill this medication? Please address

## 2018-02-15 NOTE — Telephone Encounter (Signed)
New message   *STAT* If patient is at the pharmacy, call can be transferred to refill team.   1. Which medications need to be refilled? (please list name of each medication and dose if known) amoxicillin (AMOXIL) 500 MG tablet  2. Which pharmacy/location (including street and city if local pharmacy) is medication to be sent to?[place], [place]  3. Do they need a 30 day or 90 day supply? [place]

## 2018-02-15 NOTE — Telephone Encounter (Signed)
Ok to Rx: Amoxicillin 500 mg take 4 tablets (2000 mg) 30-60 minutes prior to dental procedure. #4, 6 refills Richardson Dopp, PA-C    02/15/2018 12:00 PM

## 2018-02-15 NOTE — Telephone Encounter (Signed)
Pt's medication was sent to pt's pharmacy as requested. Confirmation received.  °

## 2018-02-16 ENCOUNTER — Other Ambulatory Visit: Payer: Self-pay | Admitting: Cardiology

## 2018-02-17 ENCOUNTER — Ambulatory Visit (INDEPENDENT_AMBULATORY_CARE_PROVIDER_SITE_OTHER): Payer: Medicare HMO

## 2018-02-17 DIAGNOSIS — I059 Rheumatic mitral valve disease, unspecified: Secondary | ICD-10-CM | POA: Diagnosis not present

## 2018-02-17 DIAGNOSIS — Z954 Presence of other heart-valve replacement: Secondary | ICD-10-CM

## 2018-02-17 DIAGNOSIS — Z5181 Encounter for therapeutic drug level monitoring: Secondary | ICD-10-CM | POA: Diagnosis not present

## 2018-02-17 LAB — POCT INR: INR: 3.8 — AB (ref 2.0–3.0)

## 2018-02-17 NOTE — Patient Instructions (Signed)
Description   Skip today's dosage of Coumadin, then resume 1.5 tablets daily except 1 tablet on Mondays, Wednesdays, and Fridays. Recheck INR in 3 weeks. Call with any questions new medications or procedures  [phone]

## 2018-02-21 ENCOUNTER — Other Ambulatory Visit: Payer: Self-pay | Admitting: Internal Medicine

## 2018-02-21 MED ORDER — AMOXICILLIN 500 MG PO TABS: ORAL_TABLET | ORAL | 6 refills | Status: DC

## 2018-02-21 NOTE — Telephone Encounter (Signed)
 *  STAT* If patient is at the pharmacy, call can be transferred to refill team.   1. Which medications need to be refilled? (please list name of each medication and dose if known) amoxicillin (AMOXIL) 500 MG tablet  2. Which pharmacy/location (including street and city if local pharmacy) is medication to be sent to? 4 tablets (2 G) 30-60 minutes prior to dental appointment  3. Do they need a 30 day or 90 day supply? NA  Patient called on 02/15/18 regarding a request for amoxicillan to be called in for her. The script was called into [facility] but she needs it called into [facility] in [place] so she can go ahead and pick it up before her dentist appt. She cancelled the script with [facility] because it was still in process.

## 2018-02-21 NOTE — Telephone Encounter (Signed)
Pt's medication was resent to the correction pharmacy as requested. Confirmation received.

## 2018-03-10 ENCOUNTER — Ambulatory Visit (INDEPENDENT_AMBULATORY_CARE_PROVIDER_SITE_OTHER): Payer: Medicare HMO | Admitting: *Deleted

## 2018-03-10 DIAGNOSIS — Z5181 Encounter for therapeutic drug level monitoring: Secondary | ICD-10-CM

## 2018-03-10 DIAGNOSIS — Z954 Presence of other heart-valve replacement: Secondary | ICD-10-CM

## 2018-03-10 DIAGNOSIS — I059 Rheumatic mitral valve disease, unspecified: Secondary | ICD-10-CM | POA: Diagnosis not present

## 2018-03-10 LAB — POCT INR: INR: 4.6 — AB (ref 2.0–3.0)

## 2018-03-10 NOTE — Patient Instructions (Signed)
Description   Skip today's dosage of Coumadin, then start taking 1 tablet daily except 1.5 tablets on Tuesdays, Thursdays and Sunday. Recheck  in 2-3 weeks. Call with any questions new medications or procedures  [phone]

## 2018-03-15 ENCOUNTER — Other Ambulatory Visit: Payer: Self-pay | Admitting: Internal Medicine

## 2018-03-16 NOTE — Telephone Encounter (Signed)
Please review for refill.  

## 2018-03-25 ENCOUNTER — Ambulatory Visit (INDEPENDENT_AMBULATORY_CARE_PROVIDER_SITE_OTHER): Payer: Medicare HMO | Admitting: Pharmacist

## 2018-03-25 DIAGNOSIS — Z5181 Encounter for therapeutic drug level monitoring: Secondary | ICD-10-CM

## 2018-03-25 DIAGNOSIS — Z954 Presence of other heart-valve replacement: Secondary | ICD-10-CM

## 2018-03-25 DIAGNOSIS — I059 Rheumatic mitral valve disease, unspecified: Secondary | ICD-10-CM | POA: Diagnosis not present

## 2018-03-25 LAB — POCT INR: INR: 4.2 — AB (ref 2.0–3.0)

## 2018-03-25 NOTE — Patient Instructions (Signed)
Skip today's dosage of Coumadin, then start taking 1 tablet daily except 1.5 tablets on Wendsday and Sunday. Recheck  in 2-3 weeks. Call with any questions new medications or procedures  [phone]

## 2018-04-12 ENCOUNTER — Ambulatory Visit (INDEPENDENT_AMBULATORY_CARE_PROVIDER_SITE_OTHER): Payer: Medicare HMO

## 2018-04-12 DIAGNOSIS — Z954 Presence of other heart-valve replacement: Secondary | ICD-10-CM | POA: Diagnosis not present

## 2018-04-12 DIAGNOSIS — I059 Rheumatic mitral valve disease, unspecified: Secondary | ICD-10-CM

## 2018-04-12 DIAGNOSIS — Z5181 Encounter for therapeutic drug level monitoring: Secondary | ICD-10-CM

## 2018-04-12 LAB — POCT INR: INR: 2.8 (ref 2.0–3.0)

## 2018-04-12 NOTE — Patient Instructions (Signed)
Please continue taking 1 tablet daily except 1.5 tablets on Wednesdays and Sundays. Recheck  in 4 weeks. Call with any questions new medications or procedures  [phone]

## 2018-05-09 NOTE — Progress Notes (Signed)
Cardiology Office Note:    Date:  05/10/2018   ID:  Priscilla Stevens, DOB 05/14/1950, MRN [id number]  PCP:  Priscilla Noon, MD  Cardiologist:  Dorris Carnes, MD / Richardson Dopp, PA-C  Electrophysiologist:  None  Pulmonologist:  Christinia Gully, MD  Referring MD: Priscilla Noon, MD   Chief Complaint  Patient presents with  . Follow-up    Hx of MVR and CHF     History of Present Illness:    Priscilla Stevens is a 68 y.o. 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 She did not have significant CAD on cath prior to MVR.Of note she is not able to take ASA due to GI side effects.  Echocardiogram in November 2018 demonstrated normal LV function and normally functioning mechanical mitral valve prosthesis.    Priscilla Stevens returns for cardiology follow-up.  Since last seen she has been doing well.  She has not had chest discomfort or significant dyspnea with exertion.  She has not had syncope, paroxysmal atrial dyspnea or significant lower extremity swelling.  She denies any bleeding issues.  She is doing well with warfarin.  She has had a recent cough and congestion along with some wheezing.  She denies fevers or purulent sputum.  Her symptoms have been improving.    Prior CV studies:   The following studies were reviewed today:  Echo 03/02/17 EF 60-65, no RWMA, mechanical MVR functioning normally (no MR, no MS; mean 5), mild LAE, normal RVSF  PFTs 7/17 FEV1 45% predicted; FEV1/FVC 67%, uncorr DLCO 61% predicted >>Severe obstructive airways disease suggestive of emphysema.  Echo 9/16 Vigorous LVF, EF 65-70, mechanical MVR okay with peak and mean gradients 12 and 6 mmHg respectively, mild LAE  Echo 08/2012 Mild LVH, EF 55-60, trivial AI, mechanical MVR ok withPeak and meangradients across valve are 9 and 6 mm Hg respectivelywhich is normal, mild LAE  Carotid US  06/2012 No significant extracranial carotid artery stenosisdemonstrated.  LHC 06/2012 Coronary angiography: Coronary dominance: right Left mainstem: No significant disease.  Left anterior descending (LAD): No significant disease.  Left circumflex (LCx): 30% ostial LCx.  Right coronary artery (RCA): No significant disease.  Left ventriculography: Left ventricular systolic function is normal, LVEF is estimated at 60-65% with no wall motion abnormalities in the RAO projection. 3+ mitral regurgitation.   Past Medical History:  Diagnosis Date  . Acute on chronic diastolic heart failure ([place]) 06/22/2012  . Anxiety   . CHF (congestive heart failure) ([place])   . Chronic diastolic congestive heart failure ([place]) 07/07/2012  . CLL (chronic lymphocytic leukemia) ([place])   . Complication of anesthesia   . Cough    Eval by pulm in past: essentially normal PFTs, cough ddx was variant asthma, upper airway cough syndrome (previously termed post nasal drip syndrome) or GERD,   . Diverticulitis   . Dysrhythmia    FAST HB TAKING METOPROLOL NOT FOR HTN  . Fibromyalgia   . GERD (gastroesophageal reflux disease)   . Headache(784.0)    HX MIGRAINES   . Heart murmur   . History of echocardiogram    Echo 9/16:  Vigorous LVF, EF 65-70%, MVR ok (peak 12 mmHg, mean 6 mmHg), mild LAE, normal RVF.  Marland Kitchen History of tobacco abuse 06/22/2012  . Mitral stenosis 06/20/2012  . Morbid obesity ([place]) 06/20/2012  . Pneumonia    2-3 YRS AGO   . PONV (postoperative nausea and  vomiting)   . S/P mitral valve replacement with metallic valve 04/11/6061   29 mm Sorin [employer]omed[employer] mechanical valve  . Severe mitral regurgitation 06/20/2012  1. Depression 2. Rheumatic heart disease: Patient developed moderate MS and moderate to severe MR. 4/14 he had MV replacement with #29 Sorin [employer]omed[employer] mitral valve. Echo (5/14) with EF 55-60%, mild LVH, mechanical mitral valve with mean gradient 6 mmHg. Echo (9/16) with EF 65-70%, mechanical  mitral valve, mean gradient 6 mmHg.  3. Morbid obesity 4. CLL 5. COPD 6. Fibromyalgia 7. LHC (3/14) with no significant CAD.  8. Medication-related QT prolongation  Surgical Hx: The patient  has a past surgical history that includes Appendectomy; Total vaginal hysterectomy; Tonsillectomy; TEE without cardioversion (N/A, 06/22/2012); No past surgeries; Intraoprative transesophageal echocardiogram (N/A, 07/07/2012); Mitral valve replacement (N/A, 07/07/2012); and left and right heart catheterization with coronary angiogram (N/A, 06/21/2012).   Current Medications: Current Meds  Medication Sig  . albuterol (PROVENTIL HFA;VENTOLIN HFA) 108 (90 BASE) MCG/ACT inhaler Inhale 2 puffs into the lungs every 6 (six) hours as needed for wheezing or shortness of breath.  . ALPRAZolam (XANAX) 0.25 MG tablet Take 1 tablet (0.25 mg total) by mouth at bedtime as needed for sleep or anxiety.  Marland Kitchen amoxicillin (AMOXIL) 500 MG tablet 4 tablets (2 G) 30-60 minutes prior to dental appointment.  . calcium carbonate (OS-CAL) 600 MG TABS Take 600 mg by mouth 2 (two) times daily with a meal.   . furosemide (LASIX) 20 MG tablet TAKE 1 TABLET (20 MG TOTAL) BY MOUTH DAILY.  Marland Kitchen HYDROcodone-acetaminophen (NORCO/VICODIN) 5-325 MG per tablet Take 1 tablet by mouth every 6 (six) hours as needed for pain.  . metoprolol succinate (TOPROL-XL) 25 MG 24 hr tablet TAKE 1 TABLET TWICE DAILY  . warfarin (COUMADIN) 5 MG tablet TAKE AS DIRECTED FROM COUMADIN CLINIC     Allergies:   Statins and Aspirin   Social History   Tobacco Use  . Smoking status: Former Smoker    Packs/day: 1.00    Years: 36.00    Pack years: 36.00    Last attempt to quit: 04/06/2013    Years since quitting: 5.0  . Smokeless tobacco: Never Used  Substance Use Topics  . Alcohol use: Yes    Comment: 2x/year  . Drug use: No     Family Hx: The patient's family history includes Cancer - Lung in her father; Congestive Heart Failure in her mother.  ROS:   Please  see the history of present illness.    ROS All other systems reviewed and are negative.   EKGs/Labs/Other Test Reviewed:    EKG:  EKG is  ordered today.  The ekg ordered today demonstrates normal sinus rhythm, heart rate 76, normal axis, nonspecific ST-T wave changes, QTC 488   Recent Labs: 06/17/2017: BUN 12; Creatinine, Ser 0.91; Potassium 4.8; Sodium 143   Recent Lipid Panel Lab Results  Component Value Date/Time   CHOL 159 07/08/2015 03:10 PM   TRIG 214 (H) 07/08/2015 03:10 PM   HDL 52 07/08/2015 03:10 PM   CHOLHDL 3.1 07/08/2015 03:10 PM   LDLCALC 64 07/08/2015 03:10 PM    Physical Exam:    VS:  BP 138/60   Pulse 76   Ht 5\' 5"  (1.651 m)   Wt 196 lb 1.9 oz (89 kg)   SpO2 94%   BMI 32.64 kg/m     Wt Readings from Last 3 Encounters:  05/10/18 196 lb 1.9 oz (89 kg)  11/09/17 204 lb 12.8  oz (92.9 kg)  05/13/17 211 lb (95.7 kg)     Physical Exam  Constitutional: She is oriented to person, place, and time. She appears well-developed and well-nourished. No distress.  HENT:  Head: Normocephalic and atraumatic.  Neck: Neck supple. No JVD present.  Cardiovascular: Normal rate, regular rhythm, S2 normal and normal heart sounds.  No murmur heard. Mechanical S1  Pulmonary/Chest: Effort normal. She has no rales.  Abdominal: Soft. There is no hepatomegaly.  Musculoskeletal:        General: No edema.  Neurological: She is alert and oriented to person, place, and time.  Skin: Skin is warm and dry.    ASSESSMENT & PLAN:    S/P mitral valve replacement with metallic valve Normally functioning valve by echo in November 2018.  Continue long-term anticoagulation with Warfarin.  She is not on aspirin due to intolerance.  Continue SBE prophylaxis.  Given Dr. Darnelle Bos transition to [facility], I will follow her here along with Dr. Harrington Challenger.  FU in 6 mos.   Chronic diastolic congestive heart failure (HCC) Volume status stable.  EF 60-65 by echo November 2018.  NYHA 2.  Continue current  therapy.  Hyperlipidemia ASCVD ten-year risk 8.9%.  She has been intolerant to statins and now ezetimibe.  Lipids are managed by primary care.  She may be a candidate for PCSK9 inhibitor therapy.   Dispo:  Return in about 6 months (around 11/08/2018) for Routine Follow Up, w/ Dr. Harrington Challenger, or Richardson Dopp, PA-C.   Medication Adjustments/Labs and Tests Ordered: Current medicines are reviewed at length with the patient today.  Concerns regarding medicines are outlined above.  Tests Ordered: Orders Placed This Encounter  Procedures  . EKG 12-Lead   Medication Changes: No orders of the defined types were placed in this encounter.   Signed, Richardson Dopp, PA-C  05/10/2018 12:45 PM    [facility] [place], [place], [place]  [address] Phone: [phone]; Fax: [phone]

## 2018-05-10 ENCOUNTER — Ambulatory Visit: Payer: Medicare HMO | Admitting: Physician Assistant

## 2018-05-10 ENCOUNTER — Encounter: Payer: Self-pay | Admitting: Physician Assistant

## 2018-05-10 ENCOUNTER — Ambulatory Visit (INDEPENDENT_AMBULATORY_CARE_PROVIDER_SITE_OTHER): Payer: Medicare HMO

## 2018-05-10 VITALS — BP 138/60 | HR 76 | Ht 65.0 in | Wt 196.1 lb

## 2018-05-10 DIAGNOSIS — Z5181 Encounter for therapeutic drug level monitoring: Secondary | ICD-10-CM | POA: Diagnosis not present

## 2018-05-10 DIAGNOSIS — Z954 Presence of other heart-valve replacement: Secondary | ICD-10-CM

## 2018-05-10 DIAGNOSIS — I059 Rheumatic mitral valve disease, unspecified: Secondary | ICD-10-CM | POA: Diagnosis not present

## 2018-05-10 DIAGNOSIS — E785 Hyperlipidemia, unspecified: Secondary | ICD-10-CM | POA: Diagnosis not present

## 2018-05-10 DIAGNOSIS — I5032 Chronic diastolic (congestive) heart failure: Secondary | ICD-10-CM

## 2018-05-10 LAB — POCT INR: INR: 3.8 — AB (ref 2.0–3.0)

## 2018-05-10 NOTE — Patient Instructions (Signed)
Please skip coumadin tonight, then continue taking 1 tablet daily except 1.5 tablets on Wednesdays and Sundays. Recheck  in 4 weeks. Call with any questions new medications or procedures  [phone]

## 2018-05-10 NOTE — Patient Instructions (Signed)
Medication Instructions:  Your physician recommends that you continue on your current medications as directed. Please refer to the Current Medication list given to you today.  If you need a refill on your cardiac medications before your next appointment, please call your pharmacy.   Lab work: NONE If you have labs (blood work) drawn today and your tests are completely normal, you will receive your results only by: . MyChart Message (if you have MyChart) OR . A paper copy in the mail If you have any lab test that is abnormal or we need to change your treatment, we will call you to review the results.  Testing/Procedures: NONE  Follow-Up: At [facility], you and your health needs are our priority.  As part of our continuing mission to provide you with exceptional heart care, we have created designated Provider Care Teams.  These Care Teams include your primary Cardiologist (physician) and Advanced Practice Providers (APPs -  Physician Assistants and Nurse Practitioners) who all work together to provide you with the care you need, when you need it. You will need a follow up appointment in:  6 months.  Please call our office 2 months in advance to schedule this appointment.  You may see Paula Ross, MD or one of the following Advanced Practice Providers on your designated Care Team: Scott Weaver, PA-C Vin Bhagat, PA-C . Janine Hammond, NP  Any Other Special Instructions Will Be Listed Below (If Applicable).    

## 2018-05-21 ENCOUNTER — Emergency Department (INDEPENDENT_AMBULATORY_CARE_PROVIDER_SITE_OTHER)
Admission: EM | Admit: 2018-05-21 | Discharge: 2018-05-21 | Disposition: A | Discharge status: Home / Self Care | Payer: Medicare HMO | Source: Home / Self Care

## 2018-05-21 ENCOUNTER — Other Ambulatory Visit: Payer: Self-pay

## 2018-05-21 DIAGNOSIS — J069 Acute upper respiratory infection, unspecified: Secondary | ICD-10-CM | POA: Diagnosis not present

## 2018-05-21 DIAGNOSIS — H9201 Otalgia, right ear: Secondary | ICD-10-CM | POA: Diagnosis not present

## 2018-05-21 DIAGNOSIS — Z7901 Long term (current) use of anticoagulants: Secondary | ICD-10-CM

## 2018-05-21 DIAGNOSIS — H6501 Acute serous otitis media, right ear: Secondary | ICD-10-CM | POA: Diagnosis not present

## 2018-05-21 MED ORDER — FLUTICASONE PROPIONATE 50 MCG/ACT NA SUSP: 2.0000 | Freq: Every day | NASAL | 12 refills | Status: DC

## 2018-05-21 MED ORDER — HYDROCODONE-ACETAMINOPHEN 5-325 MG PO TABS: 1.0000 | ORAL_TABLET | Freq: Four times a day (QID) | ORAL | 0 refills | Status: DC | PRN

## 2018-05-21 MED ORDER — AMOXICILLIN-POT CLAVULANATE 875-125 MG PO TABS: 1.0000 | ORAL_TABLET | Freq: Two times a day (BID) | ORAL | 0 refills | Status: DC

## 2018-05-21 MED ORDER — PREDNISONE 20 MG PO TABS: ORAL_TABLET | ORAL | 0 refills | Status: DC

## 2018-05-21 NOTE — ED Triage Notes (Signed)
Pt c/o RT earache since Thurs. Also c/o sinus pain/pressure. Tried OTC ear drops with no relief. Took tylenol at noon today as well.

## 2018-05-21 NOTE — ED Provider Notes (Addendum)
Priscilla [facility] CARE    CSN: [id number] Arrival date & time: 05/21/18  1433     History   Chief Complaint Chief Complaint  Patient presents with  . Otalgia    RT ear  . Facial Pain    sinuses    HPI Priscilla Stevens is a 68 y.o. female.   HPI Patient came in complaining of right ear pain for the last 2 days.  Had no significant cold prior to that but has had a little cold since then.  She has had some sinus symptoms.  She does not smoke.  She is coughing a little now.  Bad ear pain.  She is on anticoagulant therapy for her heart valve. Past Medical History:  Diagnosis Date  . Acute on chronic diastolic heart failure ([place]) 06/22/2012  . Anxiety   . CHF (congestive heart failure) ([place])   . Chronic diastolic congestive heart failure ([place]) 07/07/2012  . CLL (chronic lymphocytic leukemia) ([place])   . Complication of anesthesia   . Cough    Eval by pulm in past: essentially normal PFTs, cough ddx was variant asthma, upper airway cough syndrome (previously termed post nasal drip syndrome) or GERD,   . Diverticulitis   . Dysrhythmia    FAST HB TAKING METOPROLOL NOT FOR HTN  . Fibromyalgia   . GERD (gastroesophageal reflux disease)   . Headache(784.0)    HX MIGRAINES   . Heart murmur   . History of echocardiogram    Echo 9/16:  Vigorous LVF, EF 65-70%, MVR ok (peak 12 mmHg, mean 6 mmHg), mild LAE, normal RVF.  Marland Kitchen History of tobacco abuse 06/22/2012  . Mitral stenosis 06/20/2012  . Morbid obesity ([place]) 06/20/2012  . Pneumonia    2-3 YRS AGO   . PONV (postoperative nausea and vomiting)   . S/P mitral valve replacement with metallic valve 10/10/9388   29 mm Sorin Carbomedics mechanical valve  . Severe mitral regurgitation 06/20/2012    Patient Active Problem List   Diagnosis Date Noted  . Hyperlipidemia 05/13/2017  . Rheumatic heart disease 02/12/2017  . COPD GOLD II / AB  05/26/2016  . Encounter for therapeutic drug monitoring 04/27/2013  . QT prolongation 03/22/2013  .  Dyspnea 08/26/2012  . H/O mitral valve replacement 07/08/2012  . Chronic diastolic congestive heart failure ([place]) 07/07/2012  . S/P mitral valve replacement with metallic valve 30/12/2328  . Mitral valve disorders(424.0) 07/04/2012  . History of tobacco abuse 06/22/2012  . Pulmonary edema 06/20/2012  . Leucocytosis 06/20/2012  . Morbid obesity due to excess calories ([place]) 06/20/2012  . Mitral valve disease 06/20/2012  . Severe mitral regurgitation 06/20/2012  . Mitral stenosis 06/20/2012  . CLL (chronic lymphocytic leukemia) ([place])   . Sinus infection 12/02/2010  . MIGRAINE HEADACHE 02/16/2008  . GERD 02/16/2008  . FIBROMYALGIA 02/16/2008  . Cough 02/16/2008    Past Surgical History:  Procedure Laterality Date  . APPENDECTOMY    . INTRAOPERATIVE TRANSESOPHAGEAL ECHOCARDIOGRAM N/A 07/07/2012   Procedure: INTRAOPERATIVE TRANSESOPHAGEAL ECHOCARDIOGRAM;  Surgeon: Rexene Alberts, MD;  Location: [facility];  Service: Open Heart Surgery;  Laterality: N/A;  . LEFT AND RIGHT HEART CATHETERIZATION WITH CORONARY ANGIOGRAM N/A 06/21/2012   Procedure: LEFT AND RIGHT HEART CATHETERIZATION WITH CORONARY ANGIOGRAM;  Surgeon: Larey Dresser, MD;  Location: [facility] CATH LAB;  Service: Cardiovascular;  Laterality: N/A;  . MITRAL VALVE REPLACEMENT N/A 07/07/2012   Procedure: MITRAL VALVE (MV) REPLACEMENT;  Surgeon: Rexene Alberts, MD;  Location: [facility];  Service: Open Heart Surgery;  Laterality: N/A;  . NO PAST SURGERIES     TUBAL PREG   . TEE WITHOUT CARDIOVERSION N/A 06/22/2012   Procedure: TRANSESOPHAGEAL ECHOCARDIOGRAM (TEE);  Surgeon: Larey Dresser, MD;  Location: [facility];  Service: Cardiovascular;  Laterality: N/A;  . TONSILLECTOMY    . TOTAL VAGINAL HYSTERECTOMY      OB History   No obstetric history on file.      Home Medications    Prior to Admission medications   Medication Sig Start Date End Date Taking? Authorizing Provider  albuterol (PROVENTIL HFA;VENTOLIN HFA) 108 (90 BASE) MCG/ACT  inhaler Inhale 2 puffs into the lungs every 6 (six) hours as needed for wheezing or shortness of breath. 10/19/14   Richardson Dopp T, PA-C  ALPRAZolam Duanne Moron) 0.25 MG tablet Take 1 tablet (0.25 mg total) by mouth at bedtime as needed for sleep or anxiety. 07/04/12   Rexene Alberts, MD  amoxicillin (AMOXIL) 500 MG tablet 4 tablets (2 G) 30-60 minutes prior to dental appointment. 02/21/18   End, Harrell Gave, MD  amoxicillin-clavulanate (AUGMENTIN) 875-125 MG tablet Take 1 tablet by mouth every 12 (twelve) hours. 05/21/18   Posey Boyer, MD  calcium carbonate (OS-CAL) 600 MG TABS Take 600 mg by mouth 2 (two) times daily with a meal.     [provider]  fluticasone (FLONASE) 50 MCG/ACT nasal spray Place 2 sprays into both nostrils daily. 05/21/18   Posey Boyer, MD  furosemide (LASIX) 20 MG tablet TAKE 1 TABLET (20 MG TOTAL) BY MOUTH DAILY. 03/16/18   End, Harrell Gave, MD  HYDROcodone-acetaminophen (NORCO) 5-325 MG tablet Take 1 tablet by mouth every 6 (six) hours as needed. 05/21/18   Posey Boyer, MD  metoprolol succinate (TOPROL-XL) 25 MG 24 hr tablet TAKE 1 TABLET TWICE DAILY 03/16/18   End, Harrell Gave, MD  predniSONE (DELTASONE) 20 MG tablet Take 3 daily for 3 days.  Take with food. 05/21/18   Posey Boyer, MD  warfarin (COUMADIN) 5 MG tablet TAKE AS DIRECTED FROM COUMADIN CLINIC 02/16/18   Larey Dresser, MD    Family History Family History  Problem Relation Age of Onset  . Congestive Heart Failure Mother   . Cancer - Lung Father        smoked    Social History Social History   Tobacco Use  . Smoking status: Former Smoker    Packs/day: 1.00    Years: 36.00    Pack years: 36.00    Last attempt to quit: 04/06/2013    Years since quitting: 5.1  . Smokeless tobacco: Never Used  Substance Use Topics  . Alcohol use: Yes    Comment: 2x/year  . Drug use: No     Allergies   Statins and Aspirin   Review of Systems Review of Systems Constitutional:  Unremarkable HEENT: Right otalgia.  Sinus congestion.  No sore throat. Respiratory: Cough Cardiovascular: Unremarkable Neurologic: Unremarkable   Physical Exam Triage Vital Signs ED Triage Vitals  Enc Vitals Group     BP 05/21/18 1451 (!) 156/84     Pulse Rate 05/21/18 1451 84     Resp --      Temp 05/21/18 1451 97.7 F (36.5 C)     Temp Source 05/21/18 1451 Oral     SpO2 05/21/18 1451 97 %     Weight --      Height --      Head Circumference --      Peak Flow --  Pain Score 05/21/18 1452 3     Pain Loc --      Pain Edu? --      Excl. in [place]? --    No data found.  Updated Vital Signs BP (!) 156/84 (BP Location: Right Arm)   Pulse 84   Temp 97.7 F (36.5 C) (Oral)   Ht 5\' 5"  (1.651 m)   Wt 89.4 kg   SpO2 97%   BMI 32.78 kg/m   Visual Acuity Right Eye Distance:   Left Eye Distance:   Bilateral Distance:    Right Eye Near:   Left Eye Near:    Bilateral Near:     Physical Exam Pleasant lady, alert and oriented, no acute distress.  Cannot hear well from right ear.  Left TM normal.  Right ear canal is a little moist from some drops she is used.  The eardrum is bulging.  Amazingly it is not red.  Throat clear.  Neck supple without nodes.  Chest clear.  Heart rate regular  UC Treatments / Results  Labs (all labs ordered are listed, but only abnormal results are displayed) Labs Reviewed - No data to display  EKG None  Radiology No results found.  Procedures Procedures (including critical care time) None Medications Ordered in UC Medications - No data to display  Initial Impression / Assessment and Plan / UC Course  I have reviewed the triage vital signs and the nursing notes.  Pertinent labs & imaging results that were available during my care of the patient were reviewed by me and considered in my medical decision making (see chart for details).     See instructions.  Acute serous otitis probably from a eustachian tube obstruction and URI.   Otalgia.  Phonic anticoagulant therapy. Final Clinical Impressions(s) / UC Diagnoses   Final diagnoses:  Non-recurrent acute serous otitis media of right ear  Right ear pain  Acute upper respiratory infection  Long term current use of anticoagulant therapy     Discharge Instructions     Take prednisone 20 mg 3 pills daily for 3 days  Tylenol (acetaminophen) 500 mg 2 pills 3 times daily as needed for pain.  Take Norco (hydrocodone with acetaminophen 5/325) 1 every 6 hours as needed for pain.  You can only have a maximum of 6 acetaminophen in 24 hours, so if you take the hydrocodone you should subtract 1 pill from the maximum number of Tylenol you are allowed.  Fluticasone nose spray 2 sprays each nostril twice daily for 3 days, then once daily  Augmentin (amoxicillin/clavulanate) 1 twice daily for infection  Your warfarin test 2 days ago showed your blood to be a little too thin at 3.8.  I would recommend holding the dose tomorrow, then resuming your regular dose Monday.  You will be seeing your doctor at that time and can discuss further with him.    ED Prescriptions    Medication Sig Dispense Auth. Provider   amoxicillin-clavulanate (AUGMENTIN) 875-125 MG tablet Take 1 tablet by mouth every 12 (twelve) hours. 14 tablet Posey Boyer, MD   fluticasone [facility]) 50 MCG/ACT nasal spray Place 2 sprays into both nostrils daily. 16 g Posey Boyer, MD   predniSONE (DELTASONE) 20 MG tablet Take 3 daily for 3 days.  Take with food. 9 tablet Posey Boyer, MD   HYDROcodone-acetaminophen (NORCO) 5-325 MG tablet Take 1 tablet by mouth every 6 (six) hours as needed. 10 tablet Posey Boyer, MD  Controlled Substance Prescriptions [place] Controlled Substance Registry consulted? Yes, I have consulted the [place] Controlled Substances Registry for this patient, and feel the risk/benefit ratio today is favorable for proceeding with this prescription for a controlled substance.   Posey Boyer, MD 05/21/18 1531    Posey Boyer, MD 05/21/18 [phone]

## 2018-05-21 NOTE — Discharge Instructions (Addendum)
Take prednisone 20 mg 3 pills daily for 3 days  Tylenol (acetaminophen) 500 mg 2 pills 3 times daily as needed for pain.  Take Norco (hydrocodone with acetaminophen 5/325) 1 every 6 hours as needed for pain.  You can only have a maximum of 6 acetaminophen in 24 hours, so if you take the hydrocodone you should subtract 1 pill from the maximum number of Tylenol you are allowed.  Fluticasone nose spray 2 sprays each nostril twice daily for 3 days, then once daily  Augmentin (amoxicillin/clavulanate) 1 twice daily for infection  Your warfarin test 2 days ago showed your blood to be a little too thin at 3.8.  I would recommend holding the dose tomorrow, then resuming your regular dose Monday.  You will be seeing your doctor at that time and can discuss further with him.

## 2018-05-24 ENCOUNTER — Telehealth: Payer: Self-pay | Admitting: *Deleted

## 2018-05-24 NOTE — Telephone Encounter (Signed)
Patient called and stated she went to [facility] and was given Prednisone 20mg  and she took 3 tabs (60mg  total) for 3 days on 05/21/2018 for ear pain.  Also, she went to her PCP office Dr. Melford Aase on 05/23/2018 and her INR was checked & it was 3.4 on 05/23/2018. I was able to confirm this result via Care Everywhere. Her INR range is 2.5-3.5 so pt will continue normal dose and have some green leafy veggies today and continue normal dietary leafy intake.  She will keep regular appt that is already scheduled and call back if anything else is needed. She is also taking Augmentin and she is aware this medication is fine to take with Coumadin/Warfarin.

## 2018-06-07 ENCOUNTER — Ambulatory Visit (INDEPENDENT_AMBULATORY_CARE_PROVIDER_SITE_OTHER): Payer: Medicare HMO | Admitting: *Deleted

## 2018-06-07 DIAGNOSIS — I059 Rheumatic mitral valve disease, unspecified: Secondary | ICD-10-CM

## 2018-06-07 DIAGNOSIS — Z954 Presence of other heart-valve replacement: Secondary | ICD-10-CM | POA: Diagnosis not present

## 2018-06-07 DIAGNOSIS — Z5181 Encounter for therapeutic drug level monitoring: Secondary | ICD-10-CM | POA: Diagnosis not present

## 2018-06-07 LAB — POCT INR: INR: 4.9 — AB (ref 2.0–3.0)

## 2018-06-07 NOTE — Patient Instructions (Signed)
Description   Do not take any Coumadin today and No Coumadin tomorrow then start taking 1 tablet daily except 1.5 tablets on Sundays. Recheck INR in 2 weeks. Call with any questions new medications or procedures  [phone]

## 2018-06-21 ENCOUNTER — Other Ambulatory Visit: Payer: Self-pay

## 2018-06-21 ENCOUNTER — Ambulatory Visit (INDEPENDENT_AMBULATORY_CARE_PROVIDER_SITE_OTHER): Payer: Medicare HMO | Admitting: Pharmacist

## 2018-06-21 DIAGNOSIS — I059 Rheumatic mitral valve disease, unspecified: Secondary | ICD-10-CM

## 2018-06-21 DIAGNOSIS — Z954 Presence of other heart-valve replacement: Secondary | ICD-10-CM | POA: Diagnosis not present

## 2018-06-21 DIAGNOSIS — Z5181 Encounter for therapeutic drug level monitoring: Secondary | ICD-10-CM | POA: Diagnosis not present

## 2018-06-21 LAB — POCT INR: INR: 4.7 — AB (ref 2.0–3.0)

## 2018-06-21 NOTE — Patient Instructions (Signed)
Description   Skip your Coumadin today, take 1/2 tablet tomorrow, then start taking 1 tablet daily. Recheck INR in 2 weeks. Call with any questions new medications or procedures  [phone]

## 2018-07-06 ENCOUNTER — Telehealth: Payer: Self-pay

## 2018-07-06 NOTE — Telephone Encounter (Signed)

## 2018-07-07 ENCOUNTER — Ambulatory Visit (INDEPENDENT_AMBULATORY_CARE_PROVIDER_SITE_OTHER): Payer: Medicare HMO | Admitting: *Deleted

## 2018-07-07 ENCOUNTER — Other Ambulatory Visit: Payer: Self-pay

## 2018-07-07 DIAGNOSIS — Z5181 Encounter for therapeutic drug level monitoring: Secondary | ICD-10-CM

## 2018-07-07 DIAGNOSIS — Z954 Presence of other heart-valve replacement: Secondary | ICD-10-CM | POA: Diagnosis not present

## 2018-07-07 DIAGNOSIS — I059 Rheumatic mitral valve disease, unspecified: Secondary | ICD-10-CM | POA: Diagnosis not present

## 2018-07-07 LAB — POCT INR: INR: 3.2 — AB (ref 2.0–3.0)

## 2018-07-07 NOTE — Patient Instructions (Signed)
Description   Continue taking 1 tablet daily. Recheck INR in 4 weeks. Call with any questions new medications or procedures  [phone]

## 2018-08-02 ENCOUNTER — Telehealth: Payer: Self-pay

## 2018-08-02 NOTE — Telephone Encounter (Signed)

## 2018-08-02 NOTE — Telephone Encounter (Signed)
lmom for prescreen  

## 2018-08-03 ENCOUNTER — Ambulatory Visit (INDEPENDENT_AMBULATORY_CARE_PROVIDER_SITE_OTHER): Payer: Medicare HMO | Admitting: *Deleted

## 2018-08-03 ENCOUNTER — Other Ambulatory Visit: Payer: Self-pay

## 2018-08-03 DIAGNOSIS — I059 Rheumatic mitral valve disease, unspecified: Secondary | ICD-10-CM | POA: Diagnosis not present

## 2018-08-03 DIAGNOSIS — Z954 Presence of other heart-valve replacement: Secondary | ICD-10-CM

## 2018-08-03 DIAGNOSIS — Z5181 Encounter for therapeutic drug level monitoring: Secondary | ICD-10-CM | POA: Diagnosis not present

## 2018-08-03 LAB — POCT INR: INR: 4.4 — AB (ref 2.0–3.0)

## 2018-08-03 NOTE — Patient Instructions (Signed)
Description   Spoke with pt and instructed to hold today's dose then continue taking 1 tablet daily. Recheck INR in 3 weeks. Call with any questions new medications or procedures  [phone]

## 2018-08-10 ENCOUNTER — Other Ambulatory Visit: Payer: Self-pay | Admitting: Internal Medicine

## 2018-08-11 ENCOUNTER — Other Ambulatory Visit: Payer: Self-pay | Admitting: Cardiology

## 2018-08-11 NOTE — Telephone Encounter (Signed)
Please review for refill.  

## 2018-08-23 ENCOUNTER — Telehealth: Payer: Self-pay

## 2018-08-23 NOTE — Telephone Encounter (Signed)

## 2018-08-23 NOTE — Telephone Encounter (Signed)
lmom for prescreen  

## 2018-08-24 ENCOUNTER — Other Ambulatory Visit: Payer: Self-pay

## 2018-08-24 ENCOUNTER — Ambulatory Visit (INDEPENDENT_AMBULATORY_CARE_PROVIDER_SITE_OTHER): Payer: Medicare HMO | Admitting: *Deleted

## 2018-08-24 DIAGNOSIS — I059 Rheumatic mitral valve disease, unspecified: Secondary | ICD-10-CM | POA: Diagnosis not present

## 2018-08-24 DIAGNOSIS — Z954 Presence of other heart-valve replacement: Secondary | ICD-10-CM | POA: Diagnosis not present

## 2018-08-24 DIAGNOSIS — Z5181 Encounter for therapeutic drug level monitoring: Secondary | ICD-10-CM | POA: Diagnosis not present

## 2018-08-24 LAB — POCT INR: INR: 3.6 — AB (ref 2.0–3.0)

## 2018-08-24 NOTE — Patient Instructions (Signed)
Description   Spoke with pt and instructed her to start taking 1 tablet daily except for Wednesdays take half a tablet. Recheck INR in 3 weeks. Call with any questions new medications or procedures  [phone]

## 2018-09-07 ENCOUNTER — Telehealth: Payer: Self-pay

## 2018-09-07 NOTE — Telephone Encounter (Signed)

## 2018-09-14 ENCOUNTER — Other Ambulatory Visit: Payer: Self-pay

## 2018-09-14 ENCOUNTER — Ambulatory Visit (INDEPENDENT_AMBULATORY_CARE_PROVIDER_SITE_OTHER): Payer: Medicare HMO | Admitting: *Deleted

## 2018-09-14 DIAGNOSIS — Z954 Presence of other heart-valve replacement: Secondary | ICD-10-CM

## 2018-09-14 DIAGNOSIS — Z5181 Encounter for therapeutic drug level monitoring: Secondary | ICD-10-CM

## 2018-09-14 DIAGNOSIS — I059 Rheumatic mitral valve disease, unspecified: Secondary | ICD-10-CM

## 2018-09-14 LAB — POCT INR: INR: 2.8 (ref 2.0–3.0)

## 2018-09-14 NOTE — Patient Instructions (Signed)
Description   Continue taking 1 tablet daily except for Wednesdays take half a tablet. Recheck INR in 4 weeks. Call with any questions new medications or procedures  [phone]

## 2018-10-05 ENCOUNTER — Telehealth: Payer: Self-pay

## 2018-10-05 NOTE — Telephone Encounter (Signed)

## 2018-10-12 ENCOUNTER — Ambulatory Visit (INDEPENDENT_AMBULATORY_CARE_PROVIDER_SITE_OTHER): Payer: Medicare HMO | Admitting: Pharmacist

## 2018-10-12 ENCOUNTER — Other Ambulatory Visit: Payer: Self-pay

## 2018-10-12 DIAGNOSIS — Z954 Presence of other heart-valve replacement: Secondary | ICD-10-CM | POA: Diagnosis not present

## 2018-10-12 DIAGNOSIS — I059 Rheumatic mitral valve disease, unspecified: Secondary | ICD-10-CM

## 2018-10-12 DIAGNOSIS — Z5181 Encounter for therapeutic drug level monitoring: Secondary | ICD-10-CM | POA: Diagnosis not present

## 2018-10-12 LAB — POCT INR: INR: 3 (ref 2.0–3.0)

## 2018-10-12 NOTE — Patient Instructions (Signed)
Description   Continue taking 1 tablet daily except for 1/2 tablet on Wednesdays. Recheck INR in 6 weeks. Call with any questions new medications or procedures  [phone]

## 2018-11-23 ENCOUNTER — Ambulatory Visit (INDEPENDENT_AMBULATORY_CARE_PROVIDER_SITE_OTHER): Payer: Medicare HMO | Admitting: *Deleted

## 2018-11-23 ENCOUNTER — Other Ambulatory Visit: Payer: Self-pay

## 2018-11-23 DIAGNOSIS — Z954 Presence of other heart-valve replacement: Secondary | ICD-10-CM | POA: Diagnosis not present

## 2018-11-23 DIAGNOSIS — I059 Rheumatic mitral valve disease, unspecified: Secondary | ICD-10-CM

## 2018-11-23 DIAGNOSIS — Z5181 Encounter for therapeutic drug level monitoring: Secondary | ICD-10-CM | POA: Diagnosis not present

## 2018-11-23 LAB — POCT INR: INR: 2.7 (ref 2.0–3.0)

## 2018-11-23 NOTE — Patient Instructions (Signed)
Description   Continue taking 1 tablet daily except for 1/2 tablet on Wednesdays. Recheck INR in 8 weeks. Call with any questions new medications or procedures  [phone]

## 2019-01-18 ENCOUNTER — Ambulatory Visit (INDEPENDENT_AMBULATORY_CARE_PROVIDER_SITE_OTHER): Payer: Medicare HMO | Admitting: Pharmacist

## 2019-01-18 ENCOUNTER — Other Ambulatory Visit: Payer: Self-pay

## 2019-01-18 DIAGNOSIS — I059 Rheumatic mitral valve disease, unspecified: Secondary | ICD-10-CM

## 2019-01-18 DIAGNOSIS — Z954 Presence of other heart-valve replacement: Secondary | ICD-10-CM

## 2019-01-18 DIAGNOSIS — Z5181 Encounter for therapeutic drug level monitoring: Secondary | ICD-10-CM | POA: Diagnosis not present

## 2019-01-18 LAB — POCT INR: INR: 2.7 (ref 2.0–3.0)

## 2019-01-18 NOTE — Patient Instructions (Signed)
Description   Continue taking 1 tablet daily except for 1/2 tablet on Wednesdays. Recheck INR in 8 weeks. Call with any questions new medications or procedures  [phone]      

## 2019-01-31 ENCOUNTER — Encounter: Payer: Self-pay | Admitting: Pharmacist

## 2019-02-17 ENCOUNTER — Ambulatory Visit: Payer: Medicare HMO | Admitting: Internal Medicine

## 2019-03-10 ENCOUNTER — Other Ambulatory Visit: Payer: Self-pay | Admitting: Internal Medicine

## 2019-03-10 NOTE — Telephone Encounter (Signed)
Please review for refill. Thank you! 

## 2019-03-11 NOTE — Progress Notes (Signed)
Cardiology Office Note   Date:  03/13/2019   ID:  Priscilla Stevens, DOB 24-May-1950, MRN [id number]  PCP:  Chesley Noon, MD  Cardiologist:   Dorris Carnes, MD    Follow-up of mitral valve disease.   History of Present Illness: Priscilla Stevens is a 68 y.o. female with a history ofrheumatic mitral valve disease s/p mitral valve replacement with mechanical MVR in 2/70, COPD, diastoliccongestive heart failure, GERD. She did not have significant CAD on cath prior to MVR.Of note she is not able to take ASA due to GI side effects.Echocardiogram in November 2018 demonstrated normal LV function and normally functioning mechanical mitral valve prosthesis.   She was last seen inFeb 2020 by Kathleen Argue    Since seen the patient says her breathing is okay.  She denies palpitations.  No chest pressure.  No dizziness.  Echo 03/02/17 EF 60-65, no RWMA, mechanical MVR functioning normally (no MR, no MS; mean 5), mild LAE, normal RVSF  PFTs 7/17 FEV1 45% predicted; FEV1/FVC 67%, uncorr DLCO 61% predicted >>Severe obstructive airways disease suggestive of emphysema.     Current Meds  Medication Sig  . albuterol (PROVENTIL HFA;VENTOLIN HFA) 108 (90 BASE) MCG/ACT inhaler Inhale 2 puffs into the lungs every 6 (six) hours as needed for wheezing or shortness of breath.  . ALPRAZolam (XANAX) 0.25 MG tablet Take 1 tablet (0.25 mg total) by mouth at bedtime as needed for sleep or anxiety.  Marland Kitchen amoxicillin (AMOXIL) 500 MG tablet 4 tablets (2 G) 30-60 minutes prior to dental appointment.  . calcium carbonate (OS-CAL) 600 MG TABS Take 600 mg by mouth 2 (two) times daily with a meal.   . fluticasone (FLONASE) 50 MCG/ACT nasal spray Place 2 sprays into both nostrils daily.  . furosemide (LASIX) 20 MG tablet TAKE 1 TABLET (20 MG TOTAL) BY MOUTH DAILY.  Marland Kitchen HYDROcodone-acetaminophen (NORCO) 5-325 MG tablet Take 1 tablet by mouth every 6 (six) hours as needed.  . metoprolol succinate (TOPROL-XL) 25 MG  24 hr tablet TAKE 1 TABLET TWICE DAILY   (NEED MD APPOINTMENT FOR REFILLS)  . warfarin (COUMADIN) 5 MG tablet Take 1 tablet daily except 1/2 tablet on Wednesday or as directed by Anticoagulation Clinic.     Allergies:   Statins and Aspirin   Past Medical History:  Diagnosis Date  . Acute on chronic diastolic heart failure ([place]) 06/22/2012  . Anxiety   . CHF (congestive heart failure) ([place])   . Chronic diastolic congestive heart failure ([place]) 07/07/2012  . CLL (chronic lymphocytic leukemia) ([place])   . Complication of anesthesia   . Cough    Eval by pulm in past: essentially normal PFTs, cough ddx was variant asthma, upper airway cough syndrome (previously termed post nasal drip syndrome) or GERD,   . Diverticulitis   . Dysrhythmia    FAST HB TAKING METOPROLOL NOT FOR HTN  . Fibromyalgia   . GERD (gastroesophageal reflux disease)   . Headache(784.0)    HX MIGRAINES   . Heart murmur   . History of echocardiogram    Echo 9/16:  Vigorous LVF, EF 65-70%, MVR ok (peak 12 mmHg, mean 6 mmHg), mild LAE, normal RVF.  Marland Kitchen History of tobacco abuse 06/22/2012  . Mitral stenosis 06/20/2012  . Morbid obesity ([place]) 06/20/2012  . Pneumonia    2-3 YRS AGO   . PONV (postoperative nausea and vomiting)   . S/P mitral valve replacement with metallic valve 09/05/3760   29 mm Sorin Carbomedics mechanical valve  .  Severe mitral regurgitation 06/20/2012    Past Surgical History:  Procedure Laterality Date  . APPENDECTOMY    . INTRAOPERATIVE TRANSESOPHAGEAL ECHOCARDIOGRAM N/A 07/07/2012   Procedure: INTRAOPERATIVE TRANSESOPHAGEAL ECHOCARDIOGRAM;  Surgeon: Rexene Alberts, MD;  Location: [facility];  Service: Open Heart Surgery;  Laterality: N/A;  . LEFT AND RIGHT HEART CATHETERIZATION WITH CORONARY ANGIOGRAM N/A 06/21/2012   Procedure: LEFT AND RIGHT HEART CATHETERIZATION WITH CORONARY ANGIOGRAM;  Surgeon: Larey Dresser, MD;  Location[facility] C[facility] LAB;  Service: Cardiovascular;  Laterality: N/A;  . MITRAL VALVE  REPLACEMENT N/A 07/07/2012   Procedure: MITRAL VALVE (MV) REPLACEMENT;  Surgeon: Rexene Alberts, MD;  Location: [facility];  Service: Open Heart Surgery;  Laterality: N/A;  . NO PAST SURGERIES     TUBAL PREG   . TEE WITHOUT CARDIOVERSION N/A 06/22/2012   Procedure: TRANSESOPHAGEAL ECHOCARDIOGRAM (TEE);  Surgeon: Larey Dresser, MD;  Location: [place];  Service: Cardiovascular;  Laterality: N/A;  . TONSILLECTOMY    . TOTAL VAGINAL HYSTERECTOMY       Social History:  The patient  reports that she quit smoking about 5 years ago. She has a 36.00 pack-year smoking history. She has never used smokeless tobacco. She reports current alcohol use. She reports that she does not use drugs.   Family History:  The patient's family history includes Cancer - Lung in her father; Congestive Heart Failure in her mother.    ROS:  Please see the history of present illness. All other systems are reviewed and  Negative to the above problem except as noted.    PHYSICAL EXAM: VS:  BP 132/66   Pulse 73   Ht 5' 5"  (1.651 m)   Wt 188 lb 12.8 oz (85.6 kg)   SpO2 99%   BMI 31.42 kg/m   GEN: Well nourished, well developed, in no acute distress  HEENT: normal  Neck: no JVD, carotid bruits,  Cardiac: RRR; no murmurs.  Crisp valve sounds.,no edema  Respiratory:  clear to auscultation bilaterally, normal work of breathing GI: soft, nontender, nondistended, + BS  No hepatomegaly  MS: no deformity Moving all extremities   Skin: warm and dry, no rash Neuro:  Strength and sensation are intact Psych: euthymic mood, full affect   EKG:  EKG is not ordered today.   Lipid Panel    Component Value Date/Time   CHOL 159 07/08/2015 1510   TRIG 214 (H) 07/08/2015 1510   HDL 52 07/08/2015 1510   CHOLHDL 3.1 07/08/2015 1510   VLDL 43 (H) 07/08/2015 1510   LDLCALC 64 07/08/2015 1510      Wt Readings from Last 3 Encounters:  03/13/19 188 lb 12.8 oz (85.6 kg)  05/21/18 197 lb (89.4 kg)  05/10/18 196 lb 1.9 oz (89  kg)      ASSESSMENT AND PLAN: 1.  Mitral valve disease patient is status post MVR.  Valve sounds are good.  I would not plan any further testing for now.    Healthcare maintenance.  We will check lipid panel.  Will check CBC, c-Met and TSH.  Follow-up 9 months.  Current medicines are reviewed at length with the patient today.  The patient does not have concerns regarding medicines.  Signed, Dorris Carnes, MD  03/13/2019 12:03 PM    [address], [place], [place]  [address] Phone: [phone]; Fax: [phone]

## 2019-03-13 ENCOUNTER — Ambulatory Visit (INDEPENDENT_AMBULATORY_CARE_PROVIDER_SITE_OTHER): Payer: Medicare HMO | Admitting: Pharmacist

## 2019-03-13 ENCOUNTER — Encounter: Payer: Self-pay | Admitting: Internal Medicine

## 2019-03-13 ENCOUNTER — Other Ambulatory Visit: Payer: Self-pay

## 2019-03-13 ENCOUNTER — Ambulatory Visit: Payer: Medicare HMO | Admitting: Internal Medicine

## 2019-03-13 VITALS — BP 132/66 | HR 73 | Ht 65.0 in | Wt 188.8 lb

## 2019-03-13 DIAGNOSIS — Z954 Presence of other heart-valve replacement: Secondary | ICD-10-CM | POA: Diagnosis not present

## 2019-03-13 DIAGNOSIS — I5032 Chronic diastolic (congestive) heart failure: Secondary | ICD-10-CM

## 2019-03-13 DIAGNOSIS — I059 Rheumatic mitral valve disease, unspecified: Secondary | ICD-10-CM

## 2019-03-13 DIAGNOSIS — Z5181 Encounter for therapeutic drug level monitoring: Secondary | ICD-10-CM

## 2019-03-13 DIAGNOSIS — E785 Hyperlipidemia, unspecified: Secondary | ICD-10-CM

## 2019-03-13 LAB — POCT INR: INR: 2.3 (ref 2.0–3.0)

## 2019-03-13 NOTE — Patient Instructions (Signed)
Medication Instructions:  No changes *If you need a refill on your cardiac medications before your next appointment, please call your pharmacy*  Lab Work: Today: cbc, cmet, lipids, tsh If you have labs (blood work) drawn today and your tests are completely normal, you will receive your results only by: Marland Kitchen MyChart Message (if you have MyChart) OR . A paper copy in the mail If you have any lab test that is abnormal or we need to change your treatment, we will call you to review the results.  Testing/Procedures: none  Follow-Up: At [facility], you and your health needs are our priority.  As part of our continuing mission to provide you with exceptional heart care, we have created designated Provider Care Teams.  These Care Teams include your primary Cardiologist (physician) and Advanced Practice Providers (APPs -  Physician Assistants and Nurse Practitioners) who all work together to provide you with the care you need, when you need it.  Your next appointment:   9 month(s)  The format for your next appointment:   In Person  Provider:   You may see Dorris Carnes, MD or one of the following Advanced Practice Providers on your designated Care Team:    Richardson Dopp, PA-C  [place], [place]  Daune Perch, NP   Other Instructions

## 2019-03-13 NOTE — Patient Instructions (Signed)
Description   Take 1.5 tablets today, then continue taking 1 tablet daily except for 1/2 tablet on Wednesdays. Recheck INR in 3 weeks. Call with any questions new medications or procedures  [phone]

## 2019-03-14 LAB — COMPREHENSIVE METABOLIC PANEL
ALT: 10 IU/L (ref 0–32)
AST: 19 IU/L (ref 0–40)
Albumin/Globulin Ratio: 2 (ref 1.2–2.2)
Albumin: 4.3 g/dL (ref 3.8–4.8)
Alkaline Phosphatase: 85 IU/L (ref 39–117)
BUN/Creatinine Ratio: 10 — ABNORMAL LOW (ref 12–28)
BUN: 9 mg/dL (ref 8–27)
Bilirubin Total: 0.2 mg/dL (ref 0.0–1.2)
CO2: 24 mmol/L (ref 20–29)
Calcium: 9.6 mg/dL (ref 8.7–10.3)
Chloride: 103 mmol/L (ref 96–106)
Creatinine, Ser: 0.87 mg/dL (ref 0.57–1.00)
GFR calc Af Amer: 80 mL/min/{1.73_m2} (ref >59)
GFR calc non Af Amer: 70 mL/min/{1.73_m2} (ref >59)
Globulin, Total: 2.1 g/dL (ref 1.5–4.5)
Glucose: 109 mg/dL — ABNORMAL HIGH (ref 65–99)
Potassium: 4.4 mmol/L (ref 3.5–5.2)
Sodium: 141 mmol/L (ref 134–144)
Total Protein: 6.4 g/dL (ref 6.0–8.5)

## 2019-03-14 LAB — CBC
Hematocrit: 43.7 % (ref 34.0–46.6)
Hemoglobin: 14.5 g/dL (ref 11.1–15.9)
MCH: 29 pg (ref 26.6–33.0)
MCHC: 33.2 g/dL (ref 31.5–35.7)
MCV: 87 fL (ref 79–97)
Platelets: 214 10*3/uL (ref 150–450)
RBC: 5 x10E6/uL (ref 3.77–5.28)
RDW: 13 % (ref 11.7–15.4)
WBC: 11.5 10*3/uL — ABNORMAL HIGH (ref 3.4–10.8)

## 2019-03-14 LAB — LIPID PANEL
Chol/HDL Ratio: 6.1 ratio — ABNORMAL HIGH (ref 0.0–4.4)
Cholesterol, Total: 212 mg/dL — ABNORMAL HIGH (ref 100–199)
HDL: 35 mg/dL — ABNORMAL LOW (ref >39)
LDL Chol Calc (NIH): 117 mg/dL — ABNORMAL HIGH (ref 0–99)
Triglycerides: 343 mg/dL — ABNORMAL HIGH (ref 0–149)
VLDL Cholesterol Cal: 60 mg/dL — ABNORMAL HIGH (ref 5–40)

## 2019-03-14 LAB — TSH: TSH: 1.09 u[IU]/mL (ref 0.450–4.500)

## 2019-03-17 ENCOUNTER — Other Ambulatory Visit: Payer: Self-pay | Admitting: *Deleted

## 2019-03-17 DIAGNOSIS — E785 Hyperlipidemia, unspecified: Secondary | ICD-10-CM

## 2019-04-06 ENCOUNTER — Other Ambulatory Visit: Payer: Self-pay

## 2019-04-06 ENCOUNTER — Ambulatory Visit (INDEPENDENT_AMBULATORY_CARE_PROVIDER_SITE_OTHER): Payer: Medicare HMO | Admitting: *Deleted

## 2019-04-06 DIAGNOSIS — Z954 Presence of other heart-valve replacement: Secondary | ICD-10-CM | POA: Diagnosis not present

## 2019-04-06 DIAGNOSIS — I059 Rheumatic mitral valve disease, unspecified: Secondary | ICD-10-CM

## 2019-04-06 DIAGNOSIS — Z5181 Encounter for therapeutic drug level monitoring: Secondary | ICD-10-CM | POA: Diagnosis not present

## 2019-04-06 LAB — POCT INR: INR: 2.6 (ref 2.0–3.0)

## 2019-04-06 NOTE — Patient Instructions (Signed)
Description   Continue taking 1 tablet daily except for 1/2 tablet on Wednesdays. Recheck INR in 4 weeks. Call with any questions new medications or procedures  [phone]

## 2019-05-01 ENCOUNTER — Ambulatory Visit: Payer: Medicare HMO | Admitting: Internal Medicine

## 2019-05-04 ENCOUNTER — Ambulatory Visit (INDEPENDENT_AMBULATORY_CARE_PROVIDER_SITE_OTHER): Payer: Medicare HMO | Admitting: *Deleted

## 2019-05-04 ENCOUNTER — Other Ambulatory Visit: Payer: Self-pay

## 2019-05-04 DIAGNOSIS — Z954 Presence of other heart-valve replacement: Secondary | ICD-10-CM

## 2019-05-04 DIAGNOSIS — Z5181 Encounter for therapeutic drug level monitoring: Secondary | ICD-10-CM | POA: Diagnosis not present

## 2019-05-04 DIAGNOSIS — I059 Rheumatic mitral valve disease, unspecified: Secondary | ICD-10-CM

## 2019-05-04 LAB — POCT INR: INR: 1.9 — AB (ref 2.0–3.0)

## 2019-05-04 NOTE — Patient Instructions (Signed)
Description   Take 1.5 tablets today and tomorrow, then continue taking 1 tablet daily except for 1/2 tablet on Wednesdays. Recheck INR in 2-3 weeks. Call with any questions new medications or procedures  [phone]

## 2019-05-22 ENCOUNTER — Other Ambulatory Visit: Payer: Self-pay | Admitting: Internal Medicine

## 2019-05-23 ENCOUNTER — Other Ambulatory Visit: Payer: Self-pay

## 2019-05-23 ENCOUNTER — Ambulatory Visit (INDEPENDENT_AMBULATORY_CARE_PROVIDER_SITE_OTHER): Payer: Medicare HMO | Admitting: *Deleted

## 2019-05-23 DIAGNOSIS — Z5181 Encounter for therapeutic drug level monitoring: Secondary | ICD-10-CM | POA: Diagnosis not present

## 2019-05-23 DIAGNOSIS — Z954 Presence of other heart-valve replacement: Secondary | ICD-10-CM

## 2019-05-23 DIAGNOSIS — I059 Rheumatic mitral valve disease, unspecified: Secondary | ICD-10-CM

## 2019-05-23 LAB — POCT INR: INR: 2.1 (ref 2.0–3.0)

## 2019-05-23 NOTE — Patient Instructions (Signed)
Description   Take 1.5 tablets today then start take 1 tablet daily. Recheck INR in 2 weeks. Call with any questions new medications or procedures  [phone]

## 2019-05-23 NOTE — Telephone Encounter (Signed)
Refill Request.  

## 2019-06-06 ENCOUNTER — Other Ambulatory Visit: Payer: Self-pay

## 2019-06-06 ENCOUNTER — Ambulatory Visit (INDEPENDENT_AMBULATORY_CARE_PROVIDER_SITE_OTHER): Payer: Medicare HMO | Admitting: *Deleted

## 2019-06-06 DIAGNOSIS — Z5181 Encounter for therapeutic drug level monitoring: Secondary | ICD-10-CM

## 2019-06-06 DIAGNOSIS — Z954 Presence of other heart-valve replacement: Secondary | ICD-10-CM

## 2019-06-06 DIAGNOSIS — I059 Rheumatic mitral valve disease, unspecified: Secondary | ICD-10-CM

## 2019-06-06 LAB — POCT INR: INR: 2.8 (ref 2.0–3.0)

## 2019-06-06 NOTE — Patient Instructions (Signed)
Description   Continue to  take 1 tablet daily. Recheck INR in 3 weeks. Call with any questions new medications or procedures  [phone]

## 2019-06-27 ENCOUNTER — Ambulatory Visit (INDEPENDENT_AMBULATORY_CARE_PROVIDER_SITE_OTHER): Payer: Medicare HMO | Admitting: *Deleted

## 2019-06-27 ENCOUNTER — Other Ambulatory Visit: Payer: Self-pay

## 2019-06-27 DIAGNOSIS — Z954 Presence of other heart-valve replacement: Secondary | ICD-10-CM | POA: Diagnosis not present

## 2019-06-27 DIAGNOSIS — Z5181 Encounter for therapeutic drug level monitoring: Secondary | ICD-10-CM

## 2019-06-27 DIAGNOSIS — I059 Rheumatic mitral valve disease, unspecified: Secondary | ICD-10-CM

## 2019-06-27 LAB — POCT INR: INR: 2.6 (ref 2.0–3.0)

## 2019-06-27 NOTE — Patient Instructions (Signed)
Description   Continue taking 1 tablet daily. Recheck INR in 5 weeks. Call with any questions new medications or procedures  [phone]

## 2019-08-01 ENCOUNTER — Ambulatory Visit (INDEPENDENT_AMBULATORY_CARE_PROVIDER_SITE_OTHER): Payer: Medicare HMO | Admitting: *Deleted

## 2019-08-01 ENCOUNTER — Other Ambulatory Visit: Payer: Self-pay

## 2019-08-01 DIAGNOSIS — Z5181 Encounter for therapeutic drug level monitoring: Secondary | ICD-10-CM | POA: Diagnosis not present

## 2019-08-01 DIAGNOSIS — Z954 Presence of other heart-valve replacement: Secondary | ICD-10-CM

## 2019-08-01 DIAGNOSIS — I059 Rheumatic mitral valve disease, unspecified: Secondary | ICD-10-CM

## 2019-08-01 LAB — POCT INR: INR: 3.7 — AB (ref 2.0–3.0)

## 2019-08-01 NOTE — Patient Instructions (Addendum)
Description   Today take 1/2 tablet then continue taking 1 tablet daily. Recheck INR in 4 weeks. Call with any questions new medications or procedures  [phone]

## 2019-08-10 ENCOUNTER — Ambulatory Visit (INDEPENDENT_AMBULATORY_CARE_PROVIDER_SITE_OTHER): Payer: Medicare HMO | Admitting: *Deleted

## 2019-08-10 DIAGNOSIS — Z954 Presence of other heart-valve replacement: Secondary | ICD-10-CM

## 2019-08-10 DIAGNOSIS — Z5181 Encounter for therapeutic drug level monitoring: Secondary | ICD-10-CM | POA: Diagnosis not present

## 2019-08-10 DIAGNOSIS — I059 Rheumatic mitral valve disease, unspecified: Secondary | ICD-10-CM

## 2019-08-10 LAB — POCT INR: INR: 3.5 — AB (ref 2.0–3.0)

## 2019-09-05 ENCOUNTER — Ambulatory Visit (INDEPENDENT_AMBULATORY_CARE_PROVIDER_SITE_OTHER): Payer: Medicare HMO | Admitting: *Deleted

## 2019-09-05 ENCOUNTER — Other Ambulatory Visit: Payer: Self-pay

## 2019-09-05 DIAGNOSIS — Z5181 Encounter for therapeutic drug level monitoring: Secondary | ICD-10-CM

## 2019-09-05 DIAGNOSIS — Z954 Presence of other heart-valve replacement: Secondary | ICD-10-CM | POA: Diagnosis not present

## 2019-09-05 DIAGNOSIS — I059 Rheumatic mitral valve disease, unspecified: Secondary | ICD-10-CM

## 2019-09-05 LAB — POCT INR: INR: 3 (ref 2.0–3.0)

## 2019-09-05 NOTE — Patient Instructions (Signed)
Description   Continue taking 1 tablet daily. Recheck INR in 5 weeks. Call with any questions new medications or procedures  [phone]

## 2019-09-20 ENCOUNTER — Telehealth: Payer: Self-pay | Admitting: *Deleted

## 2019-09-20 NOTE — Telephone Encounter (Signed)
Placed in outgoing mail

## 2019-09-20 NOTE — Telephone Encounter (Signed)
Disability parking placard form from patient.  Signed by Dr. Harrington Challenger at this time. Message left on mobile number that form has been completed and to let me know if it should be mailed to her or if she will pick up at the office.

## 2019-09-20 NOTE — Telephone Encounter (Signed)
Patient would like it mailed. Confirmed address that is listed on MyChart.

## 2019-10-10 ENCOUNTER — Ambulatory Visit (INDEPENDENT_AMBULATORY_CARE_PROVIDER_SITE_OTHER): Payer: Medicare HMO | Admitting: *Deleted

## 2019-10-10 ENCOUNTER — Other Ambulatory Visit: Payer: Self-pay

## 2019-10-10 DIAGNOSIS — Z954 Presence of other heart-valve replacement: Secondary | ICD-10-CM

## 2019-10-10 DIAGNOSIS — I059 Rheumatic mitral valve disease, unspecified: Secondary | ICD-10-CM | POA: Diagnosis not present

## 2019-10-10 DIAGNOSIS — Z5181 Encounter for therapeutic drug level monitoring: Secondary | ICD-10-CM

## 2019-10-10 LAB — POCT INR: INR: 3.3 — AB (ref 2.0–3.0)

## 2019-10-10 NOTE — Patient Instructions (Signed)
Description   Continue taking 1 tablet daily. Recheck INR in 6 weeks. Call with any questions new medications or procedures  [phone]

## 2019-10-31 ENCOUNTER — Telehealth: Payer: Self-pay

## 2019-10-31 NOTE — Telephone Encounter (Signed)
Pt called into clinic to report PCP starting pt on Prednisone taper for vertigo. Will be taking Prednisone 20mg  tablets, 3 tabs x 1 day, 2 tabs x 2 days, 1 tab x 2 days, 1/2 tab x 2 days then off.  # 10 tablets rx for 7 day taper.  Advised pt Prednisone can interact with Warfarin, made pt appt for Friday 11/03/19 to check INR.

## 2019-11-03 ENCOUNTER — Other Ambulatory Visit: Payer: Self-pay

## 2019-11-03 ENCOUNTER — Ambulatory Visit (INDEPENDENT_AMBULATORY_CARE_PROVIDER_SITE_OTHER): Payer: Medicare HMO

## 2019-11-03 DIAGNOSIS — I059 Rheumatic mitral valve disease, unspecified: Secondary | ICD-10-CM

## 2019-11-03 DIAGNOSIS — Z954 Presence of other heart-valve replacement: Secondary | ICD-10-CM

## 2019-11-03 DIAGNOSIS — Z5181 Encounter for therapeutic drug level monitoring: Secondary | ICD-10-CM | POA: Diagnosis not present

## 2019-11-03 LAB — POCT INR: INR: 2.8 (ref 2.0–3.0)

## 2019-11-03 NOTE — Patient Instructions (Signed)
Continue taking 1 tablet daily. Recheck INR in 6 weeks. Call with any questions new medications or procedures  [phone]

## 2019-12-04 ENCOUNTER — Other Ambulatory Visit: Payer: Self-pay | Admitting: Internal Medicine

## 2019-12-13 ENCOUNTER — Other Ambulatory Visit: Payer: Self-pay

## 2019-12-13 ENCOUNTER — Ambulatory Visit (INDEPENDENT_AMBULATORY_CARE_PROVIDER_SITE_OTHER): Payer: Medicare HMO | Admitting: *Deleted

## 2019-12-13 DIAGNOSIS — Z954 Presence of other heart-valve replacement: Secondary | ICD-10-CM | POA: Diagnosis not present

## 2019-12-13 DIAGNOSIS — Z5181 Encounter for therapeutic drug level monitoring: Secondary | ICD-10-CM | POA: Diagnosis not present

## 2019-12-13 DIAGNOSIS — I059 Rheumatic mitral valve disease, unspecified: Secondary | ICD-10-CM

## 2019-12-13 LAB — POCT INR: INR: 3.7 — AB (ref 2.0–3.0)

## 2019-12-13 NOTE — Patient Instructions (Signed)
Description   Today take 1/2 tablet then continue taking 1 tablet daily. Recheck INR in 6 weeks. Call with any questions new medications or procedures  [phone]

## 2019-12-23 NOTE — Progress Notes (Signed)
Cardiology Office Note   Date:  12/25/2019   ID:  Priscilla Stevens, DOB [birth year], MRN [id number]  PCP:  Chesley Noon, MD  Cardiologist:   Dorris Carnes, MD    Follow-up of mitral valve Stevens.   History of Present Illness: Priscilla Stevens is a 69 y.o. 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  Pt also has Hx of  COPD, diastoliccongestive heart failure, GERD. She did not have significant CAD on cath prior to MVR.Of note she is not able to take ASA due to GI side effects.Echocardiogram in November 2018 demonstrated normal LV function and normally functioning mechanical mitral valve prosthesis.    Echo 03/02/17 EF 60-65, no RWMA, mechanical MVR functioning normally (no MR, no MS; mean 5), mild LAE, normal RVSF  PFTs 7/17 FEV1 45% predicted; FEV1/FVC 67%, uncorr DLCO 61% predicted >>Severe obstructive airways Stevens suggestive of emphysema.  The pt was last in clinic in Dec 2020   Pt denies CP her breathing is okay.  She does have some allergies now.  She denies dizziness.  No palpitations Current Meds  Medication Sig  . albuterol (PROVENTIL HFA;VENTOLIN HFA) 108 (90 BASE) MCG/ACT inhaler Inhale 2 puffs into the lungs every 6 (six) hours as needed for wheezing or shortness of breath.  . ALPRAZolam (XANAX) 0.25 MG tablet Take 1 tablet (0.25 mg total) by mouth at bedtime as needed for sleep or anxiety.  Marland Kitchen amoxicillin (AMOXIL) 500 MG tablet 4 tablets (2 G) 30-60 minutes prior to dental appointment.  . calcium carbonate (OS-CAL) 600 MG TABS Take 600 mg by mouth 2 (two) times daily with a meal.   . ezetimibe (ZETIA) 10 MG tablet Take 10 mg by mouth daily.  . metoprolol succinate (TOPROL-XL) 25 MG 24 hr tablet Take 1 tablet (25 mg total) by mouth 2 (two) times daily.  Marland Kitchen warfarin (COUMADIN) 5 MG tablet TAKE 1 TABLET EVERY DAY EXCEPT 1/2 TABLET ON WEDNESDAY OR AS DIRECTED BY ANTICOAGULATION CLINIC      Allergies:   Statins and Aspirin   Past Medical History:  Diagnosis Date  . Acute on chronic diastolic heart failure ([place]) 06/22/2012  . Anxiety   . CHF (congestive heart failure) ([place])   . Chronic diastolic congestive heart failure ([place]) 07/07/2012  . CLL (chronic lymphocytic leukemia) ([place])   . Complication of anesthesia   . Cough    Eval by pulm in past: essentially normal PFTs, cough ddx was variant asthma, upper airway cough syndrome (previously termed post nasal drip syndrome) or GERD,   . Diverticulitis   . Dysrhythmia    FAST HB TAKING METOPROLOL NOT FOR HTN  . Fibromyalgia   . GERD (gastroesophageal reflux Stevens)   . Headache(784.0)    HX MIGRAINES   . Heart murmur   . History of echocardiogram    Echo 9/16:  Vigorous LVF, EF 65-70%, MVR ok (peak 12 mmHg, mean 6 mmHg), mild LAE, normal RVF.  Marland Kitchen History of tobacco abuse 06/22/2012  . Mitral stenosis 06/20/2012  . Morbid obesity ([place]) 06/20/2012  . Pneumonia    2-3 YRS AGO   . PONV (postoperative nausea and vomiting)   . S/P mitral valve replacement with metallic valve 0/12/3265   29 mm Sorin Carbomedics mechanical valve  . Severe mitral regurgitation 06/20/2012    Past Surgical History:  Procedure Laterality Date  . APPENDECTOMY    . INTRAOPERATIVE TRANSESOPHAGEAL ECHOCARDIOGRAM N/A 07/07/2012   Procedure:  INTRAOPERATIVE TRANSESOPHAGEAL ECHOCARDIOGRAM;  Surgeon: Rexene Alberts, MD;  Location: [facility];  Service: Open Heart Surgery;  Laterality: N/A;  . LEFT AND RIGHT HEART CATHETERIZATION WITH CORONARY ANGIOGRAM N/A 06/21/2012   Procedure: LEFT AND RIGHT HEART CATHETERIZATION WITH CORONARY ANGIOGRAM;  Surgeon: Larey Dresser, MD;  Location: [facility] CATH LAB;  Service: Cardiovascular;  Laterality: N/A;  . MITRAL VALVE REPLACEMENT N/A 07/07/2012   Procedure: MITRAL VALVE (MV) REPLACEMENT;  Surgeon: Rexene Alberts, MD;  Location: [facility];  Service: Open Heart Surgery;  Laterality: N/A;  . NO PAST SURGERIES     TUBAL PREG   . TEE  WITHOUT CARDIOVERSION N/A 06/22/2012   Procedure: TRANSESOPHAGEAL ECHOCARDIOGRAM (TEE);  Surgeon: Larey Dresser, MD;  Location: [place];  Service: Cardiovascular;  Laterality: N/A;  . TONSILLECTOMY    . TOTAL VAGINAL HYSTERECTOMY       Social History:  The patient  reports that she quit smoking about 6 years ago. She has a 36.00 pack-year smoking history. She has never used smokeless tobacco. She reports current alcohol use. She reports that she does not use drugs.   Family History:  The patient's family history includes Cancer - Lung in her father; Congestive Heart Failure in her mother.    ROS:  Please see the history of present illness. All other systems are reviewed and  Negative to the above problem except as noted.    PHYSICAL EXAM: VS:  BP 118/62   Wt 184 lb 4.8 oz (83.6 kg)   BMI 30.67 kg/m   GEN: Patient is an obese 69 year old, in no acute distress  HEENT: normal  Neck: no JVD, carotid bruits,  Cardiac: RRR; no murmurs.  Crisp valve sounds.,no lower extremity edema  Respiratory:  clear to auscultation bilaterally GI: soft, nontender, nondistended, + BS  No hepatomegaly  MS: no deformity Moving all extremities   Skin: warm and dry, no rash Neuro:  Strength and sensation are intact Psych: euthymic mood, full affect   EKG:  EKG is  ordered today.  Sinus rhythm 63 bpm.   Lipid Panel    Component Value Date/Time   CHOL 212 (H) 03/13/2019 1220   TRIG 343 (H) 03/13/2019 1220   HDL 35 (L) 03/13/2019 1220   CHOLHDL 6.1 (H) 03/13/2019 1220   CHOLHDL 3.1 07/08/2015 1510   VLDL 43 (H) 07/08/2015 1510   LDLCALC 117 (H) 03/13/2019 1220      Wt Readings from Last 3 Encounters:  12/25/19 184 lb 4.8 oz (83.6 kg)  03/13/19 188 lb 12.8 oz (85.6 kg)  05/21/18 197 lb (89.4 kg)      ASSESSMENT AND PLAN: 1.  Mitral valve Stevens patient is status post mechanical t MVR.  Valve sounds are good.  We will schedule for echo  to reevaluate gradients continue Coumadin.  CBC  was normal this July  2 dyslipidemia.  Lipids back in December LDL was 117 HDL 35 triglycerides 343.  More recent lipids the triglycerides were in the 200 range.  She is on Zetia.  She could not tolerate statins.  I would continue we discussed diet, trying to limit eliminate snacking cut meals to 2 times per day with a 16-hour break  Encouraged her to stay active.  We will plan for follow-up in clinic in 1 year .  Current medicines are reviewed at length with the patient today.  The patient does not have concerns regarding medicines.  Signed, Dorris Carnes, MD  12/25/2019 9:05 AM    [facility] [address], [place], [place]  [address] Phone: [phone]; Fax: [phone]

## 2019-12-25 ENCOUNTER — Other Ambulatory Visit: Payer: Self-pay

## 2019-12-25 ENCOUNTER — Ambulatory Visit: Payer: Medicare HMO | Admitting: Internal Medicine

## 2019-12-25 VITALS — BP 118/62 | Wt 184.3 lb

## 2019-12-25 DIAGNOSIS — I059 Rheumatic mitral valve disease, unspecified: Secondary | ICD-10-CM | POA: Diagnosis not present

## 2019-12-25 NOTE — Patient Instructions (Signed)
Medication Instructions:  No changes *If you need a refill on your cardiac medications before your next appointment, please call your pharmacy*   Lab Work: none If you have labs (blood work) drawn today and your tests are completely normal, you will receive your results only by: Marland Kitchen MyChart Message (if you have MyChart) OR . A paper copy in the mail If you have any lab test that is abnormal or we need to change your treatment, we will call you to review the results.   Testing/Procedures: Your physician has requested that you have an echocardiogram. Echocardiography is a painless test that uses sound waves to create images of your heart. It provides your doctor with information about the size and shape of your heart and how well your heart's chambers and valves are working. This procedure takes approximately one hour. There are no restrictions for this procedure.   Follow-Up: At [facility], you and your health needs are our priority.  As part of our continuing mission to provide you with exceptional heart care, we have created designated Provider Care Teams.  These Care Teams include your primary Cardiologist (physician) and Advanced Practice Providers (APPs -  Physician Assistants and Nurse Practitioners) who all work together to provide you with the care you need, when you need it.   Your next appointment:   12 month(s)  The format for your next appointment:   In Person  Provider:   You may see Dorris Carnes, MD or one of the following Advanced Practice Providers on your designated Care Team:    Richardson Dopp, PA-C  Robbie Lis, [place]    Other Instructions

## 2020-01-22 ENCOUNTER — Ambulatory Visit (INDEPENDENT_AMBULATORY_CARE_PROVIDER_SITE_OTHER): Payer: Medicare HMO

## 2020-01-22 ENCOUNTER — Other Ambulatory Visit: Payer: Self-pay

## 2020-01-22 DIAGNOSIS — Z954 Presence of other heart-valve replacement: Secondary | ICD-10-CM

## 2020-01-22 DIAGNOSIS — I059 Rheumatic mitral valve disease, unspecified: Secondary | ICD-10-CM | POA: Diagnosis not present

## 2020-01-22 DIAGNOSIS — Z5181 Encounter for therapeutic drug level monitoring: Secondary | ICD-10-CM | POA: Diagnosis not present

## 2020-01-22 LAB — POCT INR: INR: 3.4 — AB (ref 2.0–3.0)

## 2020-01-22 NOTE — Patient Instructions (Signed)
Description   Continue on same dosage 1 tablet daily. Recheck INR in 6 weeks. Call with any questions new medications or procedures  [phone]

## 2020-01-31 ENCOUNTER — Other Ambulatory Visit: Payer: Self-pay | Admitting: Internal Medicine

## 2020-02-07 ENCOUNTER — Other Ambulatory Visit: Payer: Self-pay

## 2020-02-07 ENCOUNTER — Ambulatory Visit (HOSPITAL_COMMUNITY): Payer: Medicare HMO | Attending: Cardiovascular Disease

## 2020-02-07 DIAGNOSIS — I059 Rheumatic mitral valve disease, unspecified: Secondary | ICD-10-CM | POA: Diagnosis not present

## 2020-02-07 LAB — ECHOCARDIOGRAM COMPLETE
Area-P 1/2: 2.5 cm2
S' Lateral: 3.4 cm

## 2020-03-06 ENCOUNTER — Ambulatory Visit (INDEPENDENT_AMBULATORY_CARE_PROVIDER_SITE_OTHER): Payer: Medicare HMO | Admitting: *Deleted

## 2020-03-06 ENCOUNTER — Other Ambulatory Visit: Payer: Self-pay

## 2020-03-06 DIAGNOSIS — Z5181 Encounter for therapeutic drug level monitoring: Secondary | ICD-10-CM | POA: Diagnosis not present

## 2020-03-06 DIAGNOSIS — I059 Rheumatic mitral valve disease, unspecified: Secondary | ICD-10-CM

## 2020-03-06 DIAGNOSIS — Z954 Presence of other heart-valve replacement: Secondary | ICD-10-CM

## 2020-03-06 LAB — POCT INR: INR: 2.9 (ref 2.0–3.0)

## 2020-03-06 NOTE — Patient Instructions (Signed)
Description   Continue taking Warfarin 1 tablet daily. Recheck INR in 6 weeks. Call with any questions new medications or procedures  [phone]

## 2020-04-17 ENCOUNTER — Other Ambulatory Visit: Payer: Self-pay

## 2020-04-17 ENCOUNTER — Ambulatory Visit (INDEPENDENT_AMBULATORY_CARE_PROVIDER_SITE_OTHER): Payer: Medicare HMO | Admitting: *Deleted

## 2020-04-17 DIAGNOSIS — Z5181 Encounter for therapeutic drug level monitoring: Secondary | ICD-10-CM

## 2020-04-17 DIAGNOSIS — Z954 Presence of other heart-valve replacement: Secondary | ICD-10-CM | POA: Diagnosis not present

## 2020-04-17 DIAGNOSIS — I059 Rheumatic mitral valve disease, unspecified: Secondary | ICD-10-CM

## 2020-04-17 LAB — POCT INR: INR: 2.8 (ref 2.0–3.0)

## 2020-04-17 NOTE — Patient Instructions (Signed)
Description   Continue taking Warfarin 1 tablet daily. Recheck INR in 6 weeks. Call with any questions new medications or procedures  [phone]      

## 2020-05-29 ENCOUNTER — Ambulatory Visit (INDEPENDENT_AMBULATORY_CARE_PROVIDER_SITE_OTHER): Payer: Medicare HMO | Admitting: *Deleted

## 2020-05-29 ENCOUNTER — Other Ambulatory Visit: Payer: Self-pay

## 2020-05-29 DIAGNOSIS — Z5181 Encounter for therapeutic drug level monitoring: Secondary | ICD-10-CM

## 2020-05-29 DIAGNOSIS — Z954 Presence of other heart-valve replacement: Secondary | ICD-10-CM | POA: Diagnosis not present

## 2020-05-29 DIAGNOSIS — I059 Rheumatic mitral valve disease, unspecified: Secondary | ICD-10-CM

## 2020-05-29 LAB — POCT INR: INR: 2.7 (ref 2.0–3.0)

## 2020-05-29 NOTE — Patient Instructions (Signed)
Description   Continue taking Warfarin 1 tablet daily. Recheck INR in 6 weeks. Call with any questions new medications or procedures  [phone]      

## 2020-07-10 ENCOUNTER — Other Ambulatory Visit: Payer: Self-pay

## 2020-07-10 ENCOUNTER — Ambulatory Visit (INDEPENDENT_AMBULATORY_CARE_PROVIDER_SITE_OTHER): Payer: Medicare HMO | Admitting: *Deleted

## 2020-07-10 DIAGNOSIS — Z5181 Encounter for therapeutic drug level monitoring: Secondary | ICD-10-CM

## 2020-07-10 DIAGNOSIS — Z954 Presence of other heart-valve replacement: Secondary | ICD-10-CM

## 2020-07-10 DIAGNOSIS — I059 Rheumatic mitral valve disease, unspecified: Secondary | ICD-10-CM

## 2020-07-10 LAB — POCT INR: INR: 3 (ref 2.0–3.0)

## 2020-07-10 NOTE — Patient Instructions (Signed)
Description   Continue taking Warfarin 1 tablet daily. Recheck INR in 6 weeks. Call with any questions new medications or procedures  [phone]

## 2020-07-26 ENCOUNTER — Other Ambulatory Visit: Payer: Self-pay | Admitting: Internal Medicine

## 2020-07-26 NOTE — Telephone Encounter (Signed)
Prescription refill request received for warfarin  Lov: [place], 12/25/2019 Next INR check: 5/18 Warfarin tablet strength: 5mg 

## 2020-08-21 ENCOUNTER — Other Ambulatory Visit: Payer: Self-pay

## 2020-08-21 ENCOUNTER — Ambulatory Visit (INDEPENDENT_AMBULATORY_CARE_PROVIDER_SITE_OTHER): Payer: Medicare HMO | Admitting: *Deleted

## 2020-08-21 DIAGNOSIS — Z954 Presence of other heart-valve replacement: Secondary | ICD-10-CM | POA: Diagnosis not present

## 2020-08-21 DIAGNOSIS — I059 Rheumatic mitral valve disease, unspecified: Secondary | ICD-10-CM

## 2020-08-21 DIAGNOSIS — Z5181 Encounter for therapeutic drug level monitoring: Secondary | ICD-10-CM

## 2020-08-21 LAB — POCT INR: INR: 3.2 — AB (ref 2.0–3.0)

## 2020-08-21 NOTE — Patient Instructions (Signed)
Description   Continue taking Warfarin 1 tablet daily. Recheck INR in 6 weeks. Call with any questions new medications or procedures  [phone]

## 2020-10-02 ENCOUNTER — Ambulatory Visit (INDEPENDENT_AMBULATORY_CARE_PROVIDER_SITE_OTHER): Payer: Medicare HMO | Admitting: *Deleted

## 2020-10-02 ENCOUNTER — Other Ambulatory Visit: Payer: Self-pay

## 2020-10-02 DIAGNOSIS — Z954 Presence of other heart-valve replacement: Secondary | ICD-10-CM

## 2020-10-02 DIAGNOSIS — I059 Rheumatic mitral valve disease, unspecified: Secondary | ICD-10-CM | POA: Diagnosis not present

## 2020-10-02 DIAGNOSIS — Z5181 Encounter for therapeutic drug level monitoring: Secondary | ICD-10-CM | POA: Diagnosis not present

## 2020-10-02 LAB — POCT INR: INR: 2.8 (ref 2.0–3.0)

## 2020-10-02 NOTE — Patient Instructions (Signed)
Description   Continue taking Warfarin 1 tablet daily. Recheck INR in 6 weeks. Call with any questions new medications or procedures  [phone]

## 2020-11-13 ENCOUNTER — Ambulatory Visit (INDEPENDENT_AMBULATORY_CARE_PROVIDER_SITE_OTHER): Payer: Medicare HMO

## 2020-11-13 ENCOUNTER — Other Ambulatory Visit: Payer: Self-pay

## 2020-11-13 DIAGNOSIS — Z954 Presence of other heart-valve replacement: Secondary | ICD-10-CM | POA: Diagnosis not present

## 2020-11-13 DIAGNOSIS — Z5181 Encounter for therapeutic drug level monitoring: Secondary | ICD-10-CM

## 2020-11-13 DIAGNOSIS — I059 Rheumatic mitral valve disease, unspecified: Secondary | ICD-10-CM | POA: Diagnosis not present

## 2020-11-13 LAB — POCT INR: INR: 3.5 — AB (ref 2.0–3.0)

## 2020-11-13 NOTE — Patient Instructions (Signed)
Description   Continue taking Warfarin 1 tablet daily. Recheck INR in 6 weeks. Call with any questions new medications or procedures  [phone]

## 2020-12-04 ENCOUNTER — Other Ambulatory Visit: Payer: Self-pay | Admitting: Radiology

## 2020-12-04 DIAGNOSIS — N6310 Unspecified lump in the right breast, unspecified quadrant: Secondary | ICD-10-CM

## 2020-12-23 ENCOUNTER — Ambulatory Visit
Admission: RE | Admit: 2020-12-23 | Discharge: 2020-12-23 | Disposition: A | Discharge status: Home / Self Care | Payer: Medicare HMO | Source: Ambulatory Visit | Attending: Radiology | Admitting: Radiology

## 2020-12-23 ENCOUNTER — Other Ambulatory Visit: Payer: Self-pay

## 2020-12-23 ENCOUNTER — Other Ambulatory Visit: Payer: Self-pay | Admitting: Radiology

## 2020-12-23 DIAGNOSIS — N6310 Unspecified lump in the right breast, unspecified quadrant: Secondary | ICD-10-CM

## 2020-12-25 ENCOUNTER — Other Ambulatory Visit: Payer: Self-pay

## 2020-12-25 ENCOUNTER — Ambulatory Visit (INDEPENDENT_AMBULATORY_CARE_PROVIDER_SITE_OTHER): Payer: Medicare HMO | Admitting: *Deleted

## 2020-12-25 DIAGNOSIS — Z954 Presence of other heart-valve replacement: Secondary | ICD-10-CM

## 2020-12-25 DIAGNOSIS — I059 Rheumatic mitral valve disease, unspecified: Secondary | ICD-10-CM | POA: Diagnosis not present

## 2020-12-25 DIAGNOSIS — Z5181 Encounter for therapeutic drug level monitoring: Secondary | ICD-10-CM

## 2020-12-25 LAB — POCT INR: INR: 3.9 — AB (ref 2.0–3.0)

## 2020-12-25 NOTE — Patient Instructions (Signed)
Description   Do not take any Warfarin today then continue taking Warfarin 1 tablet daily. Recheck INR in 5 weeks. Call with any questions new medications or procedures  [phone]

## 2021-01-11 ENCOUNTER — Other Ambulatory Visit: Payer: Self-pay | Admitting: Internal Medicine

## 2021-01-29 ENCOUNTER — Ambulatory Visit (INDEPENDENT_AMBULATORY_CARE_PROVIDER_SITE_OTHER): Payer: Medicare HMO

## 2021-01-29 ENCOUNTER — Other Ambulatory Visit: Payer: Self-pay

## 2021-01-29 DIAGNOSIS — Z5181 Encounter for therapeutic drug level monitoring: Secondary | ICD-10-CM | POA: Diagnosis not present

## 2021-01-29 DIAGNOSIS — I4891 Unspecified atrial fibrillation: Secondary | ICD-10-CM

## 2021-01-29 LAB — POCT INR: INR: 3.5 — AB (ref 2.0–3.0)

## 2021-01-29 NOTE — Patient Instructions (Signed)
Description   Continue taking Warfarin 1 tablet daily. Recheck INR in 6 weeks. Call with any questions new medications or procedures  [phone]

## 2021-02-05 ENCOUNTER — Telehealth: Payer: Self-pay | Admitting: Internal Medicine

## 2021-02-05 MED ORDER — AMOXICILLIN 500 MG PO TABS: ORAL_TABLET | ORAL | 6 refills | Status: DC

## 2021-02-05 NOTE — Telephone Encounter (Signed)
Amoxicillin has been refilled to [facility].

## 2021-02-05 NOTE — Telephone Encounter (Signed)
Pt c/o medication issue:  1. Name of Medication: antibiotic   2. How are you currently taking this medication (dosage and times per day)? Takes 4 tablets prior to dentist appointments  3. Are you having a reaction (difficulty breathing--STAT)? no  4. What is your medication issue? Patient states she always takes 4 tablets of an antibiotic prior to each dentist appointment. She says she has an appointment next Monday. She would like to prescription to go to [facility] in [place]

## 2021-02-17 NOTE — Progress Notes (Signed)
Cardiology Office Note   Date:  02/18/2021   ID:  Priscilla Stevens, DOB 25-Oct-1950, MRN [id number]  PCP:  Chesley Noon, MD  Cardiologist:   Dorris Carnes, MD    Follow-up of mitral valve disease.   History of Present Illness: Priscilla Stevens is a 70 y.o. female with a history ofrheumatic mitral valve disease s/p mitral valve replacement with mechanical MVR in 2014 (Sorin [employer] Optiform mechanical valve, 63mm).  Pt also has Hx of  COPD, diastolic CHF, GERD.  She did not have significant CAD on cath prior to MVR.  Of note she is not able to take ASA due to GI side effects.      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  I saw the pt in Sept 2021  SInce seen denies CP  Breathing is stable   No palpitations  No dizziness  Diet: AM  Breakfast bar Lunch   Sandwich Dinner  Meat/potatoes/veggies Drink  1/2 and 1/2 sweet tea   2 cups  Snack  NO snacks Current Meds  Medication Sig   albuterol (PROVENTIL HFA;VENTOLIN HFA) 108 (90 BASE) MCG/ACT inhaler Inhale 2 puffs into the lungs every 6 (six) hours as needed for wheezing or shortness of breath.   ALPRAZolam (XANAX) 0.25 MG tablet Take 1 tablet (0.25 mg total) by mouth at bedtime as needed for sleep or anxiety.   amoxicillin (AMOXIL) 500 MG tablet 4 tablets (2 G) 30-60 minutes prior to dental appointment.   calcium carbonate (OS-CAL) 600 MG TABS Take 600 mg by mouth 2 (two) times daily with a meal.    Cholecalciferol 25 MCG (1000 UT) tablet Take by mouth.   esomeprazole (NEXIUM) 20 MG capsule Take 40 mg by mouth daily.   ezetimibe (ZETIA) 10 MG tablet Take 10 mg by mouth daily.   furosemide (LASIX) 20 MG tablet TAKE 1 TABLET EVERY DAY   levocetirizine (XYZAL) 5 MG tablet Take 5 mg by mouth daily.   metoprolol succinate (TOPROL-XL) 25 MG 24 hr tablet TAKE 1 TABLET TWICE DAILY   warfarin (COUMADIN) 5 MG tablet TAKE 1 TABLET EVERY DAY  OR AS DIRECTED BY ANTICOAGULATION CLINIC     Allergies:   Statins and Aspirin   Past Medical History:  Diagnosis Date   Acute on chronic diastolic heart failure ([place]) 06/22/2012   Anxiety    CHF (congestive heart failure) ([facility])    Chronic diastolic congestive heart failure ([place]) 07/07/2012   CLL (chronic lymphocytic leukemia) ([facility])    Complication of anesthesia    Cough    Eval by pulm in past: essentially normal PFTs, cough ddx was variant asthma, upper airway cough syndrome (previously termed post nasal drip syndrome) or GERD,    Diverticulitis    Dysrhythmia    FAST HB TAKING METOPROLOL NOT FOR HTN   Fibromyalgia    GERD (gastroesophageal reflux disease)    Headache(784.0)    HX MIGRAINES    Heart murmur    History of echocardiogram    Echo 9/16:  Vigorous LVF, EF 65-70%, MVR ok (peak 12 mmHg, mean 6 mmHg), mild LAE, normal RVF.   History of tobacco abuse 06/22/2012   Mitral stenosis 06/20/2012   Morbid obesity ([place]) 06/20/2012   Pneumonia    2-3 YRS AGO    PONV (postoperative nausea and vomiting)    S/P mitral valve replacement with metallic valve 06/08/1935   29 mm Sorin Carbomedics  mechanical valve   Severe mitral regurgitation 06/20/2012    Past Surgical History:  Procedure Laterality Date   APPENDECTOMY     INTRAOPERATIVE TRANSESOPHAGEAL ECHOCARDIOGRAM N/A 07/07/2012   Procedure: INTRAOPERATIVE TRANSESOPHAGEAL ECHOCARDIOGRAM;  Surgeon: Rexene Alberts, MD;  Location: [place];  Service: Open Heart Surgery;  Laterality: N/A;   LEFT AND RIGHT HEART CATHETERIZATION WITH CORONARY ANGIOGRAM N/A 06/21/2012   Procedure: LEFT AND RIGHT HEART CATHETERIZATION WITH CORONARY ANGIOGRAM;  Surgeon: Larey Dresser, MD;  Location[facility] CATH LAB;  Service: Cardiovascular;  Laterality: N/A;   MITRAL VALVE REPLACEMENT N/A 07/07/2012   Procedure: MITRAL VALVE (MV) REPLACEMENT;  Surgeon: Rexene Alberts, MD;  Location: [facility];  Service: Open Heart Surgery;  Laterality: N/A;   NO PAST SURGERIES      TUBAL PREG    TEE WITHOUT CARDIOVERSION N/A 06/22/2012   Procedure: TRANSESOPHAGEAL ECHOCARDIOGRAM (TEE);  Surgeon: Larey Dresser, MD;  Location: [place];  Service: Cardiovascular;  Laterality: N/A;   TONSILLECTOMY     TOTAL VAGINAL HYSTERECTOMY       Social History:  The patient  reports that she quit smoking about 7 years ago. Her smoking use included cigarettes. She has a 36.00 pack-year smoking history. She has never used smokeless tobacco. She reports current alcohol use. She reports that she does not use drugs.   Family History:  The patient's family history includes Cancer - Lung in her father; Congestive Heart Failure in her mother.    ROS:  Please see the history of present illness. All other systems are reviewed and  Negative to the above problem except as noted.    PHYSICAL EXAM: VS:  BP 134/84   Pulse 62   Ht 5\' 5"  (1.651 m)   Wt 182 lb 12.8 oz (82.9 kg)   SpO2 97%   BMI 30.42 kg/m   GEN: Obese 70 yo  in no acute distress  HEENT: normal  Neck: no JVD, No bruits  Cardiac: RRR; no murmurs.  Crisp valve sounds;  NO lower extremity edema  Respiratory  Decreased airflow   Occasional wheeze   GI: soft, nontender, nondistended, + BS  No hepatomegaly  MS: no deformity Moving all extremities   Skin: warm and dry, no rash Neuro:  Strength and sensation are intact Psych: euthymic mood, full affect   EKG:  EKG is  ordered today.  Sinus rhythm 62 bpm.   Lipid Panel    Component Value Date/Time   CHOL 212 (H) 03/13/2019 1220   TRIG 343 (H) 03/13/2019 1220   HDL 35 (L) 03/13/2019 1220   CHOLHDL 6.1 (H) 03/13/2019 1220   CHOLHDL 3.1 07/08/2015 1510   VLDL 43 (H) 07/08/2015 1510   LDLCALC 117 (H) 03/13/2019 1220      Wt Readings from Last 3 Encounters:  02/18/21 182 lb 12.8 oz (82.9 kg)  12/25/19 184 lb 4.8 oz (83.6 kg)  03/13/19 188 lb 12.8 oz (85.6 kg)      ASSESSMENT AND PLAN 1.  Mitral valve disease  Patient is status post mechanical  MVR.  Echo 1 year  ago was good  Exam:  Crisp valve sounds    Follow clinically for now   INRs have been good    2 dyslipidemia.  Discussed diet   Will check lipids   Pt intolerant to statins   On Zetia    3  Diet   REcomm:   Discussed low carb, keto type      Encouraged her to  stay active.  We will plan for follow-up in clinic next fall  .  Current medicines are reviewed at length with the patient today.  The patient does not have concerns regarding medicines.  Signed, Dorris Carnes, MD  02/18/2021 10:02 AM    [facility] [address], [place], [place]  [address] Phone: [phone]; Fax: [phone]

## 2021-02-18 ENCOUNTER — Encounter: Payer: Self-pay | Admitting: Internal Medicine

## 2021-02-18 ENCOUNTER — Other Ambulatory Visit: Payer: Self-pay

## 2021-02-18 ENCOUNTER — Ambulatory Visit: Payer: Medicare HMO | Admitting: Internal Medicine

## 2021-02-18 VITALS — BP 134/84 | HR 62 | Ht 65.0 in | Wt 182.8 lb

## 2021-02-18 DIAGNOSIS — I5032 Chronic diastolic (congestive) heart failure: Secondary | ICD-10-CM

## 2021-02-18 DIAGNOSIS — E785 Hyperlipidemia, unspecified: Secondary | ICD-10-CM

## 2021-02-18 DIAGNOSIS — R0602 Shortness of breath: Secondary | ICD-10-CM | POA: Diagnosis not present

## 2021-02-18 DIAGNOSIS — Z954 Presence of other heart-valve replacement: Secondary | ICD-10-CM

## 2021-02-18 DIAGNOSIS — Z5181 Encounter for therapeutic drug level monitoring: Secondary | ICD-10-CM

## 2021-02-18 NOTE — Patient Instructions (Signed)
Medication Instructions:  NO CHANGES *If you need a refill on your cardiac medications before your next appointment, please call your pharmacy*   Lab Work: A1C BMET CBC LIPID VIT D AND TSH If you have labs (blood work) drawn today and your tests are completely normal, you will receive your results only by: [place] (if you have MyChart) OR A paper copy in the mail If you have any lab test that is abnormal or we need to change your treatment, we will call you to review the results.   Testing/Procedures: NONE   Follow-Up: At [facility], you and your health needs are our priority.  As part of our continuing mission to provide you with exceptional heart care, we have created designated Provider Care Teams.  These Care Teams include your primary Cardiologist (physician) and Advanced Practice Providers (APPs -  Physician Assistants and Nurse Practitioners) who all work together to provide you with the care you need, when you need it.  We recommend signing up for the patient portal called "MyChart".  Sign up information is provided on this After Visit Summary.  MyChart is used to connect with patients for Virtual Visits (Telemedicine).  Patients are able to view lab/test results, encounter notes, upcoming appointments, etc.  Non-urgent messages can be sent to your provider as well.   To learn more about what you can do with MyChart, go to [email or web address].    Your next appointment:   9 month(s)  The format for your next appointment:   In Person  Provider:   Dorris Carnes, MD     Other Instructions NONE

## 2021-02-19 LAB — LIPID PANEL
Chol/HDL Ratio: 4.3 ratio (ref 0.0–4.4)
Cholesterol, Total: 186 mg/dL (ref 100–199)
HDL: 43 mg/dL (ref >39)
LDL Chol Calc (NIH): 104 mg/dL — ABNORMAL HIGH (ref 0–99)
Triglycerides: 228 mg/dL — ABNORMAL HIGH (ref 0–149)
VLDL Cholesterol Cal: 39 mg/dL (ref 5–40)

## 2021-02-19 LAB — BASIC METABOLIC PANEL
BUN/Creatinine Ratio: 18 (ref 12–28)
BUN: 13 mg/dL (ref 8–27)
CO2: 27 mmol/L (ref 20–29)
Calcium: 9.5 mg/dL (ref 8.7–10.3)
Chloride: 102 mmol/L (ref 96–106)
Creatinine, Ser: 0.73 mg/dL (ref 0.57–1.00)
Glucose: 88 mg/dL (ref 70–99)
Potassium: 4.5 mmol/L (ref 3.5–5.2)
Sodium: 140 mmol/L (ref 134–144)
eGFR: 90 mL/min/{1.73_m2} (ref >59)

## 2021-02-19 LAB — CBC
Hematocrit: 43 % (ref 34.0–46.6)
Hemoglobin: 14.5 g/dL (ref 11.1–15.9)
MCH: 29.2 pg (ref 26.6–33.0)
MCHC: 33.7 g/dL (ref 31.5–35.7)
MCV: 87 fL (ref 79–97)
Platelets: 214 10*3/uL (ref 150–450)
RBC: 4.96 x10E6/uL (ref 3.77–5.28)
RDW: 12.4 % (ref 11.7–15.4)
WBC: 10.9 10*3/uL — ABNORMAL HIGH (ref 3.4–10.8)

## 2021-02-19 LAB — TSH: TSH: 0.92 u[IU]/mL (ref 0.450–4.500)

## 2021-02-19 LAB — HEMOGLOBIN A1C
Est. average glucose Bld gHb Est-mCnc: 114 mg/dL
Hgb A1c MFr Bld: 5.6 % (ref 4.8–5.6)

## 2021-02-19 LAB — VITAMIN D 25 HYDROXY (VIT D DEFICIENCY, FRACTURES): Vit D, 25-Hydroxy: 55 ng/mL (ref 30.0–100.0)

## 2021-03-12 ENCOUNTER — Other Ambulatory Visit: Payer: Self-pay

## 2021-03-12 ENCOUNTER — Other Ambulatory Visit: Payer: Self-pay | Admitting: Internal Medicine

## 2021-03-12 ENCOUNTER — Ambulatory Visit (INDEPENDENT_AMBULATORY_CARE_PROVIDER_SITE_OTHER): Payer: Medicare HMO | Admitting: *Deleted

## 2021-03-12 DIAGNOSIS — I059 Rheumatic mitral valve disease, unspecified: Secondary | ICD-10-CM

## 2021-03-12 DIAGNOSIS — Z954 Presence of other heart-valve replacement: Secondary | ICD-10-CM | POA: Diagnosis not present

## 2021-03-12 DIAGNOSIS — Z5181 Encounter for therapeutic drug level monitoring: Secondary | ICD-10-CM

## 2021-03-12 DIAGNOSIS — I4891 Unspecified atrial fibrillation: Secondary | ICD-10-CM

## 2021-03-12 LAB — POCT INR: INR: 3.5 — AB (ref 2.0–3.0)

## 2021-03-12 NOTE — Patient Instructions (Signed)
Description   Continue taking Warfarin 1 tablet daily. Recheck INR in 6 weeks. Call with any questions new medications or procedures  [phone] Main Fax for dental clearance form [phone]

## 2021-03-25 ENCOUNTER — Other Ambulatory Visit: Payer: Medicare HMO

## 2021-04-23 ENCOUNTER — Other Ambulatory Visit: Payer: Self-pay

## 2021-04-23 ENCOUNTER — Ambulatory Visit
Admission: RE | Admit: 2021-04-23 | Discharge: 2021-04-23 | Disposition: A | Discharge status: Home / Self Care | Payer: Medicare HMO | Source: Ambulatory Visit | Attending: Radiology | Admitting: Radiology

## 2021-04-23 ENCOUNTER — Ambulatory Visit: Payer: Medicare HMO | Admitting: *Deleted

## 2021-04-23 DIAGNOSIS — Z5181 Encounter for therapeutic drug level monitoring: Secondary | ICD-10-CM

## 2021-04-23 DIAGNOSIS — N6310 Unspecified lump in the right breast, unspecified quadrant: Secondary | ICD-10-CM

## 2021-04-23 DIAGNOSIS — I4891 Unspecified atrial fibrillation: Secondary | ICD-10-CM | POA: Diagnosis not present

## 2021-04-23 LAB — POCT INR: INR: 2.2 (ref 2.0–3.0)

## 2021-04-23 NOTE — Patient Instructions (Signed)
Description   -Take 1.5 tablets today -Then, continue taking Warfarin 1 tablet daily.  -Recheck INR in 3 weeks -Call with any questions new medications or procedures  [phone]

## 2021-04-25 ENCOUNTER — Other Ambulatory Visit: Payer: Medicare HMO

## 2021-05-01 ENCOUNTER — Other Ambulatory Visit: Payer: Self-pay

## 2021-05-01 ENCOUNTER — Ambulatory Visit: Payer: Medicare HMO

## 2021-05-01 DIAGNOSIS — I4891 Unspecified atrial fibrillation: Secondary | ICD-10-CM

## 2021-05-01 DIAGNOSIS — Z5181 Encounter for therapeutic drug level monitoring: Secondary | ICD-10-CM

## 2021-05-01 LAB — POCT INR: INR: 1.9 — AB (ref 2.0–3.0)

## 2021-05-01 NOTE — Patient Instructions (Signed)
Description   -Take 2 tablets today and 1.5 tablets tomorrow  -Then, continue taking Warfarin 1 tablet daily.  -Recheck INR in 1 week -Call with any questions new medications or procedures  [phone]

## 2021-05-07 ENCOUNTER — Other Ambulatory Visit: Payer: Self-pay

## 2021-05-07 ENCOUNTER — Ambulatory Visit: Payer: Medicare HMO

## 2021-05-07 DIAGNOSIS — I4891 Unspecified atrial fibrillation: Secondary | ICD-10-CM | POA: Diagnosis not present

## 2021-05-07 DIAGNOSIS — Z5181 Encounter for therapeutic drug level monitoring: Secondary | ICD-10-CM

## 2021-05-07 LAB — POCT INR: INR: 3.1 — AB (ref 2.0–3.0)

## 2021-05-07 NOTE — Patient Instructions (Signed)
Description   -Continue taking Warfarin 1 tablet daily.  -Recheck INR in 3 weeks -Call with any questions new medications or procedures  #[phone]

## 2021-05-28 ENCOUNTER — Other Ambulatory Visit: Payer: Self-pay

## 2021-05-28 ENCOUNTER — Ambulatory Visit: Payer: Medicare HMO

## 2021-05-28 DIAGNOSIS — Z5181 Encounter for therapeutic drug level monitoring: Secondary | ICD-10-CM | POA: Diagnosis not present

## 2021-05-28 DIAGNOSIS — I4891 Unspecified atrial fibrillation: Secondary | ICD-10-CM | POA: Diagnosis not present

## 2021-05-28 LAB — POCT INR: INR: 2.7 (ref 2.0–3.0)

## 2021-05-28 NOTE — Patient Instructions (Signed)
Description   -Continue taking Warfarin 1 tablet daily.  -Recheck INR in 5 weeks -Call with any questions new medications or procedures  #[phone]

## 2021-07-02 ENCOUNTER — Other Ambulatory Visit: Payer: Self-pay

## 2021-07-02 ENCOUNTER — Ambulatory Visit: Payer: Medicare HMO | Admitting: *Deleted

## 2021-07-02 DIAGNOSIS — I4891 Unspecified atrial fibrillation: Secondary | ICD-10-CM | POA: Diagnosis not present

## 2021-07-02 DIAGNOSIS — Z954 Presence of other heart-valve replacement: Secondary | ICD-10-CM | POA: Diagnosis not present

## 2021-07-02 DIAGNOSIS — Z5181 Encounter for therapeutic drug level monitoring: Secondary | ICD-10-CM

## 2021-07-02 DIAGNOSIS — I059 Rheumatic mitral valve disease, unspecified: Secondary | ICD-10-CM | POA: Diagnosis not present

## 2021-07-02 LAB — POCT INR: INR: 2.9 (ref 2.0–3.0)

## 2021-07-02 NOTE — Patient Instructions (Signed)
Description   ?Continue taking Warfarin 1 tablet daily. Recheck INR in 6 weeks. Call with any questions new medications or procedures  #[phone]  ? ?  ?  ?

## 2021-08-13 ENCOUNTER — Ambulatory Visit: Payer: Medicare HMO | Admitting: *Deleted

## 2021-08-13 DIAGNOSIS — I4891 Unspecified atrial fibrillation: Secondary | ICD-10-CM

## 2021-08-13 DIAGNOSIS — Z5181 Encounter for therapeutic drug level monitoring: Secondary | ICD-10-CM | POA: Diagnosis not present

## 2021-08-13 DIAGNOSIS — I059 Rheumatic mitral valve disease, unspecified: Secondary | ICD-10-CM

## 2021-08-13 DIAGNOSIS — Z954 Presence of other heart-valve replacement: Secondary | ICD-10-CM

## 2021-08-13 LAB — POCT INR: INR: 2.9 (ref 2.0–3.0)

## 2021-08-13 NOTE — Patient Instructions (Signed)
Description   ?Continue taking Warfarin 1 tablet daily. Recheck INR in 6 weeks. Call with any questions new medications or procedures  #[phone]  ? ?  ?  ?

## 2021-09-24 ENCOUNTER — Ambulatory Visit (INDEPENDENT_AMBULATORY_CARE_PROVIDER_SITE_OTHER): Payer: Medicare HMO | Admitting: *Deleted

## 2021-09-24 DIAGNOSIS — Z954 Presence of other heart-valve replacement: Secondary | ICD-10-CM

## 2021-09-24 DIAGNOSIS — Z5181 Encounter for therapeutic drug level monitoring: Secondary | ICD-10-CM | POA: Diagnosis not present

## 2021-09-24 DIAGNOSIS — I059 Rheumatic mitral valve disease, unspecified: Secondary | ICD-10-CM

## 2021-09-24 DIAGNOSIS — I4891 Unspecified atrial fibrillation: Secondary | ICD-10-CM

## 2021-09-24 LAB — POCT INR: INR: 3.3 — AB (ref 2.0–3.0)

## 2021-09-24 NOTE — Patient Instructions (Signed)
Description   Continue taking Warfarin 1 tablet daily. Recheck INR in 6 weeks. Call with any questions new medications or procedures  #[phone]

## 2021-10-01 ENCOUNTER — Other Ambulatory Visit: Payer: Self-pay

## 2021-10-01 DIAGNOSIS — Z952 Presence of prosthetic heart valve: Secondary | ICD-10-CM

## 2021-10-01 MED ORDER — METOPROLOL SUCCINATE ER 25 MG PO TB24: 25.0000 mg | ORAL_TABLET | Freq: Two times a day (BID) | ORAL | 1 refills | Status: DC

## 2021-10-01 MED ORDER — WARFARIN SODIUM 5 MG PO TABS: ORAL_TABLET | ORAL | 0 refills | Status: DC

## 2021-10-01 NOTE — Telephone Encounter (Signed)
Prescription refill request received for warfarin Lov: 02/18/21 [facility])  Next INR check: 11/05/21 Warfarin tablet strength: '5mg'$   Appropriate dose and refill sent to requested pharmacy.

## 2021-11-05 ENCOUNTER — Ambulatory Visit: Payer: Medicare HMO

## 2021-11-05 DIAGNOSIS — Z954 Presence of other heart-valve replacement: Secondary | ICD-10-CM

## 2021-11-05 DIAGNOSIS — I4891 Unspecified atrial fibrillation: Secondary | ICD-10-CM

## 2021-11-05 DIAGNOSIS — Z5181 Encounter for therapeutic drug level monitoring: Secondary | ICD-10-CM | POA: Diagnosis not present

## 2021-11-05 DIAGNOSIS — I059 Rheumatic mitral valve disease, unspecified: Secondary | ICD-10-CM

## 2021-11-05 LAB — POCT INR: INR: 3.7 — AB (ref 2.0–3.0)

## 2021-11-05 NOTE — Patient Instructions (Signed)
Description   Only take 0.5 tablet today and then continue taking Warfarin 1 tablet daily. Recheck INR in 5 weeks. Call with any questions new medications or procedures  #[phone]

## 2021-12-10 ENCOUNTER — Ambulatory Visit: Payer: Medicare HMO | Attending: Internal Medicine

## 2021-12-10 DIAGNOSIS — I4891 Unspecified atrial fibrillation: Secondary | ICD-10-CM | POA: Diagnosis not present

## 2021-12-10 DIAGNOSIS — Z5181 Encounter for therapeutic drug level monitoring: Secondary | ICD-10-CM | POA: Diagnosis not present

## 2021-12-10 LAB — POCT INR: INR: 4.3 — AB (ref 2.0–3.0)

## 2021-12-10 NOTE — Patient Instructions (Signed)
Description   Hold today's dose and then START taking Warfarin 1 tablet daily except 0.5 tablet on Saturdays. Recheck INR in 2 weeks. Call with any questions new medications or procedures  #[phone]

## 2021-12-24 ENCOUNTER — Ambulatory Visit: Payer: Medicare HMO | Attending: Cardiovascular Disease | Admitting: *Deleted

## 2021-12-24 DIAGNOSIS — I059 Rheumatic mitral valve disease, unspecified: Secondary | ICD-10-CM

## 2021-12-24 DIAGNOSIS — Z5181 Encounter for therapeutic drug level monitoring: Secondary | ICD-10-CM | POA: Diagnosis not present

## 2021-12-24 DIAGNOSIS — Z954 Presence of other heart-valve replacement: Secondary | ICD-10-CM

## 2021-12-24 DIAGNOSIS — I4891 Unspecified atrial fibrillation: Secondary | ICD-10-CM | POA: Diagnosis not present

## 2021-12-24 LAB — POCT INR: INR: 3.7 — AB (ref 2.0–3.0)

## 2021-12-24 NOTE — Patient Instructions (Signed)
Description   Today take 1/2 tablet then START taking the dose you should be taking which is, Warfarin 1 tablet daily except 0.5 tablet on Saturdays. Recheck INR in 3 weeks. Call with any questions new medications or procedures  #[phone]

## 2022-01-05 ENCOUNTER — Telehealth: Payer: Self-pay | Admitting: *Deleted

## 2022-01-05 NOTE — Telephone Encounter (Addendum)
Pt left a voicemail stating to call her back in reference to new meds she has been placed on for bronchitis. She stated prednisone and an antibiotic. Pt is aware the meds interact with warfarin and she needs an appt on Thursday this week. There was no answer so left her a message to call back.  Per Care Everywhere she has been placed on: prednisone '20mg'$  tapered dose and amoxicilin 875 mg BID for 7 days.   Prednisone dose: Take 3 tabs on first day, then 2 tabs daily for 2 days, then 1 tab daily for 2 days, then 1/2 tab daily for 2 days '60mg'$  10/2 '40mg'$  10/3, 10/4 '20mg'$  10/5, 10/6 '10mg'$  10/7, 10/8  Pt returned the call and she confirmed the doses of prednisone. Pt will have some leafy veggies today or tomorrow & take 1/2 tablet today of warfarin and appt set for Thursday. Pt confirmed instructions.

## 2022-01-08 ENCOUNTER — Ambulatory Visit: Payer: Medicare HMO | Attending: Internal Medicine | Admitting: *Deleted

## 2022-01-08 DIAGNOSIS — Z5181 Encounter for therapeutic drug level monitoring: Secondary | ICD-10-CM | POA: Diagnosis not present

## 2022-01-08 DIAGNOSIS — Z954 Presence of other heart-valve replacement: Secondary | ICD-10-CM

## 2022-01-08 DIAGNOSIS — I4891 Unspecified atrial fibrillation: Secondary | ICD-10-CM | POA: Diagnosis not present

## 2022-01-08 DIAGNOSIS — I059 Rheumatic mitral valve disease, unspecified: Secondary | ICD-10-CM

## 2022-01-08 LAB — POCT INR: INR: 2.7 (ref 2.0–3.0)

## 2022-01-08 NOTE — Patient Instructions (Signed)
Description   Continue taking Warfarin 1 tablet daily except 1/2 tablet on Saturdays. Recheck INR in 3 weeks. Call with any questions new medications or procedures  #[phone]

## 2022-01-14 ENCOUNTER — Ambulatory Visit: Payer: Medicare HMO

## 2022-01-28 ENCOUNTER — Ambulatory Visit: Payer: Medicare HMO

## 2022-02-03 ENCOUNTER — Ambulatory Visit: Payer: Medicare HMO | Attending: Cardiology | Admitting: *Deleted

## 2022-02-03 DIAGNOSIS — Z954 Presence of other heart-valve replacement: Secondary | ICD-10-CM | POA: Diagnosis not present

## 2022-02-03 DIAGNOSIS — Z5181 Encounter for therapeutic drug level monitoring: Secondary | ICD-10-CM

## 2022-02-03 LAB — POCT INR: POC INR: 3.6

## 2022-02-03 NOTE — Patient Instructions (Signed)
Description   Continue taking Warfarin 1 tablet daily except 1/2 tablet on Saturdays. Recheck INR in 3 weeks. Eat an extra serving of greens tonight.  Call with any questions new medications or procedures  #[phone]

## 2022-02-25 ENCOUNTER — Ambulatory Visit: Payer: Medicare HMO | Attending: Cardiology | Admitting: *Deleted

## 2022-02-25 DIAGNOSIS — I059 Rheumatic mitral valve disease, unspecified: Secondary | ICD-10-CM | POA: Diagnosis not present

## 2022-02-25 DIAGNOSIS — Z5181 Encounter for therapeutic drug level monitoring: Secondary | ICD-10-CM | POA: Diagnosis not present

## 2022-02-25 DIAGNOSIS — Z954 Presence of other heart-valve replacement: Secondary | ICD-10-CM | POA: Diagnosis not present

## 2022-02-25 LAB — POCT INR: INR: 2.8 (ref 2.0–3.0)

## 2022-02-25 NOTE — Patient Instructions (Addendum)
Description   Continue taking Warfarin 1 tablet daily except 1/2 tablet on Saturdays. Recheck INR in 4 weeks. Call with any questions new medications or procedures  #[phone] or [phone]      

## 2022-03-05 ENCOUNTER — Other Ambulatory Visit: Payer: Self-pay | Admitting: Internal Medicine

## 2022-03-05 DIAGNOSIS — Z952 Presence of prosthetic heart valve: Secondary | ICD-10-CM

## 2022-03-05 MED ORDER — WARFARIN SODIUM 5 MG PO TABS: ORAL_TABLET | ORAL | 1 refills | Status: DC

## 2022-03-05 NOTE — Addendum Note (Signed)
Addended by: Carter Kitten D on: 03/05/2022 02:32 PM   Modules accepted: Orders

## 2022-03-05 NOTE — Telephone Encounter (Signed)
Pt called stating that her medication warfarin was sent to the wrong pharmacy, I resent pt's medication to the correct pharmacy as requested. Confirmation received.

## 2022-03-05 NOTE — Telephone Encounter (Signed)
Refill request for warfarin:  Last INR was 2.8 on 02/25/22 Next INR due 03/25/22 LOV was 02/18/21  Lizbeth Bark MD  (Has appt with Dr Harrington Challenger 03/18/22)  Refill approved.

## 2022-03-15 NOTE — Progress Notes (Unsigned)
Cardiology Office Note   Date:  03/18/2022   ID:  Priscilla Stevens, DOB 1951-03-07, MRN [id number]  PCP:  Chesley Noon, MD  Cardiologist:   Dorris Carnes, MD    Follow-up of mitral valve disease.   History of Present Illness: Priscilla Stevens is a 71 y.o. 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  Pt also has Hx of  COPD, diastolic CHF, GERD.  She did not have significant CAD on cath prior to MVR.  Of note she is not able to take ASA due to GI side effects.      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   I saw the pt in Nov 2022  Since seen she says she is recovering from a bronchitis   Still wheezing some   Denis CP   No dizziness  No palpitatoins    Current Meds  Medication Sig   albuterol (PROVENTIL HFA;VENTOLIN HFA) 108 (90 BASE) MCG/ACT inhaler Inhale 2 puffs into the lungs every 6 (six) hours as needed for wheezing or shortness of breath.   ALPRAZolam (XANAX) 0.25 MG tablet Take 1 tablet (0.25 mg total) by mouth at bedtime as needed for sleep or anxiety.   amoxicillin (AMOXIL) 500 MG tablet 4 tablets (2 G) 30-60 minutes prior to dental appointment.   calcium carbonate (OS-CAL) 600 MG TABS Take 600 mg by mouth 2 (two) times daily with a meal.    Cholecalciferol 25 MCG (1000 UT) tablet Take by mouth.   esomeprazole (NEXIUM) 20 MG capsule Take 40 mg by mouth daily.   ezetimibe (ZETIA) 10 MG tablet Take 10 mg by mouth daily.   furosemide (LASIX) 20 MG tablet Take 1 tablet (20 mg total) by mouth daily.   levocetirizine (XYZAL) 5 MG tablet Take 5 mg by mouth daily.   metoprolol succinate (TOPROL-XL) 25 MG 24 hr tablet Take 1 tablet (25 mg total) by mouth in the morning and at bedtime.   warfarin (COUMADIN) 5 MG tablet TAKE 1 TABLET BY MOUTH EVERY DAY OR AS  DIRECTED BY ANTICOAGULATION CLINIC     Allergies:   Statins and Aspirin   Past Medical History:  Diagnosis Date   Acute on chronic diastolic heart failure ([facility] 06/22/2012   Anxiety    CHF (congestive heart failure) (HCC)    Chronic diastolic congestive heart failure [place] 07/07/2012   CLL (chronic lymphocytic leukemia) (HCC)    Complication of anesthesia    Cough    Eval by pulm in past: essentially normal PFTs, cough ddx was variant asthma, upper airway cough syndrome (previously termed post nasal drip syndrome) or GERD,    Diverticulitis    Dysrhythmia    FAST HB TAKING METOPROLOL NOT FOR HTN   Fibromyalgia    GERD (gastroesophageal reflux disease)    Headache(784.0)    HX MIGRAINES    Heart murmur    History of echocardiogram    Echo 9/16:  Vigorous LVF, EF 65-70%, MVR ok (peak 12 mmHg, mean 6 mmHg), mild LAE, normal RVF.   History of tobacco abuse 06/22/2012   Mitral stenosis 06/20/2012   Morbid obesity ([facility] 06/20/2012   Pneumonia    2-3 YRS AGO    PONV (postoperative nausea and vomiting)    S/P mitral valve replacement with metallic valve 44/09/5033  29 mm Sorin  [employer] mechanical valve   Severe mitral regurgitation 06/20/2012    Past Surgical History:  Procedure Laterality Date   APPENDECTOMY     INTRAOPERATIVE TRANSESOPHAGEAL ECHOCARDIOGRAM N/A 07/07/2012   Procedure: INTRAOPERATIVE TRANSESOPHAGEAL ECHOCARDIOGRAM;  Surgeon: Rexene Alberts, MD;  Location: [facility];  Service: Open Heart Surgery;  Laterality: N/A;   LEFT AND RIGHT HEART CATHETERIZATION WITH CORONARY ANGIOGRAM N/A 06/21/2012   Procedure: LEFT AND RIGHT HEART CATHETERIZATION WITH CORONARY ANGIOGRAM;  Surgeon: Larey Dresser, MD;  Location: [facility] CATH LAB;  Service: Cardiovascular;  Laterality: N/A;   MITRAL VALVE REPLACEMENT N/A 07/07/2012   Procedure: MITRAL VALVE (MV) REPLACEMENT;  Surgeon: Rexene Alberts, MD;  Location: [place];  Service: Open Heart Surgery;  Laterality: N/A;   NO PAST SURGERIES     TUBAL PREG     TEE WITHOUT CARDIOVERSION N/A 06/22/2012   Procedure: TRANSESOPHAGEAL ECHOCARDIOGRAM (TEE);  Surgeon: Larey Dresser, MD;  Location: [facility];  Service: Cardiovascular;  Laterality: N/A;   TONSILLECTOMY     TOTAL VAGINAL HYSTERECTOMY       Social History:  The patient  reports that she quit smoking about 8 years ago. Her smoking use included cigarettes. She has a 36.00 pack-year smoking history. She has never used smokeless tobacco. She reports current alcohol use. She reports that she does not use drugs.   Family History:  The patient's family history includes Cancer - Lung in her father; Congestive Heart Failure in her mother.    ROS:  Please see the history of present illness. All other systems are reviewed and  Negative to the above problem except as noted.    PHYSICAL EXAM: VS:  Pulse 62   Ht '5\' 5"'$  (1.651 m)   Wt 181 lb 9.6 oz (82.4 kg)   SpO2 96%   BMI 30.22 kg/m   GEN: Obese 71 yo  in no acute distress  HEENT: normal  Neck: no JVD, No bruits  Cardiac: RRR; no murmurs.  Crisp valve sounds;  NO lower extremity edema  Respiratory  Decreased airflow   Occasional wheeze   GI: soft, nontender, nondistended, + BS  No hepatomegaly  MS: no deformity Moving all extremities   Skin: warm and dry, no rash Neuro:  Strength and sensation are intact Psych: euthymic mood, full affect   EKG:  EKG is  ordered today.  Sinus rhythm 62 bpm.  Echo   02/2020  1. Left ventricular ejection fraction, by estimation, is 50 to 55%. The  left ventricle has low normal function. The left ventricle has no regional  wall motion abnormalities. There is mild concentric left ventricular  hypertrophy. Left ventricular  diastolic parameters are indeterminate.   2. Right ventricular systolic function is normal. The right ventricular  size is normal. There is normal pulmonary artery systolic pressure.   3. Left atrial size was severely dilated.   4. The mitral valve has been repaired/replaced. No  evidence of mitral  valve regurgitation. No evidence of mitral stenosis. Procedure Date: 2014.  Echo findings are consistent with normal structure and function of the  mitral valve prosthesis.   5. The aortic valve is tricuspid. Aortic valve regurgitation is not  visualized. No aortic stenosis is present.   6. The inferior vena cava is normal in size with greater than 50%  respiratory variability, suggesting right atrial pressure of 3 mmHg.  Lipid Panel    Component Value Date/Time   CHOL 186 02/18/2021 1021   TRIG 228 (H) 02/18/2021 1021  HDL 43 02/18/2021 1021   CHOLHDL 4.3 02/18/2021 1021   CHOLHDL 3.1 07/08/2015 1510   VLDL 43 (H) 07/08/2015 1510   LDLCALC 104 (H) 02/18/2021 1021      Wt Readings from Last 3 Encounters:  03/18/22 181 lb 9.6 oz (82.4 kg)  02/18/21 182 lb 12.8 oz (82.9 kg)  12/25/19 184 lb 4.8 oz (83.6 kg)      ASSESSMENT AND PLAN 1.  Mitral valve disease  Patient is status post mechanical  MVR.   Last echo in 2021 functioning well   INRs have not been low  Exam with crisp valve sounds   Follow    2 dyslipidemia.  Intolerant to statins   ON Zetia  Check labs   3  HTN  BP is high   160/90 R arm  L arm lower   She says it is never this high   Take BP at home  Bring cuff and readings in to clinic in about 4 wks    3  Diet   REcomm:   Discussed low carb, Mediterranean style diet      Tentative follow up in Aug  Sooner if BP high       Encouraged her to stay active.  We will plan for follow-up in clinic next fall  .  Current medicines are reviewed at length with the patient today.  The patient does not have concerns regarding medicines.  Signed, Dorris Carnes, MD  03/18/2022 10:34 AM    [address], [place], [place]  [address] Phone: [phone]; Fax: [phone]

## 2022-03-18 ENCOUNTER — Encounter: Payer: Self-pay | Admitting: Internal Medicine

## 2022-03-18 ENCOUNTER — Ambulatory Visit: Payer: Medicare HMO | Attending: Internal Medicine | Admitting: Internal Medicine

## 2022-03-18 VITALS — HR 62 | Ht 65.0 in | Wt 181.6 lb

## 2022-03-18 DIAGNOSIS — E785 Hyperlipidemia, unspecified: Secondary | ICD-10-CM

## 2022-03-18 DIAGNOSIS — I4891 Unspecified atrial fibrillation: Secondary | ICD-10-CM | POA: Diagnosis not present

## 2022-03-18 DIAGNOSIS — I5032 Chronic diastolic (congestive) heart failure: Secondary | ICD-10-CM

## 2022-03-18 DIAGNOSIS — Z5181 Encounter for therapeutic drug level monitoring: Secondary | ICD-10-CM | POA: Diagnosis not present

## 2022-03-18 DIAGNOSIS — I059 Rheumatic mitral valve disease, unspecified: Secondary | ICD-10-CM

## 2022-03-18 NOTE — Patient Instructions (Signed)
Medication Instructions:   *If you need a refill on your cardiac medications before your next appointment, please call your pharmacy*   Lab Work: NMR, APO B, LIPO A, CBC, BMET TODAY  If you have labs (blood work) drawn today and your tests are completely normal, you will receive your results only by: [place] (if you have MyChart) OR A paper copy in the mail If you have any lab test that is abnormal or we need to change your treatment, we will call you to review the results.   Testing/Procedures:    Follow-Up: At [facility], you and your health needs are our priority.  As part of our continuing mission to provide you with exceptional heart care, we have created designated Provider Care Teams.  These Care Teams include your primary Cardiologist (physician) and Advanced Practice Providers (APPs -  Physician Assistants and Nurse Practitioners) who all work together to provide you with the care you need, when you need it.  We recommend signing up for the patient portal called "MyChart".  Sign up information is provided on this After Visit Summary.  MyChart is used to connect with patients for Virtual Visits (Telemedicine).  Patients are able to view lab/test results, encounter notes, upcoming appointments, etc.  Non-urgent messages can be sent to your provider as well.   To learn more about what you can do with MyChart, go to [email or web address].    Your next appointment:   8 month(s)  The format for your next appointment:   In Person  Provider:   Dorris Carnes, MD     Other Instructions  NURSE VISIT ANY TIME IN JANUARY FOR BP CHECK.... BRING LOG AND YOUR CUFF WITH YOU   Important Information About Sugar

## 2022-03-19 LAB — CBC
Hematocrit: 41.6 % (ref 34.0–46.6)
Hemoglobin: 13.4 g/dL (ref 11.1–15.9)
MCH: 27.3 pg (ref 26.6–33.0)
MCHC: 32.2 g/dL (ref 31.5–35.7)
MCV: 85 fL (ref 79–97)
Platelets: 247 10*3/uL (ref 150–450)
RBC: 4.91 x10E6/uL (ref 3.77–5.28)
RDW: 12.9 % (ref 11.7–15.4)
WBC: 9.4 10*3/uL (ref 3.4–10.8)

## 2022-03-19 LAB — BASIC METABOLIC PANEL
BUN/Creatinine Ratio: 14 (ref 12–28)
BUN: 13 mg/dL (ref 8–27)
CO2: 27 mmol/L (ref 20–29)
Calcium: 9.5 mg/dL (ref 8.7–10.3)
Chloride: 103 mmol/L (ref 96–106)
Creatinine, Ser: 0.93 mg/dL (ref 0.57–1.00)
Glucose: 100 mg/dL — ABNORMAL HIGH (ref 70–99)
Potassium: 4.1 mmol/L (ref 3.5–5.2)
Sodium: 140 mmol/L (ref 134–144)
eGFR: 67 mL/min/{1.73_m2} (ref >59)

## 2022-03-19 LAB — NMR, LIPOPROFILE
Cholesterol, Total: 197 mg/dL (ref 100–199)
HDL Particle Number: 35.1 umol/L (ref >=30.5)
HDL-C: 43 mg/dL (ref >39)
LDL Particle Number: 1446 nmol/L — ABNORMAL HIGH (ref <1000)
LDL Size: 19.8 nm — ABNORMAL LOW (ref >20.5)
LDL-C (NIH Calc): 117 mg/dL — ABNORMAL HIGH (ref 0–99)
LP-IR Score: 65 — ABNORMAL HIGH (ref <=45)
Small LDL Particle Number: 1123 nmol/L — ABNORMAL HIGH (ref <=527)
Triglycerides: 210 mg/dL — ABNORMAL HIGH (ref 0–149)

## 2022-03-19 LAB — APOLIPOPROTEIN B: Apolipoprotein B: 106 mg/dL — ABNORMAL HIGH (ref <90)

## 2022-03-19 LAB — LIPOPROTEIN A (LPA): Lipoprotein (a): 26.1 nmol/L (ref <75.0)

## 2022-03-24 ENCOUNTER — Telehealth: Payer: Self-pay

## 2022-03-24 DIAGNOSIS — E785 Hyperlipidemia, unspecified: Secondary | ICD-10-CM

## 2022-03-24 NOTE — Telephone Encounter (Signed)
-----   Message from Fay Records, MD sent at 03/23/2022  4:58 PM EST ----- CBC is normal  Electrolytes and kidney function are normal  Lpa is low    ApoB is high    Lipomed showd LDL 117  Triglycerides are 210 (goes along with ApoB0    CT scan showed moderate plaquing of aorta   She is intolerant to statins  Need to get LDL down Need to work on diet   Cut back on sugar (24 g added sugar per day or less) and low carb/minimal carb. (She has fatty liver changes suggested too many carbs)     I would recomm adding Repatha to get LDL down  Repeat lipomed and ApoB ins 8 wks

## 2022-03-24 NOTE — Telephone Encounter (Signed)
Result and message sent to the pts My Chart for their review.    Referral placed for the Repatha.

## 2022-03-25 ENCOUNTER — Ambulatory Visit: Payer: Medicare HMO | Attending: Internal Medicine

## 2022-03-25 DIAGNOSIS — Z954 Presence of other heart-valve replacement: Secondary | ICD-10-CM | POA: Diagnosis not present

## 2022-03-25 DIAGNOSIS — Z5181 Encounter for therapeutic drug level monitoring: Secondary | ICD-10-CM

## 2022-03-25 DIAGNOSIS — I059 Rheumatic mitral valve disease, unspecified: Secondary | ICD-10-CM

## 2022-03-25 LAB — POCT INR: INR: 3 (ref 2.0–3.0)

## 2022-03-25 NOTE — Patient Instructions (Signed)
Description   Continue taking Warfarin 1 tablet daily except 1/2 tablet on Saturdays. Recheck INR in 5 weeks. Call with any questions new medications or procedures  #[phone] or [phone]

## 2022-03-31 ENCOUNTER — Other Ambulatory Visit: Payer: Self-pay

## 2022-03-31 MED ORDER — METOPROLOL SUCCINATE ER 25 MG PO TB24: 25.0000 mg | ORAL_TABLET | Freq: Two times a day (BID) | ORAL | 3 refills | Status: DC

## 2022-04-15 ENCOUNTER — Ambulatory Visit: Payer: Medicare HMO | Attending: Internal Medicine | Admitting: *Deleted

## 2022-04-15 DIAGNOSIS — I10 Essential (primary) hypertension: Secondary | ICD-10-CM

## 2022-04-15 NOTE — Patient Instructions (Addendum)
   Nurse Visit   Date of Encounter: 04/15/2022 ID: Priscilla Stevens, DOB 1951-01-12, MRN [id number]  PCP:  Chesley Noon, MD   [facility] Cardiologist:  Dorris Carnes, MD      Visit Details   VS:  BP 118/73 (BP Location: Left Arm) Comment: LT Arm Home Cuff Comment (BP Location): Lt Arm Home Cuff  Pulse 80   Resp 20   Wt 182 lb 0.4 oz (82.6 kg)   BMI 30.29 kg/m  , BMI Body mass index is 30.29 kg/m.  Wt Readings from Last 3 Encounters:  04/15/22 182 lb 0.4 oz (82.6 kg)  03/18/22 181 lb 9.6 oz (82.4 kg)  02/18/21 182 lb 12.8 oz (82.9 kg)     Reason for visit: RN Visit to assess BP Performed today: Vital signs, BP Assessment, RT / LT Arm, Home BP Cuff Assessment Changes (medications, testing, etc.) : NONE  Length of Visit: 30 minutes    Medications Adjustments/Labs and Tests Ordered: Per Dr. Harrington Challenger order, patient came into [facility] on [address] for an RN Visit and BP check in RT / LT Arm, and to assess her Home Blood Pressure Cuff.  Pt vital signs upon arrival were WNL.  Please see blood pressure assessments below, Manual clinic BP cuff VS Home BP cuff: 140/78, Rt Arm manual clinic BP cuff-  HR 72 143/83, Rt Arm automatic home BP cuff, brought to clinic visit. HR 80  114/72, Lt Arm manual clinic BP cuff, HR 80 118/73, Lt Arm automatic home BP cuff.  HR 83    Pt blood pressure lower in noticeably lower in Left arm reading, than in her Right arm.  Pt also brought in blood pressure readings taken in with her home BP cuff in her Right arm.  Date Range: ( 03/18/22 to 04/14/22 )  Will have this scanned into patient's chart.  Pt is taking medications as ordered.  I spent time educating patient on blood pressure / heart rate, vital sign standards, and contributing factors that lead to elevated BP readings, and how diet plays its part in these changes.  Pt advised to maintain her current plan of care ordered by Dr Harrington Challenger, and that I will forward this report to her  for assessment.  Pt told we will reach out to her with any plan of care changes, and was thanked for bringing in her BP readings, as asked to do by Dr. Harrington Challenger.      Signed, Varney Daily, RN  04/15/2022 11:28 AM

## 2022-04-22 ENCOUNTER — Other Ambulatory Visit: Payer: Self-pay

## 2022-04-22 MED ORDER — AMLODIPINE BESYLATE 2.5 MG PO TABS: 2.5000 mg | ORAL_TABLET | Freq: Every day | ORAL | 3 refills | Status: DC

## 2022-04-22 NOTE — Progress Notes (Signed)
She should check BP in R arm then from now on   Always measure arm that has higher readings  I would add 2.5 mg amlodipine to regimen    Keep following BP

## 2022-04-29 ENCOUNTER — Ambulatory Visit: Payer: Medicare HMO | Attending: Cardiovascular Disease | Admitting: *Deleted

## 2022-04-29 DIAGNOSIS — Z5181 Encounter for therapeutic drug level monitoring: Secondary | ICD-10-CM

## 2022-04-29 DIAGNOSIS — Z954 Presence of other heart-valve replacement: Secondary | ICD-10-CM | POA: Diagnosis not present

## 2022-04-29 DIAGNOSIS — I059 Rheumatic mitral valve disease, unspecified: Secondary | ICD-10-CM | POA: Diagnosis not present

## 2022-04-29 LAB — POCT INR: INR: 1.8 — AB (ref 2.0–3.0)

## 2022-04-29 NOTE — Patient Instructions (Signed)
Description   Today take 1.5 tablets of warfarin and tomorrow take 1.5 tablets of warfarin then continue taking Warfarin 1 tablet daily except 1/2 tablet on Saturdays. Recheck INR in 3 weeks. Call with any questions new medications or procedures  #[phone] or [phone]

## 2022-05-05 ENCOUNTER — Ambulatory Visit: Payer: Medicare HMO | Attending: Internal Medicine | Admitting: Pharmacist

## 2022-05-05 VITALS — BP 140/80 | HR 68

## 2022-05-05 DIAGNOSIS — I10 Essential (primary) hypertension: Secondary | ICD-10-CM

## 2022-05-05 DIAGNOSIS — E785 Hyperlipidemia, unspecified: Secondary | ICD-10-CM | POA: Diagnosis not present

## 2022-05-05 MED ORDER — AMLODIPINE BESYLATE 5 MG PO TABS: 5.0000 mg | ORAL_TABLET | Freq: Every day | ORAL | 3 refills | Status: DC

## 2022-05-05 MED ORDER — ROSUVASTATIN CALCIUM 5 MG PO TABS: 5.0000 mg | ORAL_TABLET | Freq: Every day | ORAL | 3 refills | Status: DC

## 2022-05-05 NOTE — Patient Instructions (Signed)
Start taking rosuvastatin '5mg'$  daily Continue ezetimibe '10mg'$  daily Start taking amlodipine '5mg'$  daily. You may take 2 of the 2.'5mg'$  tablets to equal '5mg'$   Your blood pressure goal is <130/80  To check your pressure at home you will need to:  1. Sit up in a chair, with feet flat on the floor and back supported. Do not cross your ankles or legs. 2. Rest your left arm so that the cuff is about heart level. If the cuff goes on your upper arm,  then just relax the arm on the table, arm of the chair or your lap. If you have a wrist cuff, we  suggest relaxing your wrist against your chest (think of it as Pledging the Flag with the  wrong arm).  3. Place the cuff snugly around your arm, about 1 inch above the crook of your elbow. The  cords should be inside the groove of your elbow.  4. Sit quietly, with the cuff in place, for about 5 minutes. After that 5 minutes press the power  button to start a reading. 5. Do not talk or move while the reading is taking place.  6. Record your readings on a sheet of paper. Although most cuffs have a memory, it is often  easier to see a pattern developing when the numbers are all in front of you.  7. You can repeat the reading after 1-3 minutes if it is recommended  Make sure your bladder is empty and you have not had caffeine or tobacco within the last 30 min  Always bring your blood pressure log with you to your appointments. If you have not brought your monitor in to be double checked for accuracy, please bring it to your next appointment.  You can find a list of validated (accurate) blood pressure cuffs at PopPath.it   Important lifestyle changes to control high blood pressure  Intervention  Effect on the BP  Lose extra pounds and watch your waistline Weight loss is one of the most effective lifestyle changes for controlling blood pressure. If you're overweight or obese, losing even a small amount of weight can help reduce blood pressure. Blood  pressure might go down by about 1 millimeter of mercury (mm Hg) with each kilogram (about 2.2 pounds) of weight lost.  Exercise regularly As a general goal, aim for at least 30 minutes of moderate physical activity every day. Regular physical activity can lower high blood pressure by about 5 to 8 mm Hg.  Eat a healthy diet Eating a diet rich in whole grains, fruits, vegetables, and low-fat dairy products and low in saturated fat and cholesterol. A healthy diet can lower high blood pressure by up to 11 mm Hg.  Reduce salt (sodium) in your diet Even a small reduction of sodium in the diet can improve heart health and reduce high blood pressure by about 5 to 6 mm Hg.  Limit alcohol One drink equals 12 ounces of beer, 5 ounces of wine, or 1.5 ounces of 80-proof liquor.  Limiting alcohol to less than one drink a day for women or two drinks a day for men can help lower blood pressure by about 4 mm Hg.   Please call me at [phone] with any questions.

## 2022-05-05 NOTE — Assessment & Plan Note (Signed)
Assessment: LDL-C is above goal less than 100, non-HDL C is above goal of less than 130 and ApoB is elevated above goal of 90 Triglycerides are also elevated at 210 We discussed diet in detail. suggested replacing asked such as crackers and chips with things with more protein and fat content such as nuts or apple and peanut butter.  Limit or avoid fried foods and minimize sweets. Encourage patient to increase her physical activity.  Suggested walking at a moderate pace on a daily basis.  Patient does not like to walk in the cold so suggested she walk back and forth in her house first thing in the morning before she gets too busy and forgets We did discuss her LP-IR score and how improvements in diet and increasing exercise can help this We discussed her ASCVD 10-year risk score, risk factors for ASCVD how we can improve these modifiable risk factors and discuss level of aggressive therapy. Reviewed all medication options with patient and their pros and cons. Patient has not tried any other statin other than atorvastatin.  Therefore I feel it is appropriate to rechallenge with another statin before moving onto a nonstatin option   Plan: After discussion with patient we will start rosuvastatin 5 mg daily Patient is aware that if she does not tolerate daily she should hold until her pain goes away and then she should resume at a lower frequency i.e. 3 times a week Continue ezetimibe 10 mg daily Recheck cholesterol labs in April.  Patient was given lab slip to get checked at [facility] when she comes for her Coumadin checks

## 2022-05-05 NOTE — Assessment & Plan Note (Signed)
Assessment: Blood pressure is above goal in clinic of less than 130/80 I did check both in her left and right arm.  I did not get a significant difference between the 2 as was seen previously I did advise patient to continue to check in her right arm Increase physical activity which will help improve blood pressure.  Set goal of walking 20 minutes daily or split at 10 minutes in the morning and 10 minutes at the afternoon  Plan: Increase amlodipine to 5 mg daily. Follow-up at [facility] office 2/14 since patient already has a Coumadin clinic appointment there and there was an open slot Reviewed proper technique for checking blood pressure with patient and provided her a handout Continue metoprolol 25 mg twice a day

## 2022-05-05 NOTE — Progress Notes (Signed)
Patient ID: Priscilla Stevens                 DOB: 03/08/1951                    MRN: [id number]      HPI: Priscilla Stevens is a 72 y.o. female patient referred to lipid clinic by Dr. Harrington Challenger. 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 No significant CAD on prior cath.  Patient is intolerant to statins, currently on ezetimibe.  Advanced lipid panel was checked at last appointment with Dr. Harrington Challenger.  Showed elevated LDL particle number, elevated APO B, elevated triglycerides, and high non-HDL cholesterol.  Likely LP(a) was normal. Her blood pressure was also found to be elevated at last office visit.  She had a nurse visit to validate her home cuff.  Home cuff was relatively accurate, however a large difference in blood pressure was found between her right and left arm.  Right arm was higher and therefore should be the arm that we use from now on.  Amlodipine 2.5 mg daily was added.  Patient presents to Pharm.D. clinic.  She reports only trying atorvastatin 10 mg daily which caused her leg pain at night.  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  She tolerates ezetimibe 10 mg daily fine.  Doing okay on amlodipine 2.5 mg daily has seen some reduction in her blood pressure.  States he generally has been 140/90.  She has been using her right arm as advised.  Reviewed options for lowering LDL cholesterol, including ezetimibe, PCSK-9 inhibitors, bempedoic acid and inclisiran.  Discussed mechanisms of action, dosing, side effects and potential decreases in LDL cholesterol.  Also reviewed cost information and potential options for patient assistance.   Current HLD Medications: ezetimibe '10mg'$  daily Current HTN Medications: Metoprolol succinate 25 mg twice a day, amlodipine 2.5 mg daily Intolerances: Atorvastatin 10 mg daily (leg pain) Risk Factors:  Hypertension, decreased insulin sensitivity, ASCVD 10-year risk score 14.1% LDL-C goal: <100 ApoB goal: <90  Diet:  Breakfast: breakfast bar-  Lunch: doesn't eat  Dinner: chicken, mashed potatoes and green beans- meat, sometimes starch and vegetable Snacks: fruit, oatmeal cake (sometimes) ice cream, cheese crackers  Drink: diet pepsi, black coffee, very little water Eats out twice a week- [place] (taco salad or tacos) or fish house (fried fish)  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    Social History: The patient  reports that she quit smoking about 8 years ago. Her smoking use included cigarettes. She has a 36.00 pack-year smoking history. She has never used smokeless tobacco. She reports no current alcohol use. She reports that she does not use drugs.    Labs: Lipid Panel 03/18/22 LDL-P 1446, LDL-C 117, ApoB 106, TG 210, HDL-C 43, LP-IR 65 LPa 26 non-HDL C154    Component Value Date/Time   CHOL 186 02/18/2021 1021   TRIG 228 (H) 02/18/2021 1021   HDL 43 02/18/2021 1021   CHOLHDL 4.3 02/18/2021 1021   CHOLHDL 3.1 07/08/2015 1510   VLDL 43 (H) 07/08/2015 1510   LDLCALC 104 (H) 02/18/2021 1021   LABVLDL 39 02/18/2021 1021    Past Medical History:  Diagnosis Date  Acute on chronic diastolic heart failure (HCC) 06/22/2012   Anxiety    CHF (congestive heart failure) (HCC)    Chronic diastolic congestive heart failure ([place]) 07/07/2012   CLL (chronic lymphocytic leukemia) (HCC)    Complication of anesthesia    Cough    Eval by pulm in past: essentially normal PFTs, cough ddx was variant asthma, upper airway cough syndrome (previously termed post nasal drip syndrome) or GERD,    Diverticulitis    Dysrhythmia    FAST HB TAKING METOPROLOL NOT FOR HTN   Fibromyalgia    GERD (gastroesophageal reflux disease)    Headache(784.0)    HX MIGRAINES     Heart murmur    History of echocardiogram    Echo 9/16:  Vigorous LVF, EF 65-70%, MVR ok (peak 12 mmHg, mean 6 mmHg), mild LAE, normal RVF.   History of tobacco abuse 06/22/2012   Mitral stenosis 06/20/2012   Morbid obesity ([place]) 06/20/2012   Pneumonia    2-3 YRS AGO    PONV (postoperative nausea and vomiting)    S/P mitral valve replacement with metallic valve 04/11/1094   29 mm Sorin [employer]omed[employer] mechanical valve   Severe mitral regurgitation 06/20/2012    Current Outpatient Medications on File Prior to Visit  Medication Sig Dispense Refill   albuterol (PROVENTIL HFA;VENTOLIN HFA) 108 (90 BASE) MCG/ACT inhaler Inhale 2 puffs into the lungs every 6 (six) hours as needed for wheezing or shortness of breath. 1 Inhaler 0   ALPRAZolam (XANAX) 0.25 MG tablet Take 1 tablet (0.25 mg total) by mouth at bedtime as needed for sleep or anxiety. 30 tablet 0   amoxicillin (AMOXIL) 500 MG tablet 4 tablets (2 G) 30-60 minutes prior to dental appointment. 4 tablet 6   calcium carbonate (OS-CAL) 600 MG TABS Take 600 mg by mouth 2 (two) times daily with a meal.      Cholecalciferol 25 MCG (1000 UT) tablet Take by mouth.     esomeprazole (NEXIUM) 20 MG capsule Take 40 mg by mouth daily.     ezetimibe (ZETIA) 10 MG tablet Take 10 mg by mouth daily.     furosemide (LASIX) 20 MG tablet Take 1 tablet (20 mg total) by mouth daily. 90 tablet 3   levocetirizine (XYZAL) 5 MG tablet Take 5 mg by mouth daily.     metoprolol succinate (TOPROL-XL) 25 MG 24 hr tablet Take 1 tablet (25 mg total) by mouth in the morning and at bedtime. 180 tablet 3   warfarin (COUMADIN) 5 MG tablet TAKE 1 TABLET BY MOUTH EVERY DAY OR AS DIRECTED BY ANTICOAGULATION CLINIC 100 tablet 1   No current facility-administered medications on file prior to visit.    Allergies  Allergen Reactions   Statins Other (See Comments)    Other reaction(s): Other Leg cramping Other reaction(s): Other Leg cramping   Aspirin Nausea Only    REACTION: GI  upset    Assessment/Plan:  1. Hyperlipidemia -  Hyperlipidemia Assessment: LDL-C is above goal less than 100, non-HDL C is above goal of less than 130 and ApoB is elevated above goal of 90 Triglycerides are also elevated at 210 We discussed diet in detail. suggested replacing asked such as crackers and chips with things with more protein and fat content such as nuts or apple and peanut butter.  Limit or avoid fried foods and minimize sweets. Encourage patient to increase her physical activity.  Suggested walking at a moderate pace on a daily basis.  Patient does not like  to walk in the cold so suggested she walk back and forth in her house first thing in the morning before she gets too busy and forgets We did discuss her LP-IR score and how improvements in diet and increasing exercise can help this We discussed her ASCVD 10-year risk score, risk factors for ASCVD how we can improve these modifiable risk factors and discuss level of aggressive therapy. Reviewed all medication options with patient and their pros and cons. Patient has not tried any other statin other than atorvastatin.  Therefore I feel it is appropriate to rechallenge with another statin before moving onto a nonstatin option   Plan: After discussion with patient we will start rosuvastatin 5 mg daily Patient is aware that if she does not tolerate daily she should hold until her pain goes away and then she should resume at a lower frequency i.e. 3 times a week Continue ezetimibe 10 mg daily Recheck cholesterol labs in April.  Patient was given lab slip to get checked at [facility] when she comes for her Coumadin checks   Hypertension Assessment: Blood pressure is above goal in clinic of less than 130/80 I did check both in her left and right arm.  I did not get a significant difference between the 2 as was seen previously I did advise patient to continue to check in her right arm Increase physical activity which will  help improve blood pressure.  Set goal of walking 20 minutes daily or split at 10 minutes in the morning and 10 minutes at the afternoon  Plan: Increase amlodipine to 5 mg daily. Follow-up at [facility] office 2/14 since patient already has a Coumadin clinic appointment there and there was an open slot Reviewed proper technique for checking blood pressure with patient and provided her a handout Continue metoprolol 25 mg twice a day  Thank you,  Ramond Dial, Pharm.D, BCPS, CPP [facility] of [facility] [address] [place], [place] [address]  Phone: [phone]; Fax: [phone]

## 2022-05-13 NOTE — Progress Notes (Signed)
Cardiology Office Note   Date:  05/13/2022   ID:  SHEBRA DALOISIO, DOB [birth year], MRN [id number]  PCP:  Chesley Noon, MD  Cardiologist:   Dorris Carnes, MD       History of Present Illness: Priscilla Stevens is a 72 y.o. female with a history of       No outpatient medications have been marked as taking for the 04/15/22 encounter (Clinical Support) with [facility] NURSE.     Allergies:   Statins and Aspirin   Past Medical History:  Diagnosis Date   Acute on chronic diastolic heart failure ([place]) 06/22/2012   Anxiety    CHF (congestive heart failure) (HCC)    Chronic diastolic congestive heart failure ([place]) 07/07/2012   CLL (chronic lymphocytic leukemia) (HCC)    Complication of anesthesia    Cough    Eval by pulm in past: essentially normal PFTs, cough ddx was variant asthma, upper airway cough syndrome (previously termed post nasal drip syndrome) or GERD,    Diverticulitis    Dysrhythmia    FAST HB TAKING METOPROLOL NOT FOR HTN   Fibromyalgia    GERD (gastroesophageal reflux disease)    Headache(784.0)    HX MIGRAINES    Heart murmur    History of echocardiogram    Echo 9/16:  Vigorous LVF, EF 65-70%, MVR ok (peak 12 mmHg, mean 6 mmHg), mild LAE, normal RVF.   History of tobacco abuse 06/22/2012   Mitral stenosis 06/20/2012   Morbid obesity ([place]) 06/20/2012   Pneumonia    2-3 YRS AGO    PONV (postoperative nausea and vomiting)    S/P mitral valve replacement with metallic valve [id number]   29 mm Sorin Carbomedics mechanical valve   Severe mitral regurgitation 06/20/2012    Past Surgical History:  Procedure Laterality Date   APPENDECTOMY     INTRAOPERATIVE TRANSESOPHAGEAL ECHOCARDIOGRAM N/A 07/07/2012   Procedure: INTRAOPERATIVE TRANSESOPHAGEAL ECHOCARDIOGRAM;  Surgeon: Rexene Alberts, MD;  Location: [facility];  Service: Open Heart Surgery;  Laterality: N/A;   LEFT AND RIGHT HEART CATHETERIZATION WITH CORONARY ANGIOGRAM N/A 06/21/2012   Procedure: LEFT AND  RIGHT HEART CATHETERIZATION WITH CORONARY ANGIOGRAM;  Surgeon: Larey Dresser, MD;  Location: [facility] CATH LAB;  Service: Cardiovascular;  Laterality: N/A;   MITRAL VALVE REPLACEMENT N/A 07/07/2012   Procedure: MITRAL VALVE (MV) REPLACEMENT;  Surgeon: Rexene Alberts, MD;  Location: [facility];  Service: Open Heart Surgery;  Laterality: N/A;   NO PAST SURGERIES     TUBAL PREG    TEE WITHOUT CARDIOVERSION N/A 06/22/2012   Procedure: TRANSESOPHAGEAL ECHOCARDIOGRAM (TEE);  Surgeon: Larey Dresser, MD;  Location: [facility];  Service: Cardiovascular;  Laterality: N/A;   TONSILLECTOMY     TOTAL VAGINAL HYSTERECTOMY       Social History:  The patient  reports that she quit smoking about 9 years ago. Her smoking use included cigarettes. She has a 36.00 pack-year smoking history. She has never used smokeless tobacco. She reports current alcohol use. She reports that she does not use drugs.   Family History:  The patient's family history includes Cancer - Lung in her father; Congestive Heart Failure in her mother.    ROS:  Please see the history of present illness. All other systems are reviewed and  Negative to the above problem except as noted.    PHYSICAL EXAM: VS:  BP 118/73 (BP Location: Left Arm) Comment: LT Arm Home Cuff Comment (BP Location): Lt Arm Home Cuff  Pulse 80   Resp 20   Wt 182 lb 0.4 oz (82.6 kg)   BMI 30.29 kg/m   GEN: Well nourished, well developed, in no acute distress  HEENT: normal  Neck: no JVD, carotid bruits, or masses Cardiac: RRR; no murmurs, rubs, or gallops,no edema  Respiratory:  clear to auscultation bilaterally, normal work of breathing GI: soft, nontender, nondistended, + BS  No hepatomegaly  MS: no deformity Moving all extremities   Skin: warm and dry, no rash Neuro:  Strength and sensation are intact Psych: euthymic mood, full affect   EKG:  EKG is ordered today.   Lipid Panel    Component Value Date/Time   CHOL 186 02/18/2021 1021   TRIG 228 (H)  02/18/2021 1021   HDL 43 02/18/2021 1021   CHOLHDL 4.3 02/18/2021 1021   CHOLHDL 3.1 07/08/2015 1510   VLDL 43 (H) 07/08/2015 1510   LDLCALC 104 (H) 02/18/2021 1021      Wt Readings from Last 3 Encounters:  04/15/22 182 lb 0.4 oz (82.6 kg)  03/18/22 181 lb 9.6 oz (82.4 kg)  02/18/21 182 lb 12.8 oz (82.9 kg)      ASSESSMENT AND PLAN:     Current medicines are reviewed at length with the patient today.  The patient does not have concerns regarding medicines.  Signed, Dorris Carnes, MD  05/13/2022 7:47 AM    [facility] [address], [place], [place]  [address] Phone: [phone]; Fax: [phone]     Cardiology Office Note   Date:  05/13/2022   ID:  SHERION KECKLER, DOB [birth year], MRN [id number]  PCP:  Chesley Noon, MD  Cardiologist:   Dorris Carnes, MD       History of Present Illness: Priscilla Stevens is a 72 y.o. female with a history of       No outpatient medications have been marked as taking for the 04/15/22 encounter (Clinical Support) with [facility] NURSE.     Allergies:   Statins and Aspirin   Past Medical History:  Diagnosis Date   Acute on chronic diastolic heart failure ([place]) 06/22/2012   Anxiety    CHF (congestive heart failure) (HCC)    Chronic diastolic congestive heart failure ([place]) 07/07/2012   CLL (chronic lymphocytic leukemia) (HCC)    Complication of anesthesia    Cough    Eval by pulm in past: essentially normal PFTs, cough ddx was variant asthma, upper airway cough syndrome (previously termed post nasal drip syndrome) or GERD,    Diverticulitis    Dysrhythmia    FAST HB TAKING METOPROLOL NOT FOR HTN   Fibromyalgia    GERD (gastroesophageal reflux disease)    Headache(784.0)    HX MIGRAINES    Heart murmur    History of echocardiogram    Echo 9/16:  Vigorous LVF, EF 65-70%, MVR ok (peak 12 mmHg, mean 6 mmHg), mild LAE, normal RVF.   History of tobacco abuse 06/22/2012   Mitral stenosis 06/20/2012    Morbid obesity ([place]) 06/20/2012   Pneumonia    2-3 YRS AGO    PONV (postoperative nausea and vomiting)    S/P mitral valve replacement with metallic valve [id number]   29 mm Sorin Carbomedics mechanical valve   Severe mitral regurgitation 06/20/2012    Past Surgical History:  Procedure Laterality Date   APPENDECTOMY     INTRAOPERATIVE TRANSESOPHAGEAL ECHOCARDIOGRAM N/A 07/07/2012   Procedure: INTRAOPERATIVE TRANSESOPHAGEAL ECHOCARDIOGRAM;  Surgeon: Rexene Alberts, MD;  [facility];  Service: Open Heart Surgery;  Laterality: N/A;   LEFT AND RIGHT HEART CATHETERIZATION WITH CORONARY ANGIOGRAM N/A 06/21/2012   Procedure: LEFT AND RIGHT HEART CATHETERIZATION WITH CORONARY ANGIOGRAM;  Surgeon: Larey Dresser, MD;  Location: [facility] CATH LAB;  Service: Cardiovascular;  Laterality: N/A;   MITRAL VALVE REPLACEMENT N/A 07/07/2012   Procedure: MITRAL VALVE (MV) REPLACEMENT;  Surgeon: Rexene Alberts, MD;  Location: [facility];  Service: Open Heart Surgery;  Laterality: N/A;   NO PAST SURGERIES     TUBAL PREG    TEE WITHOUT CARDIOVERSION N/A 06/22/2012   Procedure: TRANSESOPHAGEAL ECHOCARDIOGRAM (TEE);  Surgeon: Larey Dresser, MD;  Location: [facility];  Service: Cardiovascular;  Laterality: N/A;   TONSILLECTOMY     TOTAL VAGINAL HYSTERECTOMY       Social History:  The patient  reports that she quit smoking about 9 years ago. Her smoking use included cigarettes. She has a 36.00 pack-year smoking history. She has never used smokeless tobacco. She reports current alcohol use. She reports that she does not use drugs.   Family History:  The patient's family history includes Cancer - Lung in her father; Congestive Heart Failure in her mother.    ROS:  Please see the history of present illness. All other systems are reviewed and  Negative to the above problem except as noted.    PHYSICAL EXAM: VS:  BP 118/73 (BP Location: Left Arm) Comment: LT Arm Home Cuff Comment (BP Location): Lt Arm Home Cuff  Pulse  80   Resp 20   Wt 182 lb 0.4 oz (82.6 kg)   BMI 30.29 kg/m   GEN: Well nourished, well developed, in no acute distress  HEENT: normal  Neck: no JVD, carotid bruits, or masses Cardiac: RRR; no murmurs, rubs, or gallops,no edema  Respiratory:  clear to auscultation bilaterally, normal work of breathing GI: soft, nontender, nondistended, + BS  No hepatomegaly  MS: no deformity Moving all extremities   Skin: warm and dry, no rash Neuro:  Strength and sensation are intact Psych: euthymic mood, full affect   EKG:  EKG is ordered today.   Lipid Panel    Component Value Date/Time   CHOL 186 02/18/2021 1021   TRIG 228 (H) 02/18/2021 1021   HDL 43 02/18/2021 1021   CHOLHDL 4.3 02/18/2021 1021   CHOLHDL 3.1 07/08/2015 1510   VLDL 43 (H) 07/08/2015 1510   LDLCALC 104 (H) 02/18/2021 1021      Wt Readings from Last 3 Encounters:  04/15/22 182 lb 0.4 oz (82.6 kg)  03/18/22 181 lb 9.6 oz (82.4 kg)  02/18/21 182 lb 12.8 oz (82.9 kg)      ASSESSMENT AND PLAN:     Current medicines are reviewed at length with the patient today.  The patient does not have concerns regarding medicines.  Signed, Dorris Carnes, MD  05/13/2022 7:47 AM    [address], [place], [place]  [address] Phone: [phone]; Fax: [phone]     Cardiology Office Note   Date:  05/13/2022   ID:  MAKYNLI VASICEK, DOB [birth year], MRN [id number]  PCP:  Chesley Noon, MD  Cardiologist:   Dorris Carnes, MD       History of Present Illness: Priscilla Stevens is a 72 y.o. female with a history of       No outpatient medications have been marked as taking for the 04/15/22 encounter (Clinical Support) with [facility]  NURSE.     Allergies:   Statins and Aspirin   Past Medical History:  Diagnosis Date   Acute on chronic diastolic heart failure ([place]) 06/22/2012   Anxiety    CHF (congestive heart failure) (HCC)    Chronic diastolic congestive heart failure  ([place]) 07/07/2012   CLL (chronic lymphocytic leukemia) (HCC)    Complication of anesthesia    Cough    Eval by pulm in past: essentially normal PFTs, cough ddx was variant asthma, upper airway cough syndrome (previously termed post nasal drip syndrome) or GERD,    Diverticulitis    Dysrhythmia    FAST HB TAKING METOPROLOL NOT FOR HTN   Fibromyalgia    GERD (gastroesophageal reflux disease)    Headache(784.0)    HX MIGRAINES    Heart murmur    History of echocardiogram    Echo 9/16:  Vigorous LVF, EF 65-70%, MVR ok (peak 12 mmHg, mean 6 mmHg), mild LAE, normal RVF.   History of tobacco abuse 06/22/2012   Mitral stenosis 06/20/2012   Morbid obesity ([place]) 06/20/2012   Pneumonia    2-3 YRS AGO    PONV (postoperative nausea and vomiting)    S/P mitral valve replacement with metallic valve [id number]   29 mm Sorin Carbomedics mechanical valve   Severe mitral regurgitation 06/20/2012    Past Surgical History:  Procedure Laterality Date   APPENDECTOMY     INTRAOPERATIVE TRANSESOPHAGEAL ECHOCARDIOGRAM N/A 07/07/2012   Procedure: INTRAOPERATIVE TRANSESOPHAGEAL ECHOCARDIOGRAM;  Surgeon: Rexene Alberts, MD;  Location: [facility];  Service: Open Heart Surgery;  Laterality: N/A;   LEFT AND RIGHT HEART CATHETERIZATION WITH CORONARY ANGIOGRAM N/A 06/21/2012   Procedure: LEFT AND RIGHT HEART CATHETERIZATION WITH CORONARY ANGIOGRAM;  Surgeon: Larey Dresser, MD;  Location[facility] CATH LAB;  Service: Cardiovascular;  Laterality: N/A;   MITRAL VALVE REPLACEMENT N/A 07/07/2012   Procedure: MITRAL VALVE (MV) REPLACEMENT;  Surgeon: Rexene Alberts, MD;  Location: [facility];  Service: Open Heart Surgery;  Laterality: N/A;   NO PAST SURGERIES     TUBAL PREG    TEE WITHOUT CARDIOVERSION N/A 06/22/2012   Procedure: TRANSESOPHAGEAL ECHOCARDIOGRAM (TEE);  Surgeon: Larey Dresser, MD;  Location: [facility];  Service: Cardiovascular;  Laterality: N/A;   TONSILLECTOMY     TOTAL VAGINAL HYSTERECTOMY       Social History:  The  patient  reports that she quit smoking about 9 years ago. Her smoking use included cigarettes. She has a 36.00 pack-year smoking history. She has never used smokeless tobacco. She reports current alcohol use. She reports that she does not use drugs.   Family History:  The patient's family history includes Cancer - Lung in her father; Congestive Heart Failure in her mother.    ROS:  Please see the history of present illness. All other systems are reviewed and  Negative to the above problem except as noted.    PHYSICAL EXAM: VS:  BP 118/73 (BP Location: Left Arm) Comment: LT Arm Home Cuff Comment (BP Location): Lt Arm Home Cuff  Pulse 80   Resp 20   Wt 182 lb 0.4 oz (82.6 kg)   BMI 30.29 kg/m   GEN: Well nourished, well developed, in no acute distress  HEENT: normal  Neck: no JVD, carotid bruits, or masses Cardiac: RRR; no murmurs, rubs, or gallops,no edema  Respiratory:  clear to auscultation bilaterally, normal work of breathing GI: soft, nontender, nondistended, + BS  No hepatomegaly  MS: no deformity Moving all extremities  Skin: warm and dry, no rash Neuro:  Strength and sensation are intact Psych: euthymic mood, full affect   EKG:  EKG is ordered today.   Lipid Panel    Component Value Date/Time   CHOL 186 02/18/2021 1021   TRIG 228 (H) 02/18/2021 1021   HDL 43 02/18/2021 1021   CHOLHDL 4.3 02/18/2021 1021   CHOLHDL 3.1 07/08/2015 1510   VLDL 43 (H) 07/08/2015 1510   LDLCALC 104 (H) 02/18/2021 1021      Wt Readings from Last 3 Encounters:  04/15/22 182 lb 0.4 oz (82.6 kg)  03/18/22 181 lb 9.6 oz (82.4 kg)  02/18/21 182 lb 12.8 oz (82.9 kg)      ASSESSMENT AND PLAN:     Current medicines are reviewed at length with the patient today.  The patient does not have concerns regarding medicines.  Signed, Dorris Carnes, MD  05/13/2022 7:47 AM    [address], [place], [place]  [address] Phone: [phone]; Fax: [phone]      Cardiology Office Note   Date:  05/13/2022   ID:  KIANNAH MENTH, DOB 04/19/1950, MRN [id number]  PCP:  Chesley Noon, MD  Cardiologist:   Dorris Carnes, MD       History of Present Illness: Priscilla Stevens is a 72 y.o. female with a history of       No outpatient medications have been marked as taking for the 04/15/22 encounter (Clinical Support) with [facility] NURSE.     Allergies:   Statins and Aspirin   Past Medical History:  Diagnosis Date   Acute on chronic diastolic heart failure ([place]) 06/22/2012   Anxiety    CHF (congestive heart failure) (HCC)    Chronic diastolic congestive heart failure ([place]) 07/07/2012   CLL (chronic lymphocytic leukemia) (HCC)    Complication of anesthesia    Cough    Eval by pulm in past: essentially normal PFTs, cough ddx was variant asthma, upper airway cough syndrome (previously termed post nasal drip syndrome) or GERD,    Diverticulitis    Dysrhythmia    FAST HB TAKING METOPROLOL NOT FOR HTN   Fibromyalgia    GERD (gastroesophageal reflux disease)    Headache(784.0)    HX MIGRAINES    Heart murmur    History of echocardiogram    Echo 9/16:  Vigorous LVF, EF 65-70%, MVR ok (peak 12 mmHg, mean 6 mmHg), mild LAE, normal RVF.   History of tobacco abuse 06/22/2012   Mitral stenosis 06/20/2012   Morbid obesity ([place]) 06/20/2012   Pneumonia    2-3 YRS AGO    PONV (postoperative nausea and vomiting)    S/P mitral valve replacement with metallic valve [id number]   29 mm Sorin Carbomedics mechanical valve   Severe mitral regurgitation 06/20/2012    Past Surgical History:  Procedure Laterality Date   APPENDECTOMY     INTRAOPERATIVE TRANSESOPHAGEAL ECHOCARDIOGRAM N/A 07/07/2012   Procedure: INTRAOPERATIVE TRANSESOPHAGEAL ECHOCARDIOGRAM;  Surgeon: Rexene Alberts, MD;  Location: [facility];  Service: Open Heart Surgery;  Laterality: N/A;   LEFT AND RIGHT HEART CATHETERIZATION WITH CORONARY ANGIOGRAM N/A 06/21/2012   Procedure: LEFT AND  RIGHT HEART CATHETERIZATION WITH CORONARY ANGIOGRAM;  Surgeon: Larey Dresser, MD;  Location: [facility] CATH LAB;  Service: Cardiovascular;  Laterality: N/A;   MITRAL VALVE REPLACEMENT N/A 07/07/2012   Procedure: MITRAL VALVE (MV) REPLACEMENT;  Surgeon: Rexene Alberts, MD;  Location: [facility];  Service: Open Heart  Surgery;  Laterality: N/A;   NO PAST SURGERIES     TUBAL PREG    TEE WITHOUT CARDIOVERSION N/A 06/22/2012   Procedure: TRANSESOPHAGEAL ECHOCARDIOGRAM (TEE);  Surgeon: Larey Dresser, MD;  Location: [facility];  Service: Cardiovascular;  Laterality: N/A;   TONSILLECTOMY     TOTAL VAGINAL HYSTERECTOMY       Social History:  The patient  reports that she quit smoking about 9 years ago. Her smoking use included cigarettes. She has a 36.00 pack-year smoking history. She has never used smokeless tobacco. She reports current alcohol use. She reports that she does not use drugs.   Family History:  The patient's family history includes Cancer - Lung in her father; Congestive Heart Failure in her mother.    ROS:  Please see the history of present illness. All other systems are reviewed and  Negative to the above problem except as noted.    PHYSICAL EXAM: VS:  BP 118/73 (BP Location: Left Arm) Comment: LT Arm Home Cuff Comment (BP Location): Lt Arm Home Cuff  Pulse 80   Resp 20   Wt 182 lb 0.4 oz (82.6 kg)   BMI 30.29 kg/m   GEN: Well nourished, well developed, in no acute distress  HEENT: normal  Neck: no JVD, carotid bruits, or masses Cardiac: RRR; no murmurs, rubs, or gallops,no edema  Respiratory:  clear to auscultation bilaterally, normal work of breathing GI: soft, nontender, nondistended, + BS  No hepatomegaly  MS: no deformity Moving all extremities   Skin: warm and dry, no rash Neuro:  Strength and sensation are intact Psych: euthymic mood, full affect   EKG:  EKG is ordered today.   Lipid Panel    Component Value Date/Time   CHOL 186 02/18/2021 1021   TRIG 228 (H)  02/18/2021 1021   HDL 43 02/18/2021 1021   CHOLHDL 4.3 02/18/2021 1021   CHOLHDL 3.1 07/08/2015 1510   VLDL 43 (H) 07/08/2015 1510   LDLCALC 104 (H) 02/18/2021 1021      Wt Readings from Last 3 Encounters:  04/15/22 182 lb 0.4 oz (82.6 kg)  03/18/22 181 lb 9.6 oz (82.4 kg)  02/18/21 182 lb 12.8 oz (82.9 kg)      ASSESSMENT AND PLAN:     Current medicines are reviewed at length with the patient today.  The patient does not have concerns regarding medicines.  Signed, Dorris Carnes, MD  05/13/2022 7:47 AM    [address], [place], [place]  [address] Phone: [phone]; Fax: [phone]

## 2022-05-13 NOTE — Progress Notes (Deleted)
Cardiology Office Note   Date:  05/13/2022   ID:  Priscilla Stevens, DOB [birth year], MRN [id number]  PCP:  Priscilla Noon, MD  Cardiologist:   Priscilla Carnes, MD       History of Present Illness: Priscilla Stevens is a 72 y.o. female with a history of       No outpatient medications have been marked as taking for the 04/15/22 encounter (Clinical Support) with [facility] NURSE.     Allergies:   Statins and Aspirin   Past Medical History:  Diagnosis Date   Acute on chronic diastolic heart failure ([place]) 06/22/2012   Anxiety    CHF (congestive heart failure) (HCC)    Chronic diastolic congestive heart failure ([place]) 07/07/2012   CLL (chronic lymphocytic leukemia) (HCC)    Complication of anesthesia    Cough    Eval by pulm in past: essentially normal PFTs, cough ddx was variant asthma, upper airway cough syndrome (previously termed post nasal drip syndrome) or GERD,    Diverticulitis    Dysrhythmia    FAST HB TAKING METOPROLOL NOT FOR HTN   Fibromyalgia    GERD (gastroesophageal reflux disease)    Headache(784.0)    HX MIGRAINES    Heart murmur    History of echocardiogram    Echo 9/16:  Vigorous LVF, EF 65-70%, MVR ok (peak 12 mmHg, mean 6 mmHg), mild LAE, normal RVF.   History of tobacco abuse 06/22/2012   Mitral stenosis 06/20/2012   Morbid obesity ([place]) 06/20/2012   Pneumonia    2-3 YRS AGO    PONV (postoperative nausea and vomiting)    S/P mitral valve replacement with metallic valve [id number]   29 mm Sorin Carbomedics mechanical valve   Severe mitral regurgitation 06/20/2012    Past Surgical History:  Procedure Laterality Date   APPENDECTOMY     INTRAOPERATIVE TRANSESOPHAGEAL ECHOCARDIOGRAM N/A 07/07/2012   Procedure: INTRAOPERATIVE TRANSESOPHAGEAL ECHOCARDIOGRAM;  Surgeon: Priscilla Alberts, MD;  Location: [facility];  Service: Open Heart Surgery;  Laterality: N/A;   LEFT AND RIGHT HEART CATHETERIZATION WITH CORONARY ANGIOGRAM N/A 06/21/2012   Procedure: LEFT AND  RIGHT HEART CATHETERIZATION WITH CORONARY ANGIOGRAM;  Surgeon: Priscilla Dresser, MD;  Location: [facility] CATH LAB;  Service: Cardiovascular;  Laterality: N/A;   MITRAL VALVE REPLACEMENT N/A 07/07/2012   Procedure: MITRAL VALVE (MV) REPLACEMENT;  Surgeon: Priscilla Alberts, MD;  Location: [facility];  Service: Open Heart Surgery;  Laterality: N/A;   NO PAST SURGERIES     TUBAL PREG    TEE WITHOUT CARDIOVERSION N/A 06/22/2012   Procedure: TRANSESOPHAGEAL ECHOCARDIOGRAM (TEE);  Surgeon: Priscilla Dresser, MD;  Location: [facility];  Service: Cardiovascular;  Laterality: N/A;   TONSILLECTOMY     TOTAL VAGINAL HYSTERECTOMY       Social History:  The patient  reports that she quit smoking about 9 years ago. Her smoking use included cigarettes. She has a 36.00 pack-year smoking history. She has never used smokeless tobacco. She reports current alcohol use. She reports that she does not use drugs.   Family History:  The patient's family history includes Cancer - Lung in her father; Congestive Heart Failure in her mother.    ROS:  Please see the history of present illness. All other systems are reviewed and  Negative to the above problem except as noted.    PHYSICAL EXAM: VS:  BP 118/73 (BP Location: Left Arm) Comment: LT Arm Home Cuff Comment (BP Location): Lt Arm Home Cuff  Pulse 80   Resp 20   Wt 182 lb 0.4 oz (82.6 kg)   BMI 30.29 kg/m   GEN: Well nourished, well developed, in no acute distress  HEENT: normal  Neck: no JVD, carotid bruits, or masses Cardiac: RRR; no murmurs, rubs, or gallops,no edema  Respiratory:  clear to auscultation bilaterally, normal work of breathing GI: soft, nontender, nondistended, + BS  No hepatomegaly  MS: no deformity Moving all extremities   Skin: warm and dry, no rash Neuro:  Strength and sensation are intact Psych: euthymic mood, full affect   EKG:  EKG is ordered today.   Lipid Panel    Component Value Date/Time   CHOL 186 02/18/2021 1021   TRIG 228 (H)  02/18/2021 1021   HDL 43 02/18/2021 1021   CHOLHDL 4.3 02/18/2021 1021   CHOLHDL 3.1 07/08/2015 1510   VLDL 43 (H) 07/08/2015 1510   LDLCALC 104 (H) 02/18/2021 1021      Wt Readings from Last 3 Encounters:  04/15/22 182 lb 0.4 oz (82.6 kg)  03/18/22 181 lb 9.6 oz (82.4 kg)  02/18/21 182 lb 12.8 oz (82.9 kg)      ASSESSMENT AND PLAN:     Current medicines are reviewed at length with the patient today.  The patient does not have concerns regarding medicines.  Signed, Priscilla Carnes, MD  05/13/2022 7:46 AM    [address], [place], [place]  [address] Phone: [phone]; Fax: [phone]     Cardiology Office Note   Date:  05/13/2022   ID:  Priscilla Stevens, DOB November 05, 1950, MRN [id number]  PCP:  Priscilla Noon, MD  Cardiologist:   Priscilla Carnes, MD       History of Present Illness: Priscilla Stevens is a 72 y.o. female with a history of       No outpatient medications have been marked as taking for the 04/15/22 encounter (Clinical Support) with [facility] NURSE.     Allergies:   Statins and Aspirin   Past Medical History:  Diagnosis Date   Acute on chronic diastolic heart failure ([place]) 06/22/2012   Anxiety    CHF (congestive heart failure) (HCC)    Chronic diastolic congestive heart failure ([place]) 07/07/2012   CLL (chronic lymphocytic leukemia) (HCC)    Complication of anesthesia    Cough    Eval by pulm in past: essentially normal PFTs, cough ddx was variant asthma, upper airway cough syndrome (previously termed post nasal drip syndrome) or GERD,    Diverticulitis    Dysrhythmia    FAST HB TAKING METOPROLOL NOT FOR HTN   Fibromyalgia    GERD (gastroesophageal reflux disease)    Headache(784.0)    HX MIGRAINES    Heart murmur    History of echocardiogram    Echo 9/16:  Vigorous LVF, EF 65-70%, MVR ok (peak 12 mmHg, mean 6 mmHg), mild LAE, normal RVF.   History of tobacco abuse 06/22/2012   Mitral stenosis 06/20/2012    Morbid obesity ([place]) 06/20/2012   Pneumonia    2-3 YRS AGO    PONV (postoperative nausea and vomiting)    S/P mitral valve replacement with metallic valve 05/15/7679   29 mm Sorin Carbomedics mechanical valve   Severe mitral regurgitation 06/20/2012    Past Surgical History:  Procedure Laterality Date   APPENDECTOMY     INTRAOPERATIVE TRANSESOPHAGEAL ECHOCARDIOGRAM N/A 07/07/2012   Procedure: INTRAOPERATIVE TRANSESOPHAGEAL ECHOCARDIOGRAM;  Surgeon: Priscilla Alberts, MD;  Location: [facility];  Service: Open Heart Surgery;  Laterality: N/A;   LEFT AND RIGHT HEART CATHETERIZATION WITH CORONARY ANGIOGRAM N/A 06/21/2012   Procedure: LEFT AND RIGHT HEART CATHETERIZATION WITH CORONARY ANGIOGRAM;  Surgeon: Priscilla Dresser, MD;  Location: [facility] CATH LAB;  Service: Cardiovascular;  Laterality: N/A;   MITRAL VALVE REPLACEMENT N/A 07/07/2012   Procedure: MITRAL VALVE (MV) REPLACEMENT;  Surgeon: Priscilla Alberts, MD;  Location: [facility];  Service: Open Heart Surgery;  Laterality: N/A;   NO PAST SURGERIES     TUBAL PREG    TEE WITHOUT CARDIOVERSION N/A 06/22/2012   Procedure: TRANSESOPHAGEAL ECHOCARDIOGRAM (TEE);  Surgeon: Priscilla Dresser, MD;  Location: [facility];  Service: Cardiovascular;  Laterality: N/A;   TONSILLECTOMY     TOTAL VAGINAL HYSTERECTOMY       Social History:  The patient  reports that she quit smoking about 9 years ago. Her smoking use included cigarettes. She has a 36.00 pack-year smoking history. She has never used smokeless tobacco. She reports current alcohol use. She reports that she does not use drugs.   Family History:  The patient's family history includes Cancer - Lung in her father; Congestive Heart Failure in her mother.    ROS:  Please see the history of present illness. All other systems are reviewed and  Negative to the above problem except as noted.    PHYSICAL EXAM: VS:  BP 118/73 (BP Location: Left Arm) Comment: LT Arm Home Cuff Comment (BP Location): Lt Arm Home Cuff  Pulse  80   Resp 20   Wt 182 lb 0.4 oz (82.6 kg)   BMI 30.29 kg/m   GEN: Well nourished, well developed, in no acute distress  HEENT: normal  Neck: no JVD, carotid bruits, or masses Cardiac: RRR; no murmurs, rubs, or gallops,no edema  Respiratory:  clear to auscultation bilaterally, normal work of breathing GI: soft, nontender, nondistended, + BS  No hepatomegaly  MS: no deformity Moving all extremities   Skin: warm and dry, no rash Neuro:  Strength and sensation are intact Psych: euthymic mood, full affect   EKG:  EKG is ordered today.   Lipid Panel    Component Value Date/Time   CHOL 186 02/18/2021 1021   TRIG 228 (H) 02/18/2021 1021   HDL 43 02/18/2021 1021   CHOLHDL 4.3 02/18/2021 1021   CHOLHDL 3.1 07/08/2015 1510   VLDL 43 (H) 07/08/2015 1510   LDLCALC 104 (H) 02/18/2021 1021      Wt Readings from Last 3 Encounters:  04/15/22 182 lb 0.4 oz (82.6 kg)  03/18/22 181 lb 9.6 oz (82.4 kg)  02/18/21 182 lb 12.8 oz (82.9 kg)      ASSESSMENT AND PLAN:     Current medicines are reviewed at length with the patient today.  The patient does not have concerns regarding medicines.  Signed, Priscilla Carnes, MD  05/13/2022 7:45 AM    [address], [place],   [address] Phone: [phone]; Fax: [phone]

## 2022-05-20 ENCOUNTER — Ambulatory Visit (INDEPENDENT_AMBULATORY_CARE_PROVIDER_SITE_OTHER): Payer: Medicare HMO | Admitting: Student

## 2022-05-20 ENCOUNTER — Ambulatory Visit: Payer: Medicare HMO | Attending: Internal Medicine | Admitting: *Deleted

## 2022-05-20 VITALS — BP 126/78 | HR 74

## 2022-05-20 DIAGNOSIS — I10 Essential (primary) hypertension: Secondary | ICD-10-CM | POA: Diagnosis not present

## 2022-05-20 DIAGNOSIS — I059 Rheumatic mitral valve disease, unspecified: Secondary | ICD-10-CM

## 2022-05-20 DIAGNOSIS — Z954 Presence of other heart-valve replacement: Secondary | ICD-10-CM

## 2022-05-20 DIAGNOSIS — Z5181 Encounter for therapeutic drug level monitoring: Secondary | ICD-10-CM

## 2022-05-20 LAB — POCT INR: INR: 3.1 — AB (ref 2.0–3.0)

## 2022-05-20 NOTE — Assessment & Plan Note (Signed)
Assessment: BP is controlled (goal <130/80)in office BP 126/78 with heart rate 74 Takes amlodipine and metoprolol regularly and tolerates them well  Denies SOB, palpitation, chest pain, headaches,or swelling Home BP ~140/80; patient reports she is going through some stressful situation at home   Plan:  Given at goal BP in the office will not make any changes to the current medications  Reiterated the importance of regular exercise and low salt diet  Continue taking amlodipine 5 mg and metoprolol Xl 25 mg twice daily  Bring in home BP monitor at the next visit for validation  Patient to keep record of BP readings with heart rate and report to [place] at the next visit Patient to see PharmD in 4 weeks for follow up  Follow up lab(s) : none

## 2022-05-20 NOTE — Patient Instructions (Signed)
Description   Continue taking Warfarin 1 tablet daily except 1/2 tablet on Saturdays. Recheck INR in 4 weeks. Call with any questions new medications or procedures  #[phone] or [phone]

## 2022-05-20 NOTE — Patient Instructions (Signed)
No changes made by your pharmacist Cammy Copa, PharmD at today's visit:     Bring all of your meds, your BP cuff and your record of home blood pressures to your next appointment.    HOW TO TAKE YOUR BLOOD PRESSURE AT HOME  Rest 5 minutes before taking your blood pressure.  Don't smoke or drink caffeinated beverages for at least 30 minutes before. Take your blood pressure before (not after) you eat. Sit comfortably with your back supported and both feet on the floor (don't cross your legs). Elevate your arm to heart level on a table or a desk. Use the proper sized cuff. It should fit smoothly and snugly around your bare upper arm. There should be enough room to slip a fingertip under the cuff. The bottom edge of the cuff should be 1 inch above the crease of the elbow. Ideally, take 3 measurements at one sitting and record the average.  Important lifestyle changes to control high blood pressure  Intervention  Effect on the BP  Lose extra pounds and watch your waistline Weight loss is one of the most effective lifestyle changes for controlling blood pressure. If you're overweight or obese, losing even a small amount of weight can help reduce blood pressure. Blood pressure might go down by about 1 millimeter of mercury (mm Hg) with each kilogram (about 2.2 pounds) of weight lost.  Exercise regularly As a general goal, aim for at least 30 minutes of moderate physical activity every day. Regular physical activity can lower high blood pressure by about 5 to 8 mm Hg.  Eat a healthy diet Eating a diet rich in whole grains, fruits, vegetables, and low-fat dairy products and low in saturated fat and cholesterol. A healthy diet can lower high blood pressure by up to 11 mm Hg.  Reduce salt (sodium) in your diet Even a small reduction of sodium in the diet can improve heart health and reduce high blood pressure by about 5 to 6 mm Hg.  Limit alcohol One drink equals 12 ounces of beer, 5 ounces of  wine, or 1.5 ounces of 80-proof liquor.  Limiting alcohol to less than one drink a day for women or two drinks a day for men can help lower blood pressure by about 4 mm Hg.   If you have any questions or concerns please use My Chart to send questions or call the office at [phone]

## 2022-05-20 NOTE — Progress Notes (Signed)
Patient ID: Priscilla Stevens                 DOB: Jun 16, 1950                      MRN: [id number]      HPI: [name] is a 72 y.o. female patient referred to lipid clinic by Dr. Harrington Challenger. 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 No significant CAD on prior cath.  Patient is intolerant to statins, currently on ezetimibe.  Advanced lipid panel was checked at last appointment with Dr. Harrington Challenger.  Showed elevated LDL particle number, elevated APO B, elevated triglycerides, and high non-HDL cholesterol.  Likely LP(a) was normal. Her blood pressure was also found to be elevated at last office visit.  She had a nurse visit to validate her home cuff.  Home cuff was relatively accurate, however a large difference in blood pressure was found between her right and left arm.  Right arm was higher and therefore should be the arm that we use from now on.  Amlodipine 2.5 mg daily was added. At last visit with PharmD rosuvastatin 5 mg  was added to ezetimibe 10 mg and amlodipine dose was increased to 5 mg.  Patient presented today for BP follow up. Reports her BP at home stays in 140/80 range she has been going through lots of stress lately. She started eating better and started doing regular exercise since she saw [name]. She has lost almost 5 lbs. Her one one BP cuff has been validated but other one is not. Assess her steps to check BP at home she follows all steps correctly except she does not rest 2-5 min before checking her BP.    Current HTN Medications: Metoprolol succinate 25 mg twice a day, amlodipine 5 mg daily BP Goal: <130/80   Family History: Mother- HF and Father-Lung Cancer   Social History:   quit smoking 8 years ago No recreational drug use No alcohol use  Diet: she does not no fried and no sweets since last visit   Exercise: started walking 30 min every day around her house, lost  about 5 lb   Home BP readings: ~140/80  Last lipid panel: 03/18/22 LDL-P 1446, LDL-C 117, ApoB 106, TG 210, HDL-C 43, LP-IR 65 LPa 26 non-HDL C154  Lab Results  Component Value Date   CHOL 186 02/18/2021   HDL 43 02/18/2021   LDLCALC 104 (H) 02/18/2021   TRIG 228 (H) 02/18/2021   CHOLHDL 4.3 02/18/2021     Wt Readings from Last 3 Encounters:  04/15/22 182 lb 0.4 oz (82.6 kg)  03/18/22 181 lb 9.6 oz (82.4 kg)  02/18/21 182 lb 12.8 oz (82.9 kg)   BP Readings from Last 3 Encounters:  05/20/22 126/78  05/05/22 (!) 140/80  04/15/22 118/73   Pulse Readings from Last 3 Encounters:  05/20/22 74  05/05/22 68  04/15/22 80    Renal function: CrCl cannot be calculated (Patient's most recent lab result is older than the maximum 21 days allowed.).  Past Medical History:  Diagnosis Date   Acute on chronic diastolic heart failure ([place]) 06/22/2012   Anxiety    CHF (congestive heart failure) (HCC)    Chronic diastolic congestive heart failure ([place]) 07/07/2012   CLL (chronic lymphocytic leukemia) (HCC)    Complication of anesthesia    Cough    Eval  by pulm in past: essentially normal PFTs, cough ddx was variant asthma, upper airway cough syndrome (previously termed post nasal drip syndrome) or GERD,    Diverticulitis    Dysrhythmia    FAST HB TAKING METOPROLOL NOT FOR HTN   Fibromyalgia    GERD (gastroesophageal reflux disease)    Headache(784.0)    HX MIGRAINES    Heart murmur    History of echocardiogram    Echo 9/16:  Vigorous LVF, EF 65-70%, MVR ok (peak 12 mmHg, mean 6 mmHg), mild LAE, normal RVF.   History of tobacco abuse 06/22/2012   Mitral stenosis 06/20/2012   Morbid obesity ([place]) 06/20/2012   Pneumonia    2-3 YRS AGO    PONV (postoperative nausea and vomiting)    S/P mitral valve replacement with metallic valve [id number]   29 mm Sorin Carbomedics mechanical valve   Severe mitral regurgitation 06/20/2012    Current Outpatient Medications on File Prior to Visit   Medication Sig Dispense Refill   albuterol (PROVENTIL HFA;VENTOLIN HFA) 108 (90 BASE) MCG/ACT inhaler Inhale 2 puffs into the lungs every 6 (six) hours as needed for wheezing or shortness of breath. 1 Inhaler 0   ALPRAZolam (XANAX) 0.25 MG tablet Take 1 tablet (0.25 mg total) by mouth at bedtime as needed for sleep or anxiety. 30 tablet 0   amLODipine (NORVASC) 5 MG tablet Take 1 tablet (5 mg total) by mouth daily. 90 tablet 3   amoxicillin (AMOXIL) 500 MG tablet 4 tablets (2 G) 30-60 minutes prior to dental appointment. 4 tablet 6   calcium carbonate (OS-CAL) 600 MG TABS Take 600 mg by mouth 2 (two) times daily with a meal.      Cholecalciferol 25 MCG (1000 UT) tablet Take by mouth.     esomeprazole (NEXIUM) 20 MG capsule Take 40 mg by mouth daily.     ezetimibe (ZETIA) 10 MG tablet Take 10 mg by mouth daily.     furosemide (LASIX) 20 MG tablet Take 1 tablet (20 mg total) by mouth daily. 90 tablet 3   levocetirizine (XYZAL) 5 MG tablet Take 5 mg by mouth daily.     metoprolol succinate (TOPROL-XL) 25 MG 24 hr tablet Take 1 tablet (25 mg total) by mouth in the morning and at bedtime. 180 tablet 3   rosuvastatin (CRESTOR) 5 MG tablet Take 1 tablet (5 mg total) by mouth daily. 90 tablet 3   warfarin (COUMADIN) 5 MG tablet TAKE 1 TABLET BY MOUTH EVERY DAY OR AS DIRECTED BY ANTICOAGULATION CLINIC 100 tablet 1   No current facility-administered medications on file prior to visit.    Allergies  Allergen Reactions   Statins Other (See Comments)    Other reaction(s): Other Leg cramping Other reaction(s): Other Leg cramping   Aspirin Nausea Only    REACTION: GI upset    Blood pressure 126/78, pulse 74, SpO2 98 %.  Hypertension Assessment: BP is controlled (goal <130/80)in office BP 126/78 with heart rate 74 Takes amlodipine and metoprolol regularly and tolerates them well  Denies SOB, palpitation, chest pain, headaches,or swelling Home BP ~140/80; patient reports she is going through  some stressful situation at home   Plan:  Given at goal BP in the office will not make any changes to the current medications  Reiterated the importance of regular exercise and low salt diet  Continue taking amlodipine 5 mg and metoprolol Xl 25 mg twice daily  Bring in home BP monitor at the next visit for validation  Patient to keep record of BP readings with heart rate and report to [place] at the next visit Patient to see PharmD in 4 weeks for follow up  Follow up lab(s) : none   Thank you  Cammy Copa, Pharm.D [facility] A Division of [facility] [place] [address] [place], [place] [address]  Phone: [phone]; Fax: [phone]

## 2022-06-18 ENCOUNTER — Other Ambulatory Visit (HOSPITAL_BASED_OUTPATIENT_CLINIC_OR_DEPARTMENT_OTHER): Payer: Self-pay | Admitting: *Deleted

## 2022-06-18 ENCOUNTER — Ambulatory Visit (INDEPENDENT_AMBULATORY_CARE_PROVIDER_SITE_OTHER): Payer: Medicare HMO | Admitting: *Deleted

## 2022-06-18 ENCOUNTER — Ambulatory Visit: Payer: Medicare HMO | Attending: Internal Medicine | Admitting: Pharmacist Clinician (PhC)/ Clinical Pharmacy Specialist

## 2022-06-18 ENCOUNTER — Encounter: Payer: Self-pay | Admitting: Pharmacist Clinician (PhC)/ Clinical Pharmacy Specialist

## 2022-06-18 VITALS — BP 119/76 | HR 71

## 2022-06-18 DIAGNOSIS — I10 Essential (primary) hypertension: Secondary | ICD-10-CM | POA: Diagnosis not present

## 2022-06-18 DIAGNOSIS — Z954 Presence of other heart-valve replacement: Secondary | ICD-10-CM

## 2022-06-18 DIAGNOSIS — I059 Rheumatic mitral valve disease, unspecified: Secondary | ICD-10-CM

## 2022-06-18 DIAGNOSIS — Z5181 Encounter for therapeutic drug level monitoring: Secondary | ICD-10-CM

## 2022-06-18 LAB — POCT INR: INR: 2.1 (ref 2.0–3.0)

## 2022-06-18 NOTE — Progress Notes (Signed)
Office Visit    Patient Name: Priscilla Stevens Date of Encounter: 06/18/2022  Primary Care Provider:  Chesley Noon, MD Primary Cardiologist:  Dorris Carnes, MD  Chief Complaint    Hypertension  Significant Past Medical History   Mitral valve replacement Rheumatic disease, mechanical mitral valve  CHF diastolic  Hyperlipidemia LDL 117, on rosuvastatin 5, ezetimibe 10  COPD Prn albuterol  GERD On esomeprazole    Allergies  Allergen Reactions   Statins Other (See Comments)    Other reaction(s): Other Leg cramping Other reaction(s): Other Leg cramping   Aspirin Nausea Only    REACTION: GI upset    History of Present Illness    Priscilla Stevens is a 72 y.o. female patient of Dr Harrington Challenger, who was originally seen in PharmD lipid clinic, but also noted to have elevated blood pressure.  Patient states that she has always had good blood pressure readings until last year when it was checked in her right arm and found to be significantly higher (chart doesn't specify how much).  She was started on amlodipine 2.5 mg daily and it was later increased to 5 mg daily in late January.     Today she states doing well overall, but does have some episodes of dizziness/lightheadedness since increasing amlodipine dose.  Home readings on right arm are mostly low [id number] systolic, has not been checking left arm.  She says that in the past she had a procedure done on her right arm and restarted warfarin that night.  The next day she had pain and states her MD had to make a cut on her upper arm to "let the blood out".  Thinks this was 7-8 years ago.    Blood Pressure Goal:  130/80  Current Medications:  amlodipine 5 mg qd, metoprolol succ 25 mg bid  Family Hx:   mother had CHF, father had lung cancer (smoker)  Social Hx:      Tobacco: quit smoking ~8 years ago - 51 pack-year history  Alcohol: none  Caffeine: diet pepsi, coffee  Diet:    breakfast bars in the mornings, usually skips lunch;  snacks on fruit, oatmeal cakes, cheese/crackers; eats out couple of times each week, likes [place] and fish  Exercise: making crafts for flea market  Home BP readings: home cuff [facility] arm   Right arm 153/75  (was 153/82 by our device) Left arm 121/71  (was 119/76 by our device)     Accessory Clinical Findings    Lab Results  Component Value Date   CREATININE 0.93 03/18/2022   BUN 13 03/18/2022   NA 140 03/18/2022   K 4.1 03/18/2022   CL 103 03/18/2022   CO2 27 03/18/2022   Lab Results  Component Value Date   ALT 10 03/13/2019   AST 19 03/13/2019   ALKPHOS 85 03/13/2019   BILITOT 0.2 03/13/2019   Lab Results  Component Value Date   HGBA1C 5.6 02/18/2021    Home Medications    Current Outpatient Medications  Medication Sig Dispense Refill   albuterol (PROVENTIL HFA;VENTOLIN HFA) 108 (90 BASE) MCG/ACT inhaler Inhale 2 puffs into the lungs every 6 (six) hours as needed for wheezing or shortness of breath. 1 Inhaler 0   ALPRAZolam (XANAX) 0.25 MG tablet Take 1 tablet (0.25 mg total) by mouth at bedtime as needed for sleep or anxiety. 30 tablet 0   amLODipine (NORVASC) 5 MG tablet Take 1 tablet (5 mg total) by mouth daily. 90 tablet 3  amoxicillin (AMOXIL) 500 MG tablet 4 tablets (2 G) 30-60 minutes prior to dental appointment. 4 tablet 6   calcium carbonate (OS-CAL) 600 MG TABS Take 600 mg by mouth 2 (two) times daily with a meal.      Cholecalciferol 25 MCG (1000 UT) tablet Take by mouth.     esomeprazole (NEXIUM) 20 MG capsule Take 40 mg by mouth daily.     ezetimibe (ZETIA) 10 MG tablet Take 10 mg by mouth daily.     furosemide (LASIX) 20 MG tablet Take 1 tablet (20 mg total) by mouth daily. 90 tablet 3   levocetirizine (XYZAL) 5 MG tablet Take 5 mg by mouth daily.     metoprolol succinate (TOPROL-XL) 25 MG 24 hr tablet Take 1 tablet (25 mg total) by mouth in the morning and at bedtime. 180 tablet 3   rosuvastatin (CRESTOR) 5 MG tablet Take 1 tablet (5 mg total) by mouth  daily. 90 tablet 3   warfarin (COUMADIN) 5 MG tablet TAKE 1 TABLET BY MOUTH EVERY DAY OR AS DIRECTED BY ANTICOAGULATION CLINIC 100 tablet 1   No current facility-administered medications for this visit.         Assessment & Plan    Hypertension Assessment: BP is uncontrolled in office BP 153/82 mmHg (on right);  above the goal (<130/80). Left arm pressure WNL at 119/76 Home cuff read within 2/7 points on respective arms Tolerates metoprolol and amlodipine - having occasional dizziness/lightheadedness Denies SOB, palpitation, chest pain, headaches,or swelling Reiterated the importance of regular exercise and low salt diet   Plan:  Continue taking amlodipine and metoprolol. Patient to keep record of BP readings with heart rate and report to [place] at the next visit Reviewed with Dr. Oval Linsey, will get carotid dopplers to look for reasoning behind BP differences in arms Patient to follow up once results are in on the dopplers  Labs ordered today:  none   Tommy Medal PharmD CPP [facility]  [address] [place], [place] [address] [phone]

## 2022-06-18 NOTE — Patient Instructions (Signed)
I will reach out to you in a few days to let you know if we want to do any further testing to see why there is a big difference on the right vs left  Check your blood pressure at home daily (if able) and keep record of the readings.  Hypertension "High blood pressure"  Hypertension is often called "The Silent Killer." It rarely causes symptoms until it is extremely  high or has done damage to other organs in the body. For this reason, you should have your  blood pressure checked regularly by your physician. We will check your blood pressure  every time you see a provider at one of our offices.   Your blood pressure reading consists of two numbers. Ideally, blood pressure should be  below 120/80. The first ("top") number is called the systolic pressure. It measures the  pressure in your arteries as your heart beats. The second ("bottom") number is called the diastolic pressure. It measures the pressure in your arteries as the heart relaxes between beats.  The benefits of getting your blood pressure under control are enormous. A 10-point  reduction in systolic blood pressure can reduce your risk of stroke by 27% and heart failure by 28%  Your blood pressure goal is <130/80  To check your pressure at home you will need to:  1. Sit up in a chair, with feet flat on the floor and back supported. Do not cross your ankles or legs. 2. Rest your left arm so that the cuff is about heart level. If the cuff goes on your upper arm,  then just relax the arm on the table, arm of the chair or your lap. If you have a wrist cuff, we  suggest relaxing your wrist against your chest (think of it as Pledging the Flag with the  wrong arm).  3. Place the cuff snugly around your arm, about 1 inch above the crook of your elbow. The  cords should be inside the groove of your elbow.  4. Sit quietly, with the cuff in place, for about 5 minutes. After that 5 minutes press the power  button to start a reading. 5.  Do not talk or move while the reading is taking place.  6. Record your readings on a sheet of paper. Although most cuffs have a memory, it is often  easier to see a pattern developing when the numbers are all in front of you.  7. You can repeat the reading after 1-3 minutes if it is recommended  Make sure your bladder is empty and you have not had caffeine or tobacco within the last 30 min  Always bring your blood pressure log with you to your appointments. If you have not brought your monitor in to be double checked for accuracy, please bring it to your next appointment.  You can find a list of quality blood pressure cuffs at validatebp.org

## 2022-06-18 NOTE — Patient Instructions (Signed)
Description   Today take 1.5 tablets of warfarin then continue taking Warfarin 1 tablet daily except 1/2 tablet on Saturdays. Recheck INR in 4 weeks. Call with any questions new medications or procedures  #[phone] or [phone]

## 2022-06-18 NOTE — Assessment & Plan Note (Signed)
Assessment: BP is uncontrolled in office BP 153/82 mmHg (on right);  above the goal (<130/80). Left arm pressure WNL at 119/76 Home cuff read within 2/7 points on respective arms Tolerates metoprolol and amlodipine - having occasional dizziness/lightheadedness Denies SOB, palpitation, chest pain, headaches,or swelling Reiterated the importance of regular exercise and low salt diet   Plan:  Continue taking amlodipine and metoprolol. Patient to keep record of BP readings with heart rate and report to [place] at the next visit Reviewed with Dr. Oval Linsey, will get carotid dopplers to look for reasoning behind BP differences in arms Patient to follow up once results are in on the dopplers  Labs ordered today:  none

## 2022-07-15 ENCOUNTER — Ambulatory Visit: Payer: Medicare HMO | Attending: Internal Medicine | Admitting: *Deleted

## 2022-07-15 DIAGNOSIS — Z5181 Encounter for therapeutic drug level monitoring: Secondary | ICD-10-CM

## 2022-07-15 DIAGNOSIS — I059 Rheumatic mitral valve disease, unspecified: Secondary | ICD-10-CM

## 2022-07-15 DIAGNOSIS — Z954 Presence of other heart-valve replacement: Secondary | ICD-10-CM

## 2022-07-15 LAB — POCT INR: INR: 2.3 (ref 2.0–3.0)

## 2022-07-15 NOTE — Patient Instructions (Addendum)
Description   Today take 1.5 tablets of warfarin then START taking Warfarin 1 tablet daily. Recheck INR in 4 weeks. Call with any questions new medications or procedures  #[phone] or [phone]

## 2022-07-16 ENCOUNTER — Ambulatory Visit (INDEPENDENT_AMBULATORY_CARE_PROVIDER_SITE_OTHER): Payer: Medicare HMO

## 2022-07-16 DIAGNOSIS — R42 Dizziness and giddiness: Secondary | ICD-10-CM | POA: Diagnosis not present

## 2022-07-16 DIAGNOSIS — I10 Essential (primary) hypertension: Secondary | ICD-10-CM | POA: Diagnosis not present

## 2022-07-16 LAB — APOLIPOPROTEIN B: Apolipoprotein B: 51 mg/dL (ref <90)

## 2022-07-16 LAB — NMR, LIPOPROFILE
Cholesterol, Total: 112 mg/dL (ref 100–199)
HDL Particle Number: 41.9 umol/L (ref >=30.5)
HDL-C: 47 mg/dL (ref >39)
LDL Particle Number: 383 nmol/L (ref <1000)
LDL Size: 19.7 nm — ABNORMAL LOW (ref >20.5)
LDL-C (NIH Calc): 42 mg/dL (ref 0–99)
LP-IR Score: 47 — ABNORMAL HIGH (ref <=45)
Small LDL Particle Number: 318 nmol/L (ref <=527)
Triglycerides: 129 mg/dL (ref 0–149)

## 2022-08-12 ENCOUNTER — Ambulatory Visit: Payer: Medicare HMO | Attending: Cardiology

## 2022-08-12 DIAGNOSIS — Z954 Presence of other heart-valve replacement: Secondary | ICD-10-CM

## 2022-08-12 LAB — POCT INR: INR: 3.8 — AB (ref 2.0–3.0)

## 2022-08-12 NOTE — Patient Instructions (Signed)
Description   HOLD today's dose and then continue taking Warfarin 1 tablet daily.  Recheck INR in 4 weeks.  Call with any questions new medications or procedures  #[phone] or [phone]

## 2022-08-31 ENCOUNTER — Other Ambulatory Visit: Payer: Self-pay | Admitting: Internal Medicine

## 2022-08-31 DIAGNOSIS — Z952 Presence of prosthetic heart valve: Secondary | ICD-10-CM

## 2022-09-09 ENCOUNTER — Ambulatory Visit: Payer: Medicare HMO | Attending: Internal Medicine

## 2022-09-09 DIAGNOSIS — I059 Rheumatic mitral valve disease, unspecified: Secondary | ICD-10-CM

## 2022-09-09 DIAGNOSIS — Z954 Presence of other heart-valve replacement: Secondary | ICD-10-CM

## 2022-09-09 DIAGNOSIS — Z5181 Encounter for therapeutic drug level monitoring: Secondary | ICD-10-CM | POA: Diagnosis not present

## 2022-09-09 LAB — POCT INR: INR: 2.8 (ref 2.0–3.0)

## 2022-09-09 NOTE — Patient Instructions (Signed)
Description   Continue taking Warfarin 1 tablet daily.  Stay consistent with greens each week.  Recheck INR in 5 weeks.  Call with any questions new medications or procedures  #[phone] or [phone]

## 2022-10-14 ENCOUNTER — Ambulatory Visit: Payer: Medicare HMO | Attending: Internal Medicine | Admitting: *Deleted

## 2022-10-14 DIAGNOSIS — Z5181 Encounter for therapeutic drug level monitoring: Secondary | ICD-10-CM

## 2022-10-14 DIAGNOSIS — Z954 Presence of other heart-valve replacement: Secondary | ICD-10-CM | POA: Diagnosis not present

## 2022-10-14 LAB — POCT INR: POC INR: 3.1

## 2022-10-14 NOTE — Patient Instructions (Signed)
Description   Continue taking Warfarin 1 tablet daily.  Stay consistent with greens each week.  Recheck INR in 6 weeks.  Call with any questions new medications or procedures  #[phone] or [phone]

## 2022-11-25 ENCOUNTER — Ambulatory Visit: Payer: Medicare HMO | Attending: Internal Medicine | Admitting: *Deleted

## 2022-11-25 DIAGNOSIS — I059 Rheumatic mitral valve disease, unspecified: Secondary | ICD-10-CM | POA: Diagnosis not present

## 2022-11-25 DIAGNOSIS — Z954 Presence of other heart-valve replacement: Secondary | ICD-10-CM

## 2022-11-25 DIAGNOSIS — Z5181 Encounter for therapeutic drug level monitoring: Secondary | ICD-10-CM

## 2022-11-25 LAB — POCT INR: INR: 3.4 — AB (ref 2.0–3.0)

## 2022-11-25 NOTE — Patient Instructions (Signed)
Description   Continue taking Warfarin 1 tablet daily.  Stay consistent with greens each week.  Recheck INR in 6 weeks.  Call with any questions new medications or procedures  #[phone] or [phone]

## 2022-11-30 ENCOUNTER — Encounter: Payer: Self-pay | Admitting: Podiatry

## 2022-11-30 ENCOUNTER — Ambulatory Visit (INDEPENDENT_AMBULATORY_CARE_PROVIDER_SITE_OTHER): Payer: Medicare HMO | Admitting: Podiatry

## 2022-11-30 DIAGNOSIS — D689 Coagulation defect, unspecified: Secondary | ICD-10-CM | POA: Diagnosis not present

## 2022-11-30 DIAGNOSIS — L84 Corns and callosities: Secondary | ICD-10-CM

## 2022-11-30 NOTE — Progress Notes (Signed)
  Subjective:  Patient ID: Priscilla Stevens, female    DOB: 12-09-1950,   MRN: [id number]  Chief Complaint  Patient presents with   Callouses    Pt present today for pain on the third toe and 4th toe on the left foot.     72 y.o. female presents for concern of pain on her right fourth and third toe. Relates it has been going on for two years but on and off. Relates it improves and worsens with certain shoes. She is on a blood thinner and at risk for foot care.  . Denies any other pedal complaints. Denies n/v/f/c.   Past Medical History:  Diagnosis Date   Acute on chronic diastolic heart failure (HCC) 06/22/2012   Anxiety    CHF (congestive heart failure) (HCC)    Chronic diastolic congestive heart failure (HCC) 07/07/2012   CLL (chronic lymphocytic leukemia) (HCC)    Complication of anesthesia    Cough    Eval by pulm in past: essentially normal PFTs, cough ddx was variant asthma, upper airway cough syndrome (previously termed post nasal drip syndrome) or GERD,    Diverticulitis    Dysrhythmia    FAST HB TAKING METOPROLOL NOT FOR HTN   Fibromyalgia    GERD (gastroesophageal reflux disease)    Headache(784.0)    HX MIGRAINES    Heart murmur    History of echocardiogram    Echo 9/16:  Vigorous LVF, EF 65-70%, MVR ok (peak 12 mmHg, mean 6 mmHg), mild LAE, normal RVF.   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 CFT <3 seconds. Normal hair growth on digits. No edema.  Skin. No lacerations or abrasions bilateral feet.  Hyperkeratotic tissue noted to lateral right third digit and medial right fourth digit.  Musculoskeletal: MMT 5/5 bilateral lower  extremities in DF, PF, Inversion and Eversion. Deceased ROM in DF of ankle joint. Hammered digits 2-5 bilateral Neurological: Sensation intact to light touch.   Assessment:   1. Coagulation defect (HCC)   2. Corn or callus      Plan:  Patient was evaluated and treated and all questions answered. -Discussed corns and calluses with patient and treatment options.  -Hyperkeratotic tissue was debrided with chisel without incident.  -Patient on blood thinner and at risk for care.  -Encouraged daily moisturizing -Discussed use of pumice stone -Advised good supportive shoes and inserts and padding around the toes.  -Patient to return to office as needed or sooner if condition worsens.   Priscilla Stevens, DPM

## 2023-01-06 ENCOUNTER — Ambulatory Visit: Payer: Medicare HMO | Attending: Cardiology | Admitting: *Deleted

## 2023-01-06 DIAGNOSIS — Z954 Presence of other heart-valve replacement: Secondary | ICD-10-CM

## 2023-01-06 DIAGNOSIS — Z5181 Encounter for therapeutic drug level monitoring: Secondary | ICD-10-CM

## 2023-01-06 DIAGNOSIS — I059 Rheumatic mitral valve disease, unspecified: Secondary | ICD-10-CM | POA: Diagnosis not present

## 2023-01-06 LAB — POCT INR: INR: 4.4 — AB (ref 2.0–3.0)

## 2023-01-06 NOTE — Patient Instructions (Addendum)
Description   Do not take any warfarin today then continue taking Warfarin 1 tablet daily. Stay consistent with greens each week.  Recheck INR in 4 weeks. Call with any questions new medications or procedures  #[phone] or [phone]

## 2023-01-14 ENCOUNTER — Telehealth (HOSPITAL_BASED_OUTPATIENT_CLINIC_OR_DEPARTMENT_OTHER): Payer: Self-pay

## 2023-01-14 DIAGNOSIS — I739 Peripheral vascular disease, unspecified: Secondary | ICD-10-CM

## 2023-01-14 NOTE — Telephone Encounter (Signed)
-----   Message from Phillips Hay sent at 01/14/2023 12:53 PM EDT ----- Regarding: FW: carotid test Hi  I reviewed the information with patient.  Could you do me a favor and put in a referral to see Berry/Arida for PVD.  I'll reach out to scheduling and have them call her about getting an appointment with Dr. Tenny Craw.  Thank you! ----- Message ----- From: Alver Sorrow, NP Sent: 01/14/2023  12:33 PM EDT To: Rosalee Kaufman, RPH-CPP Subject: RE: carotid test                               So I can give a partial answer lol.  Bilateral carotids with mild nonobstructive stenosis. Repeat carotid duplex generally recommended in 1 year for monitoring to ensure no progression. Her Rosuvastatin will help to prevent progression.   Regarding the "dampened monophasic flow in left subclavian artery (?high grade stenosis vs occlusion more proximally". She did have the asymmetric BP readings [R 153/82 mmHg vs L 119/76]. It would probably be worthwhile for her to see peripheral vascular Kirke Corin or Allyson Sabal) - I would probably just send her cause idk what imaging to do prior. I'm not sure if they'll do more detailed dopplers vs CTA. Dr. Duke Salvia or Dr. Tenny Craw might have more of an idea. If she has reduced flow to the left arm that would make sense the BP is lower on that side.  Caitlin ----- Message ----- From: Rosalee Kaufman, RPH-CPP Sent: 01/14/2023  11:00 AM EDT To: Alver Sorrow, NP Subject: carotid test                                   Luther Parody  We did carotid dopplers on this HTN patient back in April and for some reason the results never got to the patient.  I can tell her that she has < 40% stenosis in the carotids, but as far as the other comments - it was suggested to repeat in 1 year and get PVD consult.  Is that what we need to tell her?  If you have a moment could you look at the results?  I'd be happy to call once someone tells me what I'm looking at :)

## 2023-01-15 ENCOUNTER — Other Ambulatory Visit: Payer: Self-pay | Admitting: Internal Medicine

## 2023-01-18 NOTE — Progress Notes (Unsigned)
Cardiology Office Note   Date:  01/19/2023   ID:  Priscilla Stevens, DOB [birth year], MRN [id number]  PCP:  Priscilla Inch, MD  Cardiologist:  Dr. Tenny Craw.   No chief complaint on file.     History of Present Illness: Priscilla Stevens is a 72 y.o. female who was referred by Priscilla Stevens for evaluation of possible left subclavian artery stenosis. 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  She was noted to have lower blood pressure on the left arm compared to the lower right arm.  She is right-handed but she uses her hands frequently to make crafts.  She occasionally has some fatigue in the left arm and also mild episodes of intermittent dizziness and nausea.  She had carotid Doppler done in April of this year which showed mild nonobstructive carotid disease.  There was a 40 mm systolic gradient between the left arm and the right arm.  The left arm was lower.  There was evidence of retrograde flow in the left vertebral artery.  No chest pain or shortness of breath.  She is not diabetic.  She quit smoking in 2014.  Past Medical History:  Diagnosis Date   Acute on chronic diastolic heart failure (HCC) 06/22/2012   Anxiety    CHF (congestive heart failure) (HCC)    Chronic diastolic congestive heart failure (HCC) 07/07/2012   CLL (chronic lymphocytic leukemia) (HCC)    Complication of anesthesia    Cough    Eval by pulm in past: essentially normal PFTs, cough ddx was variant asthma, upper airway cough syndrome (previously termed post nasal drip syndrome) or GERD,    Diverticulitis    Dysrhythmia    FAST HB TAKING METOPROLOL NOT FOR HTN   Fibromyalgia    GERD (gastroesophageal reflux disease)    Headache(784.0)    HX MIGRAINES    Heart murmur    History of echocardiogram    Echo 9/16:  Vigorous LVF, EF 65-70%, MVR ok (peak 12 mmHg,  mean 6 mmHg), mild LAE, normal RVF.   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   29 mm Sorin Carbomedics mechanical valve   Severe mitral regurgitation 06/20/2012    Past Surgical History:  Procedure Laterality Date   APPENDECTOMY     INTRAOPERATIVE TRANSESOPHAGEAL ECHOCARDIOGRAM N/A 07/07/2012   Procedure: INTRAOPERATIVE TRANSESOPHAGEAL ECHOCARDIOGRAM;  Surgeon: Purcell Nails, MD;  Location: [facility] OR;  Service: Open Heart Surgery;  Laterality: N/A;   LEFT AND RIGHT HEART CATHETERIZATION WITH CORONARY ANGIOGRAM N/A 06/21/2012   Procedure: LEFT AND RIGHT HEART CATHETERIZATION WITH CORONARY ANGIOGRAM;  Surgeon: Laurey Morale, MD;  Location: [facility] CATH LAB;  Service: Cardiovascular;  Laterality: N/A;   MITRAL VALVE REPLACEMENT N/A 07/07/2012   Procedure: MITRAL VALVE (MV) REPLACEMENT;  Surgeon: Purcell Nails, MD;  Location: [facility] OR;  Service: Open Heart Surgery;  Laterality: N/A;   NO PAST SURGERIES     TUBAL PREG    TEE WITHOUT CARDIOVERSION N/A 06/22/2012   Procedure: TRANSESOPHAGEAL ECHOCARDIOGRAM (TEE);  Surgeon: Laurey Morale, MD;  Location: [facility] ENDOSCOPY;  Service: Cardiovascular;  Laterality: N/A;   TONSILLECTOMY     TOTAL VAGINAL  HYSTERECTOMY       Current Outpatient Medications  Medication Sig Dispense Refill   albuterol (PROVENTIL HFA;VENTOLIN HFA) 108 (90 BASE) MCG/ACT inhaler Inhale 2 puffs into the lungs every 6 (six) hours as needed for wheezing or shortness of breath. 1 Inhaler 0   ALPRAZolam (XANAX) 0.25 MG tablet Take 1 tablet (0.25 mg total) by mouth at bedtime as needed for sleep or anxiety. 30 tablet 0   amLODipine (NORVASC) 5 MG tablet Take 1 tablet (5 mg total) by mouth daily. 90 tablet 3   amoxicillin (AMOXIL) 500 MG tablet 4 tablets (2 G) 30-60 minutes prior to dental appointment. 4  tablet 6   azelastine (ASTELIN) 0.1 % nasal spray Place 2 sprays into the nose 2 (two) times daily.     calcium carbonate (OS-CAL) 600 MG TABS Take 600 mg by mouth 2 (two) times daily with a meal.      Cholecalciferol 25 MCG (1000 UT) tablet Take by mouth.     esomeprazole (NEXIUM) 20 MG capsule Take 40 mg by mouth daily.     ezetimibe (ZETIA) 10 MG tablet Take 10 mg by mouth daily.     furosemide (LASIX) 20 MG tablet Take 1 tablet (20 mg total) by mouth daily. 90 tablet 3   levocetirizine (XYZAL) 5 MG tablet Take 5 mg by mouth daily.     metoprolol succinate (TOPROL-XL) 25 MG 24 hr tablet Take 1 tablet (25 mg total) by mouth in the morning and at bedtime. 180 tablet 3   montelukast (SINGULAIR) 10 MG tablet Take 1 tablet by mouth daily.     pantoprazole (PROTONIX) 40 MG tablet Take 40 mg by mouth daily.     rosuvastatin (CRESTOR) 5 MG tablet TAKE 1 TABLET (5 MG TOTAL) BY MOUTH DAILY. 30 tablet 0   warfarin (COUMADIN) 5 MG tablet TAKE 1 TABLET BY MOUTH EVERY DAY OR AS DIRECTED BY ANTICOAGULATION CLINIC 100 tablet 1   No current facility-administered medications for this visit.    Allergies:   Statins and Aspirin    Social History:  The patient  reports that she quit smoking about 9 years ago. Her smoking use included cigarettes. She started smoking about 45 years ago. She has a 36 pack-year smoking history. She has never used smokeless tobacco. She reports current alcohol use. She reports that she does not use drugs.   Family History:  The patient's family history includes Cancer - Lung in her father; Congestive Heart Failure in her mother.    ROS:  Please see the history of present illness.   Otherwise, review of systems are positive for none.   All other systems are reviewed and negative.    PHYSICAL EXAM: VS:  BP 138/84   Pulse 74   Ht 5\' 5"  (1.651 m)   Wt 166 lb 12.8 oz (75.7 kg)   SpO2 97%   BMI 27.76 kg/m  , BMI Body mass index is 27.76 kg/m. GEN: Well nourished, well  developed, in no acute distress  HEENT: normal  Neck: no JVD, carotid bruits, or masses Cardiac: Regular and normal mechanical heart sounds; no murmurs, rubs, or gallops,no edema  Respiratory:  clear to auscultation bilaterally, normal work of breathing GI: soft, nontender, nondistended, + BS MS: no deformity or atrophy  Skin: warm and dry, no rash Neuro:  Strength and sensation are intact Psych: euthymic mood, full affect Vascular: Radial pulse is +2 on the right and +1 on the left.  Distal pulses are normal.  EKG:  EKG is not ordered today.    Recent Labs: 03/18/2022: BUN 13; Creatinine, Ser 0.93; Hemoglobin 13.4; Platelets 247; Potassium 4.1; Sodium 140    Lipid Panel    Component Value Date/Time   CHOL 186 02/18/2021 1021   TRIG 228 (H) 02/18/2021 1021   HDL 43 02/18/2021 1021   CHOLHDL 4.3 02/18/2021 1021   CHOLHDL 3.1 07/08/2015 1510   VLDL 43 (H) 07/08/2015 1510   LDLCALC 104 (H) 02/18/2021 1021      Wt Readings from Last 3 Encounters:  01/19/23 166 lb 12.8 oz (75.7 kg)  04/15/22 182 lb 0.4 oz (82.6 kg)  03/18/22 181 lb 9.6 oz (82.4 kg)            No data to display            ASSESSMENT AND PLAN:  1.  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  She does not feel that her symptoms are severe enough to justify revascularization. Recommend continuing medical therapy for now.  Angiography and revascularization can be considered if her symptoms worsen. Her blood pressure should always be checked in the right arm which is more reflective of her central aortic pressure.  2.  Mechanical mitral valve: She is on long-term anticoagulation with warfarin.  3.  Essential hypertension: Blood pressures seems to be well-controlled on current medications.  4.  Hyperlipidemia: Currently on  rosuvastatin and ezetimibe.  Recommended target LDL of less than 70.    Disposition:   FU with me in 6 months  Signed,  Lorine Bears, MD  01/19/2023 9:04 AM    [facility]

## 2023-01-19 ENCOUNTER — Ambulatory Visit: Payer: Medicare HMO | Attending: Cardiovascular Disease | Admitting: Cardiovascular Disease

## 2023-01-19 ENCOUNTER — Encounter: Payer: Self-pay | Admitting: Cardiovascular Disease

## 2023-01-19 VITALS — BP 138/84 | HR 74 | Ht 65.0 in | Wt 166.8 lb

## 2023-01-19 DIAGNOSIS — I10 Essential (primary) hypertension: Secondary | ICD-10-CM

## 2023-01-19 DIAGNOSIS — Z954 Presence of other heart-valve replacement: Secondary | ICD-10-CM | POA: Diagnosis not present

## 2023-01-19 DIAGNOSIS — E785 Hyperlipidemia, unspecified: Secondary | ICD-10-CM

## 2023-01-19 DIAGNOSIS — I771 Stricture of artery: Secondary | ICD-10-CM | POA: Diagnosis not present

## 2023-01-19 NOTE — Patient Instructions (Signed)
Medication Instructions:  No changes *If you need a refill on your cardiac medications before your next appointment, please call your pharmacy*   Lab Work: None ordered If you have labs (blood work) drawn today and your tests are completely normal, you will receive your results only by: MyChart Message (if you have MyChart) OR A paper copy in the mail If you have any lab test that is abnormal or we need to change your treatment, we will call you to review the results.   Testing/Procedures: None ordered   Follow-Up: At [facility], you and your health needs are our priority.  As part of our continuing mission to provide you with exceptional heart care, we have created designated Provider Care Teams.  These Care Teams include your primary Cardiologist (physician) and Advanced Practice Providers (APPs -  Physician Assistants and Nurse Practitioners) who all work together to provide you with the care you need, when you need it.  We recommend signing up for the patient portal called "MyChart".  Sign up information is provided on this After Visit Summary.  MyChart is used to connect with patients for Virtual Visits (Telemedicine).  Patients are able to view lab/test results, encounter notes, upcoming appointments, etc.  Non-urgent messages can be sent to your provider as well.   To learn more about what you can do with MyChart, go to [email or web address].    Your next appointment:   6 month(s)  Provider:   Dr. Kirke Corin

## 2023-02-03 ENCOUNTER — Ambulatory Visit: Payer: Medicare HMO | Attending: Internal Medicine | Admitting: *Deleted

## 2023-02-03 DIAGNOSIS — I059 Rheumatic mitral valve disease, unspecified: Secondary | ICD-10-CM | POA: Diagnosis not present

## 2023-02-03 DIAGNOSIS — Z954 Presence of other heart-valve replacement: Secondary | ICD-10-CM

## 2023-02-03 DIAGNOSIS — Z5181 Encounter for therapeutic drug level monitoring: Secondary | ICD-10-CM | POA: Diagnosis not present

## 2023-02-03 LAB — POCT INR: INR: 2.4 (ref 2.0–3.0)

## 2023-02-03 NOTE — Patient Instructions (Signed)
Description   Today take 1.5 tablets of warfarin then continue taking Warfarin 1 tablet daily. Stay consistent with greens each week. Recheck INR in 4 weeks. Call with any questions new medications or procedures  #[phone] or [phone]

## 2023-03-03 ENCOUNTER — Ambulatory Visit: Payer: Medicare HMO | Attending: Cardiology | Admitting: *Deleted

## 2023-03-03 DIAGNOSIS — Z954 Presence of other heart-valve replacement: Secondary | ICD-10-CM

## 2023-03-03 DIAGNOSIS — Z5181 Encounter for therapeutic drug level monitoring: Secondary | ICD-10-CM

## 2023-03-03 DIAGNOSIS — I059 Rheumatic mitral valve disease, unspecified: Secondary | ICD-10-CM | POA: Diagnosis not present

## 2023-03-03 LAB — POCT INR: INR: 2.2 (ref 2.0–3.0)

## 2023-03-03 NOTE — Patient Instructions (Signed)
Description   Today take 1.5 tablets of warfarin then START taking Warfarin 1 tablet daily except 1.5 tablets of Wednesdays. Stay consistent with greens each week. Recheck INR in 4 weeks. Call with any questions new medications or procedures  #[phone] or [phone]

## 2023-03-14 ENCOUNTER — Other Ambulatory Visit: Payer: Self-pay | Admitting: Internal Medicine

## 2023-03-14 DIAGNOSIS — Z952 Presence of prosthetic heart valve: Secondary | ICD-10-CM

## 2023-03-16 ENCOUNTER — Ambulatory Visit: Payer: Medicare HMO | Admitting: Nurse Practitioner

## 2023-03-16 ENCOUNTER — Encounter: Payer: Self-pay | Admitting: Emergency Medicine

## 2023-03-16 ENCOUNTER — Ambulatory Visit: Payer: Medicare HMO | Attending: Nurse Practitioner | Admitting: Emergency Medicine

## 2023-03-16 VITALS — BP 138/72 | HR 78 | Ht 65.0 in | Wt 170.0 lb

## 2023-03-16 DIAGNOSIS — I059 Rheumatic mitral valve disease, unspecified: Secondary | ICD-10-CM

## 2023-03-16 DIAGNOSIS — I6523 Occlusion and stenosis of bilateral carotid arteries: Secondary | ICD-10-CM

## 2023-03-16 DIAGNOSIS — I10 Essential (primary) hypertension: Secondary | ICD-10-CM | POA: Diagnosis not present

## 2023-03-16 DIAGNOSIS — Z954 Presence of other heart-valve replacement: Secondary | ICD-10-CM

## 2023-03-16 DIAGNOSIS — I771 Stricture of artery: Secondary | ICD-10-CM

## 2023-03-16 DIAGNOSIS — E785 Hyperlipidemia, unspecified: Secondary | ICD-10-CM

## 2023-03-16 DIAGNOSIS — K219 Gastro-esophageal reflux disease without esophagitis: Secondary | ICD-10-CM

## 2023-03-16 DIAGNOSIS — I5032 Chronic diastolic (congestive) heart failure: Secondary | ICD-10-CM

## 2023-03-16 MED ORDER — AMOXICILLIN 500 MG PO TABS: ORAL_TABLET | ORAL | 6 refills | Status: DC

## 2023-03-16 NOTE — Progress Notes (Signed)
Cardiology Office Note:    Date:  03/16/2023  ID:  Priscilla Stevens, DOB 05-Aug-1950, MRN [id number] PCP: Priscilla Inch, MD  [facility] Providers Cardiologist:  Dietrich Pates, MD PV Cardiologist:  Lorine Bears, MD       Patient Profile:      Priscilla Stevens. 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  In 2014 she is s/p mitral valve replacement with mechanical MVR.  Echocardiogram 02/07/2020 shows LVEF 50-55%, no RWMA, mild LVH, RV SF normal, left atrial size severely dilated, repaired/replaced mitral valve with no evidence of mitral valve regurgitation or mitral stenosis.  She has been following with Pharm.D. for both hyperlipidemia and hypertension. Carotid Dopplers performed April 2024 that showed mild nonobstructive carotid disease.  There was a 40 mm systolic gradient between the left arm and the right arm.  There was evidence of retrograde flow in the left vertebral artery.  She was last seen by Dr. 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  She seemed to only be mildly symptomatic and continued medical therapy was recommended.      History of Present Illness:  Discussed the use of AI scribe software for clinical note transcription with the patient, who gave verbal consent to proceed.  Priscilla Stevens is a 72 y.o. female who returns for 1 follow-up for MVR, CHF.  History of Present Illness   Today, patient is doing well overall and denies any major cardiovascular issues over the past year.  She notes ongoing sinus congestion for several months despite being on two different allergy medications and a nasal spray, the patient reports no relief. The patient describes the sinus area as tender and feels like a fire is present.  She has to  follow-up with her PCP regarding her symptoms and let her PCP know that if she needs antibiotics to reach out to our Coumadin clinic for closer monitoring.  The patient also reports postprandial shortness of breath that has been ongoing for about a year.  She notes this typically only happens after she eats a large meal that causes her to be SOB for around 20 minutes.  The patient denies any chest pain or shortness of breath unrelated to eating such as on exertion or during stress.  She denies any steal syndrome symptoms such as lightheadedness or dizziness or syncope.  She used to take her blood pressure at home daily however she takes it maybe once a week and her blood pressure ranges from 130s to 140s.  The patient also mentions a dental appointment in January for which they need antibiotics due to their mechanical valve.  She denies chest pain, lower extremity edema, fatigue, palpitations, melena, hematuria, hemoptysis, diaphoresis, weakness, presyncope, syncope, orthopnea, and PND.    Review of Systems  Constitutional: Negative for weight gain and weight loss.  HENT:  Positive for congestion.   Cardiovascular:  Negative for chest pain, claudication, cyanosis, dyspnea on exertion, irregular heartbeat, leg swelling, near-syncope, orthopnea, palpitations, paroxysmal nocturnal dyspnea and syncope.  Respiratory:  Positive for cough and shortness of breath. Negative for hemoptysis and wheezing.   Hematologic/Lymphatic: Negative for bleeding problem.  Gastrointestinal:  Negative for abdominal pain, hematochezia and melena.  Genitourinary:  Negative for hematuria.  Neurological:  Negative for dizziness, headaches, light-headedness and numbness.  See HPI     Studies Reviewed:   EKG Interpretation Date/Time:  Tuesday March 16 2023 13:36:17 EST Ventricular Rate:  75 PR Interval:  200 QRS Duration:  86 QT Interval:  410 QTC Calculation: 457 R Axis:   60  Text Interpretation: Normal sinus  rhythm Nonspecific ST and T wave abnormality Confirmed by Rise Paganini [phone]) on 03/16/2023 2:31:36 PM    Carotid Duplex 07/16/2022 Right Carotid: Velocities in the right ICA are consistent with a 1-39%  stenosis. Left Carotid: Velocities in the left ICA are consistent with a 1-39%  stenosis.   ECHO 02/07/2020  1. Left ventricular ejection fraction, by estimation, is 50 to 55%. The  left ventricle has low normal function. The left ventricle has no regional  wall motion abnormalities. There is mild concentric left ventricular  hypertrophy. Left ventricular  diastolic parameters are indeterminate.   2. Right ventricular systolic function is normal. The right ventricular  size is normal. There is normal pulmonary artery systolic pressure.   3. Left atrial size was severely dilated.   4. The mitral valve has been repaired/replaced. No evidence of mitral  valve regurgitation. No evidence of mitral stenosis. Procedure Date: 2014.  Echo findings are consistent with normal structure and function of the  mitral valve prosthesis.   5. The aortic valve is tricuspid. Aortic valve regurgitation is not  visualized. No aortic stenosis is present.   6. The inferior vena cava is normal in size with greater than 50%  respiratory variability, suggesting right atrial pressure of 3 mmHg.   ECHO 03/02/2017 - Left ventricle: The cavity size was normal. Wall thickness was    normal. Systolic function was normal. The estimated ejection    fraction was in the range of 60% to 65%. Indeterminant diastolic    function. Wall motion was normal; there were no regional wall    motion abnormalities.  - Aortic valve: There was no stenosis.  - Mitral valve: Mechanical mitral valve. No significant mitral    regurgitation was detected. No significant prosthetic valve    stenosis. Pressure half-time: 87 ms. Mean gradient (D): 5 mm Hg.  - Left atrium: The atrium was mildly dilated.  - Right ventricle: The cavity  size was normal. Systolic function    was normal.  - Pulmonary arteries: No complete TR doppler jet so unable to    estimate PA systolic pressure.  - Inferior vena cava: The vessel was normal in size. The    respirophasic diameter changes were in the normal range (>= 50%),    consistent with normal central venous pressure.   Risk Assessment/Calculations:             Physical Exam:   VS:  BP 138/72 (BP Location: Right Arm, Patient Position: Sitting, Cuff Size: Normal)   Pulse 78   Ht 5\' 5"  (1.651 m)   Wt 170 lb (77.1 kg)   SpO2 96%   BMI 28.29 kg/m    Wt Readings from Last 3 Encounters:  03/16/23 170 lb (77.1 kg)  01/19/23 166 lb 12.8 oz (75.7 kg)  04/15/22 182 lb 0.4 oz (82.6 kg)    Constitutional:      Appearance: Normal and healthy appearance.  HENT:     Head: Normocephalic.  Neck:     Vascular: JVD normal.  Pulmonary:     Effort: Pulmonary effort is normal.     Breath sounds: Normal breath sounds.  Chest:     Chest wall: Not tender to palpatation.  Cardiovascular:     PMI at left midclavicular line. Normal rate. Regular rhythm. Normal S1. Mechanical S1 Normal S2.      Murmurs: There is no murmur.     No gallop.  No click. No rub.  Pulses:    Intact distal pulses.  Edema:    Peripheral edema absent.  Musculoskeletal: Normal range of motion.     Cervical back: Normal range of motion and neck supple. Skin:    General: Skin is warm and dry.  Neurological:     General: No focal deficit present.     Mental Status: Alert, oriented to person, place, and time and oriented to person, place and time.  Psychiatric:        Attention and Perception: Attention and perception normal.        Mood and Affect: Mood normal.        Behavior: Behavior is cooperative.        Thought Content: Thought content normal.        Assessment and Plan:  Mechanical mitral valve -Mitral valve replacement 2014.  No reported issues currently asymptomatic.  No change in clinical status we  will continue to monitor -Echocardiogram 02/07/2020 showing consistent with normal structure and function of bilateral valve prosthesis -Ongoing anticoagulation with Coumadin.  Follows with Coumadin clinic (INR 2.2 on 03/03/23).  No bleeding issues. -SBE prophylaxis: Prescribed amoxicillin 2G to be taken 30-60 minutes prior to dental appointment.  She has dental cleaning January 2025  Hypertension -BP today 138/72.  Slightly above goal of less than 130/80.  She notes home daily averages 130s-140s. -She will monitor her blood pressure daily at home over the next 2 weeks and send reports over MyChart -If needed can increase amlodipine to 10 mg -Continue amlodipine 5 mg, Toprol XL 25 mg -Encouraged 150 minutes of moderate intensity aerobic exercise weekly -CBC and CMP today for routine monitoring  Chronic diastolic heart failure -EF 50-55% per echo 02/07/2020 -Euvolemic and well compensated -Continue Lasix 20 mg daily and Toprol XL 25 mg daily  Hyperlipidemia, LDL goal<70 -LDL-C 42, Apolipoprotein B 51 on 07/15/2022 -Under excellent control -Continue rosuvastatin 5 mg, ezetimibe 10 mg -Increase fiber, fruits, vegetables in diet.  Decrease processed foods.  Carotid artery disease -Carotid duplex 07/16/2022 showing left and right ICA consistent with 1-39% stenosis -Asymptomatic.  Will continue to monitor -Lipids well under control  Left subclavian artery stenosis with subclavian steal syndrome -No symptoms related to steal syndrome today.  No lightheadedness, dizziness, syncope.  She may have slight left arm weakness when she raises it over her head -Follows with Dr. Kirke Corin with next appointment x 5 months.  Plan is to continue medical therapy -Angiography and revascularization can be considered if her symptoms worsen  Gastroesophageal Reflux Disease (GERD) -Reports of heartburn and SOB after eating large meals, possibly related to GERD. No DOE or exertional angina.  Resolves on its own. She  does not experience this when she eats multiple small meals.  Currently on Protonix. -Recommend discussing symptoms with gastroenterologist. Possible need for endoscopy.        Dispo: Follow-up in x5 months with Dr. Kirke Corin and Dr. Tenny Craw and x1 year  Signed, Denyce Robert, NP

## 2023-03-16 NOTE — Patient Instructions (Signed)
Medication Instructions:  Your physician recommends that you continue on your current medications as directed. Please refer to the Current Medication list given to you today.  *If you need a refill on your cardiac medications before your next appointment, please call your pharmacy*   Lab Work: TODAY:  CMET & CBC  If you have labs (blood work) drawn today and your tests are completely normal, you will receive your results only by: MyChart Message (if you have MyChart) OR A paper copy in the mail If you have any lab test that is abnormal or we need to change your treatment, we will call you to review the results.   Testing/Procedures: None ordered   Follow-Up: At [facility], you and your health needs are our priority.  As part of our continuing mission to provide you with exceptional heart care, we have created designated Provider Care Teams.  These Care Teams include your primary Cardiologist (physician) and Advanced Practice Providers (APPs -  Physician Assistants and Nurse Practitioners) who all work together to provide you with the care you need, when you need it.  We recommend signing up for the patient portal called "MyChart".  Sign up information is provided on this After Visit Summary.  MyChart is used to connect with patients for Virtual Visits (Telemedicine).  Patients are able to view lab/test results, encounter notes, upcoming appointments, etc.  Non-urgent messages can be sent to your provider as well.   To learn more about what you can do with MyChart, go to [email or web address].    Your next appointment:   12 month(s)  Provider:   Dietrich Pates, MD     Other Instructions

## 2023-03-17 LAB — COMPREHENSIVE METABOLIC PANEL
ALT: 16 [IU]/L (ref 0–32)
AST: 24 [IU]/L (ref 0–40)
Albumin: 4.8 g/dL (ref 3.9–4.9)
Alkaline Phosphatase: 90 [IU]/L (ref 44–121)
BUN/Creatinine Ratio: 12 (ref 12–28)
BUN: 12 mg/dL (ref 8–27)
Bilirubin Total: 0.3 mg/dL (ref 0.0–1.2)
CO2: 26 mmol/L (ref 20–29)
Calcium: 9.6 mg/dL (ref 8.7–10.3)
Chloride: 105 mmol/L (ref 96–106)
Creatinine, Ser: 0.98 mg/dL (ref 0.57–1.00)
Globulin, Total: 2.7 g/dL (ref 1.5–4.5)
Glucose: 95 mg/dL (ref 70–99)
Potassium: 3.9 mmol/L (ref 3.5–5.2)
Sodium: 145 mmol/L — ABNORMAL HIGH (ref 134–144)
Total Protein: 7.5 g/dL (ref 6.0–8.5)
eGFR: 62 mL/min/{1.73_m2} (ref >59)

## 2023-03-17 LAB — CBC
Hematocrit: 45.7 % (ref 34.0–46.6)
Hemoglobin: 15 g/dL (ref 11.1–15.9)
MCH: 29.3 pg (ref 26.6–33.0)
MCHC: 32.8 g/dL (ref 31.5–35.7)
MCV: 89 fL (ref 79–97)
Platelets: 231 10*3/uL (ref 150–450)
RBC: 5.12 x10E6/uL (ref 3.77–5.28)
RDW: 12.6 % (ref 11.7–15.4)
WBC: 11.7 10*3/uL — ABNORMAL HIGH (ref 3.4–10.8)

## 2023-03-29 ENCOUNTER — Ambulatory Visit: Payer: Medicare HMO | Attending: Cardiology | Admitting: *Deleted

## 2023-03-29 DIAGNOSIS — Z5181 Encounter for therapeutic drug level monitoring: Secondary | ICD-10-CM | POA: Diagnosis not present

## 2023-03-29 DIAGNOSIS — Z954 Presence of other heart-valve replacement: Secondary | ICD-10-CM | POA: Diagnosis not present

## 2023-03-29 DIAGNOSIS — I059 Rheumatic mitral valve disease, unspecified: Secondary | ICD-10-CM

## 2023-03-29 LAB — POCT INR: INR: 2.4 (ref 2.0–3.0)

## 2023-03-29 NOTE — Patient Instructions (Signed)
Description   Today take 1.5 tablets of warfarin then START taking Warfarin 1 tablet daily except 1.5 tablets of Mondays and Wednesdays. Stay consistent with greens each week. Recheck INR in 4 weeks. Call with any questions new medications or procedures  #[phone] or [phone]

## 2023-04-05 ENCOUNTER — Telehealth: Payer: Self-pay | Admitting: Internal Medicine

## 2023-04-05 MED ORDER — FUROSEMIDE 20 MG PO TABS: 20.0000 mg | ORAL_TABLET | Freq: Every day | ORAL | 3 refills | Status: DC

## 2023-04-05 MED ORDER — METOPROLOL SUCCINATE ER 25 MG PO TB24: 25.0000 mg | ORAL_TABLET | Freq: Two times a day (BID) | ORAL | 3 refills | Status: DC

## 2023-04-05 NOTE — Telephone Encounter (Signed)
Pt's medications were sent to pt's pharmacy as requested. Confirmation received.  

## 2023-04-05 NOTE — Telephone Encounter (Signed)
*  STAT* If patient is at the pharmacy, call can be transferred to refill team.   1. Which medications need to be refilled? (please list name of each medication and dose if known)   metoprolol succinate (TOPROL-XL) 25 MG 24 hr tablet    furosemide (LASIX) 20 MG tablet    4. Which pharmacy/location (including street and city if local pharmacy) is medication to be sent to? [id number] - [place], [place] - [address]     5. Do they need a 30 day or 90 day supply? 90

## 2023-04-16 ENCOUNTER — Other Ambulatory Visit: Payer: Self-pay | Admitting: Internal Medicine

## 2023-04-18 ENCOUNTER — Other Ambulatory Visit: Payer: Self-pay | Admitting: Internal Medicine

## 2023-04-28 ENCOUNTER — Telehealth: Payer: Self-pay | Admitting: *Deleted

## 2023-04-28 ENCOUNTER — Ambulatory Visit: Payer: Medicare HMO

## 2023-04-28 NOTE — Telephone Encounter (Signed)
Called pt since she was not seen due to weather delay.  She stated she is also on prednisone 20mg  taper dose-3 tabs x 2 days, 2 tabs x 2 days, 1 tab x 2 days then 1/2 tab and doxycycline 100mg  bid x 10 days. She is aware both meds interact with warfarin and advised will have her take 1/2 tablet of warfarin today and 1/2 tablet of warfarin tomorrow then have INR checked on Friday. She was advised she could have some leafy green vegetables.

## 2023-04-30 ENCOUNTER — Ambulatory Visit: Payer: Medicare HMO | Attending: Internal Medicine | Admitting: *Deleted

## 2023-04-30 DIAGNOSIS — Z5181 Encounter for therapeutic drug level monitoring: Secondary | ICD-10-CM | POA: Diagnosis not present

## 2023-04-30 DIAGNOSIS — Z954 Presence of other heart-valve replacement: Secondary | ICD-10-CM

## 2023-04-30 LAB — POCT INR: POC INR: 2.8

## 2023-04-30 NOTE — Patient Instructions (Addendum)
Description   Continue to take warfarin 1 tablet daily except for 1.5 tablets on Mondays and Wednesdays. Eat an extra serving of greens over the weekend. Recheck INR in 1 week. Call with any questions new medications or procedures  #[phone] or [phone]

## 2023-05-05 ENCOUNTER — Ambulatory Visit: Payer: Medicare HMO | Attending: Internal Medicine

## 2023-05-05 DIAGNOSIS — Z954 Presence of other heart-valve replacement: Secondary | ICD-10-CM | POA: Diagnosis not present

## 2023-05-05 LAB — POCT INR: INR: 3.9 — AB (ref 2.0–3.0)

## 2023-05-05 NOTE — Patient Instructions (Signed)
Description   Only take 1 tablet today and then continue to take warfarin 1 tablet daily except for 1.5 tablets on Mondays and Wednesdays. Recheck INR in 2 weeks.  Call with any questions new medications or procedures  #[phone]

## 2023-05-12 ENCOUNTER — Other Ambulatory Visit: Payer: Self-pay

## 2023-05-12 ENCOUNTER — Emergency Department (HOSPITAL_COMMUNITY)
Admission: EM | Admit: 2023-05-12 | Discharge: 2023-06-05 | Disposition: E | Payer: Medicare HMO | Attending: Emergency Medicine | Admitting: Emergency Medicine

## 2023-05-12 ENCOUNTER — Emergency Department (HOSPITAL_COMMUNITY): Payer: Medicare HMO

## 2023-05-12 DIAGNOSIS — R519 Headache, unspecified: Secondary | ICD-10-CM | POA: Diagnosis present

## 2023-05-12 DIAGNOSIS — Z79899 Other long term (current) drug therapy: Secondary | ICD-10-CM | POA: Diagnosis not present

## 2023-05-12 DIAGNOSIS — I469 Cardiac arrest, cause unspecified: Secondary | ICD-10-CM | POA: Insufficient documentation

## 2023-05-12 DIAGNOSIS — S062XAA Diffuse traumatic brain injury with loss of consciousness status unknown, initial encounter: Secondary | ICD-10-CM

## 2023-05-12 DIAGNOSIS — Z7901 Long term (current) use of anticoagulants: Secondary | ICD-10-CM | POA: Insufficient documentation

## 2023-05-12 DIAGNOSIS — I619 Nontraumatic intracerebral hemorrhage, unspecified: Secondary | ICD-10-CM | POA: Diagnosis not present

## 2023-05-12 DIAGNOSIS — S064X9A Epidural hemorrhage with loss of consciousness of unspecified duration, initial encounter: Secondary | ICD-10-CM | POA: Diagnosis not present

## 2023-05-12 DIAGNOSIS — I621 Nontraumatic extradural hemorrhage: Secondary | ICD-10-CM | POA: Diagnosis not present

## 2023-05-12 DIAGNOSIS — S065XAA Traumatic subdural hemorrhage with loss of consciousness status unknown, initial encounter: Secondary | ICD-10-CM

## 2023-05-12 DIAGNOSIS — S06A0XA Traumatic brain compression without herniation, initial encounter: Secondary | ICD-10-CM

## 2023-05-12 DIAGNOSIS — S066X3A Traumatic subarachnoid hemorrhage with loss of consciousness of 1 hour to 5 hours 59 minutes, initial encounter: Secondary | ICD-10-CM

## 2023-05-12 LAB — CBC WITH DIFFERENTIAL/PLATELET
Abs Immature Granulocytes: 0.25 10*3/uL — ABNORMAL HIGH (ref 0.00–0.07)
Basophils Absolute: 0.1 10*3/uL (ref 0.0–0.1)
Basophils Relative: 0 %
Eosinophils Absolute: 0 10*3/uL (ref 0.0–0.5)
Eosinophils Relative: 0 %
HCT: 39.3 % (ref 36.0–46.0)
Hemoglobin: 13 g/dL (ref 12.0–15.0)
Immature Granulocytes: 1 %
Lymphocytes Relative: 13 %
Lymphs Abs: 3.3 10*3/uL (ref 0.7–4.0)
MCH: 29.8 pg (ref 26.0–34.0)
MCHC: 33.1 g/dL (ref 30.0–36.0)
MCV: 90.1 fL (ref 80.0–100.0)
Monocytes Absolute: 1.4 10*3/uL — ABNORMAL HIGH (ref 0.1–1.0)
Monocytes Relative: 5 %
Neutro Abs: 21 10*3/uL — ABNORMAL HIGH (ref 1.7–7.7)
Neutrophils Relative %: 81 %
Platelets: 201 10*3/uL (ref 150–400)
RBC: 4.36 MIL/uL (ref 3.87–5.11)
RDW: 13.8 % (ref 11.5–15.5)
WBC: 26 10*3/uL — ABNORMAL HIGH (ref 4.0–10.5)
nRBC: 0 % (ref 0.0–0.2)

## 2023-05-12 LAB — COMPREHENSIVE METABOLIC PANEL
ALT: 14 U/L (ref 0–44)
AST: 25 U/L (ref 15–41)
Albumin: 3.7 g/dL (ref 3.5–5.0)
Alkaline Phosphatase: 65 U/L (ref 38–126)
Anion gap: 12 (ref 5–15)
BUN: 17 mg/dL (ref 8–23)
CO2: 21 mmol/L — ABNORMAL LOW (ref 22–32)
Calcium: 8.6 mg/dL — ABNORMAL LOW (ref 8.9–10.3)
Chloride: 105 mmol/L (ref 98–111)
Creatinine, Ser: 0.72 mg/dL (ref 0.44–1.00)
GFR, Estimated: >60 mL/min (ref >60)
Glucose, Bld: 230 mg/dL — ABNORMAL HIGH (ref 70–99)
Potassium: 3.2 mmol/L — ABNORMAL LOW (ref 3.5–5.1)
Sodium: 138 mmol/L (ref 135–145)
Total Bilirubin: 0.7 mg/dL (ref 0.0–1.2)
Total Protein: 6.8 g/dL (ref 6.5–8.1)

## 2023-05-12 LAB — CBG MONITORING, ED: Glucose-Capillary: 203 mg/dL — ABNORMAL HIGH (ref 70–99)

## 2023-05-12 LAB — TROPONIN I (HIGH SENSITIVITY): Troponin I (High Sensitivity): 4 ng/L (ref <18)

## 2023-05-12 LAB — APTT: aPTT: 66 s — ABNORMAL HIGH (ref 24–36)

## 2023-05-12 LAB — PROTIME-INR
INR: 3.5 — ABNORMAL HIGH (ref 0.8–1.2)
Prothrombin Time: 35.6 s — ABNORMAL HIGH (ref 11.4–15.2)

## 2023-05-12 LAB — ETHANOL: Alcohol, Ethyl (B): <10 mg/dL (ref <10)

## 2023-05-12 LAB — SODIUM: Sodium: 137 mmol/L (ref 135–145)

## 2023-05-12 LAB — LACTIC ACID, PLASMA: Lactic Acid, Venous: 1.9 mmol/L (ref 0.5–1.9)

## 2023-05-12 MED ORDER — CLEVIDIPINE BUTYRATE 0.5 MG/ML IV EMUL
0.0000 mg/h | INTRAVENOUS | Status: DC
  Administered 2023-05-12: 2 mg/h via INTRAVENOUS
  Filled 2023-05-12: qty 50

## 2023-05-12 MED ORDER — PROPOFOL 1000 MG/100ML IV EMUL
INTRAVENOUS | Status: AC
  Filled 2023-05-12: qty 100

## 2023-05-12 MED ORDER — ETOMIDATE 2 MG/ML IV SOLN: 20.0000 mg | Freq: Once | INTRAVENOUS | Status: DC

## 2023-05-12 MED ORDER — ETOMIDATE 2 MG/ML IV SOLN
INTRAVENOUS | Status: AC
  Administered 2023-05-12: 20 mg
  Filled 2023-05-12: qty 20

## 2023-05-12 MED ORDER — PROPOFOL 1000 MG/100ML IV EMUL
5.0000 ug/kg/min | INTRAVENOUS | Status: DC
  Administered 2023-05-12: 20 ug/kg/min via INTRAVENOUS

## 2023-05-12 MED ORDER — VITAMIN K1 10 MG/ML IJ SOLN
10.0000 mg | INTRAVENOUS | Status: DC
  Filled 2023-05-12: qty 1

## 2023-05-12 MED ORDER — SUCCINYLCHOLINE CHLORIDE 200 MG/10ML IV SOSY: 120.0000 mg | PREFILLED_SYRINGE | Freq: Once | INTRAVENOUS | Status: DC

## 2023-05-12 MED ORDER — SODIUM CHLORIDE 3 % IV BOLUS
250.0000 mL | Freq: Once | INTRAVENOUS | Status: DC
  Filled 2023-05-12 (×2): qty 500

## 2023-05-12 MED ORDER — PROPOFOL 1000 MG/100ML IV EMUL: 5.0000 ug/kg/min | INTRAVENOUS | Status: DC

## 2023-05-12 MED ORDER — SODIUM CHLORIDE 3 % IV SOLN
INTRAVENOUS | Status: DC
  Filled 2023-05-12 (×3): qty 500

## 2023-05-12 MED ORDER — SUCCINYLCHOLINE CHLORIDE 200 MG/10ML IV SOSY
PREFILLED_SYRINGE | INTRAVENOUS | Status: AC
  Administered 2023-05-12: 120 mg
  Filled 2023-05-12: qty 10

## 2023-05-12 MED ORDER — PROTHROMBIN COMPLEX CONC HUMAN 500 UNITS IV KIT
1590.0000 [IU] | PACK | Status: DC
  Filled 2023-05-12: qty 1590

## 2023-05-17 LAB — CULTURE, BLOOD (ROUTINE X 2)
Culture: NO GROWTH
Culture: NO GROWTH
Special Requests: ADEQUATE
Special Requests: ADEQUATE

## 2023-06-05 NOTE — ED Notes (Signed)
 Husband, son and daughter at bedside.

## 2023-06-05 NOTE — Consult Note (Signed)
 [facility] Telemedicine Consult   Requesting Provider: Fairy Sermon Consult Participants: Myself, bedside nurse, atrium nurse,  Location of the provider: [facility] Location of the patient: [facility]    This consult was provided via telemedicine with 2-way video and audio communication. The patient/family was informed that care would be provided in this way and agreed to receive care in this manner.    Chief Complaint: Headache x 2 weeks, unresponsive today  HPI: Priscilla Stevens is a 73 y.o. woman with PMHx significant for afib on eliquis, HTN,  HLD, DM, COPD, BMI 28.25, fibromyalgia  She has been having headaches for 2 weeks, treated for sinus infection with prednisone  and antibiotics for concern for sinus infection.   Today was last seen at 3 PM when she stated she was going to lay down, then found down in the bathroom by her husband, non-responsive. EMS called, code stroke activated, glucose in the 200s, BP 80s/50s for EMS (unclear which arm was checked, left subclavian steal syndrome makes left arm BP unreliable), agonal respirations, brief runs of V-tach, heart rates then in the 40s, 93 rectal temp on arrival.   Intubated on arrival.   LKW: 3 PM Thrombolytic given?: No, ICH IR Thrombectomy? No, ICH Time of teleneurologist evaluation: 6  Exam: Vitals:   05-25-23 1745 05/25/23 1749  BP: (!) 180/101   Pulse: 65   Resp: 17   Temp:  (!) 93.1 F (33.9 C)  SpO2: 100%     General: Unresponsive  Pulmonary: bucking vent Cardiac: afib on monitor 40s to 70s    NIH Stroke scale 1A: Level of Consciousness - 2 1B: Ask Month and Age - 2 1C: 'Blink Eyes' & 'Squeeze Hands' - 2 2: Test Horizontal Extraocular Movements - unable to assess 3: Test Visual Fields - 3  4: Test Facial Palsy - unable to assess due to ETT 5A: Test Left Arm Motor Drift - 3 5B: Test Right Arm Motor Drift - 3 6A: Test Left Leg Motor Drift - 3 6B: Test Right Leg Motor Drift - 3 7: Test  Limb Ataxia - 0 8: Test Sensation - 0 9: Test Language/Aphasia- 3 10: Test Dysarthria - 2 11: Test Extinction/Inattention - 0 NIHSS score: 26   Imaging Reviewed:  Head CT with extensive epidural hemorrhage, SDH, SAH, with midline shift and signs of herniation  Labs reviewed in epic and pertinent values follow:  Basic Metabolic Panel: Recent Labs  Lab May 25, 2023 1755  NA 138  K 3.2*  CL 105  CO2 21*  GLUCOSE 230*  BUN 17  CREATININE 0.72  CALCIUM  8.6*    CBC: Recent Labs  Lab 05-25-2023 1755  WBC 26.0*  NEUTROABS PENDING  HGB 13.0  HCT 39.3  MCV 90.1  PLT 201    Coagulation Studies: Recent Labs    May 25, 2023 1755  LABPROT 35.6*  INR 3.5*       Assessment: Symptomatic epidural/subdural/SAH with supratherapeutic INR  Recommendations:  - Emergent neurosurgical consultation - Reversal of anticoagulation  - 3% hypertonic saline 250 cc bolus, 50 cc/hr as a temporizing measure; defer to neurosurgery if this should be discontinued - SBP goal 130-150 pending further recommendations from neurosurgery, clevidipine  ordered to labile HR and BP - Appreciate management of vent/comorbidites per ED, Neurosurgery and primary team - Neurology will sign off at this time but please reach out if we may help further in the care of this patient  This patient is receiving care for possible acute neurological changes. There  was 50 minutes of care by this provider at the time of service, including time for direct evaluation via telemedicine, review of medical records, imaging studies and discussion of findings with providers, the patient and/or family.   Priscilla Jernigan MD-PhD [facility] [phone]   If 8pm-8am, please page neurology on call as listed in AMION.  CRITICAL CARE Performed by: Priscilla Stevens   Total critical care time: 50 minutes  Critical care time was exclusive of separately billable procedures and treating other patients.  Critical care was  necessary to treat or prevent imminent or life-threatening deterioration. Emergent evaluation for acute neurological changes  Critical care was time spent personally by me on the following activities: development of treatment plan with patient and/or surrogate as well as nursing, discussions with consultants, evaluation of patient's response to treatment, examination of patient, obtaining history from patient or surrogate, ordering and performing treatments and interventions, ordering and review of laboratory studies, ordering and review of radiographic studies, pulse oximetry and re-evaluation of patient's condition.

## 2023-06-05 NOTE — ED Notes (Addendum)
Pt's husband told Christy RN & myself that pt's sister is on the way to the facility. Once she arrives pt's family wishes to only provide pt with comfort measures. EDP made aware.

## 2023-06-05 NOTE — ED Provider Notes (Signed)
 [place] [facility]ERGENCY DEPARTMENT AT [facility] Provider Note   CSN: [id number] Arrival date & time: June 10, 2023  1732     History  Chief Complaint  Patient presents with   Code Stroke    Shirly LYVIA MONDESIR is a 73 y.o. female.  Patient was on Eliquis.  She has been complaining of a headache for a number days.  Her husband was unable to wake her.  The history is provided by the EMS personnel. No language interpreter was used.  Altered Mental Status Presenting symptoms: behavior changes   Severity:  Severe Most recent episode:  Today Episode history:  Single Timing:  Constant Chronicity:  New Context: not alcohol use   Associated symptoms: no abdominal pain        Home Medications Prior to Admission medications   Medication Sig Start Date End Date Taking? Authorizing Provider  amoxicillin  (AMOXIL ) 875 MG tablet Take 875 mg by mouth 2 (two) times daily. 03/18/23  Yes [provider]  benzonatate  (TESSALON ) 200 MG capsule Take 200 mg by mouth 3 (three) times daily as needed. 03/18/23  Yes [provider]  doxycycline (ADOXA) 100 MG tablet Take 100 mg by mouth 2 (two) times daily. 04/27/23  Yes [provider]  guaiFENesin -codeine 100-10 MG/5ML syrup Take 5 mLs by mouth 3 (three) times daily as needed. 03/18/23  Yes [provider]  albuterol  (PROVENTIL  HFA;VENTOLIN  HFA) 108 (90 BASE) MCG/ACT inhaler Inhale 2 puffs into the lungs every 6 (six) hours as needed for wheezing or shortness of breath. 10/19/14   Lelon Hamilton T, PA-C  ALPRAZolam  (XANAX ) 0.25 MG tablet Take 1 tablet (0.25 mg total) by mouth at bedtime as needed for sleep or anxiety. 07/04/12   Dusty Sudie DEL, MD  amLODipine  (NORVASC ) 5 MG tablet TAKE 1 TABLET (5 MG TOTAL) BY MOUTH DAILY. 04/19/23   Ross, Paula V, MD  azelastine (ASTELIN) 0.1 % nasal spray Place 2 sprays into the nose 2 (two) times daily. 12/31/22   [provider]  calcium  carbonate (OS-CAL) 600 MG TABS  Take 600 mg by mouth 2 (two) times daily with a meal.     [provider]  Cholecalciferol 25 MCG (1000 UT) tablet Take by mouth.    [provider]  esomeprazole (NEXIUM) 20 MG capsule Take 40 mg by mouth daily.    [provider]  ezetimibe (ZETIA) 10 MG tablet Take 10 mg by mouth daily. 12/10/19   [provider]  furosemide  (LASIX ) 20 MG tablet Take 1 tablet (20 mg total) by mouth daily. 04/05/23   Rana Lum CROME, NP  levocetirizine (XYZAL) 5 MG tablet Take 5 mg by mouth daily. 12/30/20   [provider]  metoprolol  succinate (TOPROL -XL) 25 MG 24 hr tablet Take 1 tablet (25 mg total) by mouth in the morning and at bedtime. 04/05/23   Rana Lum CROME, NP  montelukast (SINGULAIR) 10 MG tablet Take 1 tablet by mouth daily. 12/16/22   [provider]  pantoprazole  (PROTONIX ) 40 MG tablet Take 40 mg by mouth daily.    [provider]  predniSONE  (DELTASONE ) 20 MG tablet Take 20 mg by mouth as directed. Take 3 tabs on first day, then 2 tabs daily for 2 days, then 1 tab daily for 2 days, then 1/2 tab daily for 2 days 04/27/23   [provider]  rosuvastatin  (CRESTOR ) 10 MG tablet Take 10 mg by mouth at bedtime. 06-10-23   [provider]  warfarin (COUMADIN ) 5  MG tablet TAKE 1 TABLET BY MOUTH EVERY DAY OR AS DIRECTED BY ANTICOAGULATION CLINIC 03/15/23   Okey Vina GAILS, MD      Allergies    Aspirin  and Statins    Review of Systems   Review of Systems  Unable to perform ROS: Patient unresponsive  Gastrointestinal:  Negative for abdominal pain.    Physical Exam Updated Vital Signs BP 138/78   Pulse 89   Temp (!) 93.1 F (33.9 C) (Rectal)   Resp 17   Ht 5' 5 (1.651 m)   Wt 77 kg   SpO2 100%   BMI 28.25 kg/m  Physical Exam Vitals and nursing note reviewed.  Constitutional:      Appearance: She is well-developed.     Comments: Patient moves extremities to pain only  HENT:     Head: Normocephalic.      Comments: Pupils midpoint nonreactive    Nose: Nose normal.  Eyes:     General: No scleral icterus.    Conjunctiva/sclera: Conjunctivae normal.  Neck:     Thyroid: No thyromegaly.  Cardiovascular:     Rate and Rhythm: Normal rate and regular rhythm.     Heart sounds: No murmur heard.    No friction rub. No gallop.  Pulmonary:     Breath sounds: No stridor. No wheezing or rales.  Chest:     Chest wall: No tenderness.  Abdominal:     General: There is no distension.     Tenderness: There is no abdominal tenderness. There is no rebound.  Musculoskeletal:        General: Normal range of motion.     Cervical back: Neck supple.  Lymphadenopathy:     Cervical: No cervical adenopathy.  Skin:    Findings: No erythema or rash.  Neurological:     Motor: No abnormal muscle tone.     Coordination: Coordination normal.     Comments: Responding to pain only     ED Results / Procedures / Treatments   Labs (all labs ordered are listed, but only abnormal results are displayed) Labs Reviewed  PROTIME-INR - Abnormal; Notable for the following components:      Result Value   Prothrombin  Time 35.6 (*)    INR 3.5 (*)    All other components within normal limits  CBC WITH DIFFERENTIAL/PLATELET - Abnormal; Notable for the following components:   WBC 26.0 (*)    Neutro Abs 21.0 (*)    Monocytes Absolute 1.4 (*)    Abs Immature Granulocytes 0.25 (*)    All other components within normal limits  COMPREHENSIVE METABOLIC PANEL - Abnormal; Notable for the following components:   Potassium 3.2 (*)    CO2 21 (*)    Glucose, Bld 230 (*)    Calcium  8.6 (*)    All other components within normal limits  APTT - Abnormal; Notable for the following components:   aPTT 66 (*)    All other components within normal limits  CBG MONITORING, ED - Abnormal; Notable for the following components:   Glucose-Capillary 203 (*)    All other components within normal limits  CULTURE, BLOOD (ROUTINE X 2)   CULTURE, BLOOD (ROUTINE X 2)  RESP PANEL BY RT-PCR (RSV, FLU A&B, COVID)  RVPGX2  ETHANOL  LACTIC ACID, PLASMA  SODIUM  DIFFERENTIAL  RAPID URINE DRUG SCREEN, HOSP PERFORMED  URINALYSIS, ROUTINE W REFLEX MICROSCOPIC  SODIUM  SODIUM  I-STAT CHEM 8, ED  TROPONIN I (HIGH SENSITIVITY)  EKG None  Radiology DG Chest Portable 1 View Result Date: 2023-06-02 CLINICAL DATA:  ETT and OG tube placement EXAM: PORTABLE CHEST 1 VIEW COMPARISON:  02/11/2016 FINDINGS: Endotracheal tube tip in the intrathoracic trachea 2.5 cm from the carina. Enteric tube tip in the mid esophagus. Sternotomy and cardiac valve prosthesis. Stable cardiomediastinal silhouette. Diffuse interstitial coarsening. No focal consolidation, pleural effusion, or pneumothorax. IMPRESSION: 1. Endotracheal tube tip in the intrathoracic trachea 2.5 cm from the carina. 2. Enteric tube tip in the mid esophagus. Recommend advancement approximately 14 cm. 3. Diffuse interstitial coarsening may be due to low lung volumes or mild edema. Electronically Signed   By: Norman Gatlin M.D.   On: 06-02-2023 18:56   CT HEAD CODE STROKE WO CONTRAST Result Date: Jun 02, 2023 CLINICAL DATA:  Code stroke. Neuro deficit, acute, stroke suspected. Patient was found unresponsive. She had been complaining of migraine headaches for 2 days. Bradycardia and hypotension. EXAM: CT HEAD WITHOUT CONTRAST TECHNIQUE: Contiguous axial images were obtained from the base of the skull through the vertex without intravenous contrast. RADIATION DOSE REDUCTION: This exam was performed according to the departmental dose-optimization program which includes automated exposure control, adjustment of the mA and/or kV according to patient size and/or use of iterative reconstruction technique. COMPARISON:  None Available. FINDINGS: Brain: 80 mixed left extra-axial hemorrhage is present. Extensive subdural component extends over the convexity. A more lentiform collection with a fluid level  is noted adjacent to the more anterior left frontal lobe measuring up to 3.1 cm on the coronal images. This may be epidural. It creates significant mass effect on the anterior left frontal lobe effacing the sulci and resulting in to 20 mm of midline shift. The temporal horn of the right lateral ventricle is slightly dilated. Uncal herniation is present with mass effect on the midbrain. Downward herniation of the cerebellar tonsils is present with partial effacement of the fourth lateral ventricles. The tonsils extend to the foramen magnum. Vascular: No hyperdense vessel or unexpected calcification. Skull: Calvarium is intact. No focal lytic or blastic lesions are present. No significant extracranial soft tissue lesion is present. Sinuses/Orbits: The paranasal sinuses and mastoid air cells are clear. Bilateral lens replacements are noted. Globes and orbits are otherwise unremarkable. IMPRESSION: 1. Large mixed left extra-axial hemorrhage measuring up to 3.1 cm on the coronal images. The largest component is likely epidural. 2. The hemorrhage creates significant mass effect on the anterior left frontal lobe effacing the sulci and resulting in to 20 mm of midline shift. 3. Uncal herniation with mass effect on the midbrain. 4. Downward herniation of the cerebellar tonsils with partial effacement of the fourth lateral ventricles. The tonsils extend to the foramen magnum. Critical Value/emergent results were called by telephone at the time of interpretation on 2023/06/02 at 6:09 pm to provider [facility] Kamylle Axelson , who verbally acknowledged these results. Electronically Signed   By: Lonni Necessary M.D.   On: 06-02-23 18:16    Procedures Procedures    Medications Ordered in ED Medications  clevidipine  (CLEVIPREX ) infusion 0.5 mg/mL (0 mg/hr Intravenous Stopped 06-02-23 2038)  sodium chloride  3% (hypertonic) IV bolus 250 mL (250 mLs Intravenous Patient Refused/Not Given 06/02/23 2007)  sodium chloride  (hypertonic) 3  % solution ( Intravenous Not Given 2023-06-02 2056)  propofol  (DIPRIVAN ) 1000 MG/100ML infusion ( Intravenous Not Given June 02, 2023 2056)  prothrombin  complex conc human (KCENTRA ) IVPB 1,590 Units (1,590 Units Intravenous Patient Refused/Not Given 06-02-23 2008)  phytonadione  (VITAMIN K) 10 mg in dextrose  5 % 50 mL IVPB (  0 mg Intravenous Stopped May 14, 2023 2057)  succinylcholine  (ANECTINE ) 200 MG/10ML syringe (120 mg  Given 05/14/2023 1742)  etomidate  (AMIDATE ) 2 MG/ML injection (20 mg  Given 05/14/2023 1740)    ED Course/ Medical Decision Making/ A&P  Patient was declared dead at 8:50 PM.  I spoke with Dr. Alto who was on-call for Dr. Sophronia.  Dr. Sophronia is listed as the primary care physician for this patient.  She was recently seen at [facility] on January 25.  She may have been seen by Hoy Miu, PA-C.  Dr. Alto stated that we can send the test certificate to the office for Dr. Sophronia   I spoke with neurosurgery Dr. Joshua and he stated that the patient will not survive this cerebral hemorrhage.  He recommended talk to the patient about withdrawing support and letting her die peacefully I spoke with the family and they agreed that after the relatives arrives in the next couple hours then we will extubate the patient and let her die peacefully.    } CRITICAL CARE Performed by: Fairy Sermon Total critical care time: 66 minutes Critical care time was exclusive of separately billable procedures and treating other patients. Critical care was necessary to treat or prevent imminent or life-threatening deterioration. Critical care was time spent personally by me on the following activities: development of treatment plan with patient and/or surrogate as well as nursing, discussions with consultants, evaluation of patient's response to treatment, examination of patient, obtaining history from patient or surrogate, ordering and performing treatments and interventions, ordering and review of laboratory studies,  ordering and review of radiographic studies, pulse oximetry and re-evaluation of patient's condition.                              Medical Decision Making Amount and/or Complexity of Data Reviewed Labs: ordered. Radiology: ordered. ECG/medicine tests: ordered.  Risk Prescription drug management.   Cerebral hemorrhage leading to cardiopulmonary arrest  Final Clinical Impression(s) / ED Diagnoses Final diagnoses:  None    Rx / DC Orders ED Discharge Orders     None         Sermon Fairy, MD 05/17/23 1101

## 2023-06-05 NOTE — ED Notes (Addendum)
 Family at bedside. Husband states they are ready to withdraw "life support."  EDP made aware & at bedside.

## 2023-06-05 NOTE — ED Notes (Signed)
 Saline eye prep completed by ED tech Nellie Banas

## 2023-06-05 NOTE — Progress Notes (Signed)
 Patient extubated to 2 lpm nasal cannula for comfort. Patient suctioned before extubated then oral suctioned after before family was brought back into room.

## 2023-06-05 NOTE — ED Triage Notes (Signed)
 Pt arrives from home called out with LSN 330 c/o migraine X2 days, EMS called by husband . Was found by husband down in bathroom. Un responsive to stimuli upon arrival to ED. EMS rpeorts run of Vtach with HR in 40s and BP 80/50. CBG 224 per EMS.   [place] ED MD at bedside preparing to intubate upon arrival.

## 2023-06-05 NOTE — ED Notes (Signed)
120mg   of succinylcholine given, not 20 mg documented. Unable to edit mar

## 2023-06-05 NOTE — ED Notes (Signed)
 Received report from Carmelo Chock RN. AC mixing meds. Pt does respond to pain and also posturing intermittently. Iv 18 g to R ac bleeding at site, iv taken out and pressure held due to free flow bleeding.

## 2023-06-05 NOTE — Procedures (Signed)
 Extubation Procedure Note  Patient Details:   Name: Priscilla Stevens DOB: [birth year] MRN: [id number]   Airway Documentation:  Airway 7.5 mm (Active)  Secured at (cm) 23 cm 2023-05-18 1800  Measured From Lips 05/18/2023 1800  Secured Location Right 18-May-2023 1800  Secured By [employer] 05-18-23 1800  Bite Block No 05-18-2023 1800  Prone position No 18-May-2023 1800  Head position Right 05-18-23 1800  Cuff Pressure (cm H2O) Clear OR 27-39 CmH2O 2023/05/18 1800  Site Condition Dry 18-May-2023 1800   Vent end date: (not recorded) Vent end time: (not recorded)   Evaluation  O2 sats: stable throughout Complications: No apparent complications Patient did tolerate procedure well. Bilateral Breath Sounds:  (coarse)   No Patient extubated to comfort care. [name] 18-May-2023, 8:28 PM

## 2023-06-05 NOTE — Progress Notes (Signed)
   06-02-2023 2135  Spiritual Encounters  Type of Visit Initial  Care provided to: Pt not available  Referral source Other (comment)  Reason for visit Patient death  OnCall Visit Yes   Chaplain responded to a call for support of the patient's family.  Family left just before I arrived.  Trish [employer]  [phone]

## 2023-06-05 NOTE — ED Notes (Signed)
EDP talking with family due to neuro suggesting to stop everything and make comfort care.
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
## 2023-06-05 NOTE — ED Notes (Signed)
Pt. Arrived to CT

## 2023-06-05 NOTE — ED Notes (Signed)
 [facility] notified: Referral # [id number], representative [facility] pt potential for tissue & eye.

## 2023-06-05 NOTE — Progress Notes (Signed)
 PHARMACY CONSULT NOTE - Hypertonic Saline  Pharmacy Consult for Hypertonic Saline Monitoring and Management  Recent Labs: Potassium (mmol/L)  Date Value  03/16/2023 3.9   Magnesium  (mg/dL)  Date Value  87/82/7985 2.2   Calcium  (mg/dL)  Date Value  87/89/7975 9.6   Albumin  (g/dL)  Date Value  87/89/7975 4.8   Sodium (mmol/L)  Date Value  03/16/2023 145 (H)   Assessment  Priscilla Stevens is a 73 y.o. female presenting after being found unresponsive at home. PMH significant for afib on eliquis, HTN, HLD, DM, COPD. CT imaging showing extensive epidural hemorrhage. Pharmacy has been consulted to monitor hypertonic saline (3%) infusion.  Pertinent medications: N/A  Goal of Therapy:  Goal sodium level 150-155 (per [facility] neurology group) Increase in Na by 4-6 mEq/L in 4-6 hours Do not exceed increase in Na by 10-12 mEq/L in 24 hours  Monitoring:  Date Time Na Rate/Comment  2/5 1755 138 Baseline  Plan:  Give 3% saline bolus of 250 mL x1 Initiate 3% saline at 50 mL/hr per neurology Check Na Q6H  Stop infusion if  Na increases > 6 mEq/L in 6 hours  Na level > 160  Continue to monitor for signs of clinical improvement and recommendations per nephrology  Thank you for involving pharmacy in this patient's care.   Damien Napoleon, PharmD Clinical Pharmacist 31-May-2023 6:27 PM

## 2023-06-05 NOTE — ED Notes (Signed)
Pt. Back in room from CT.

## 2023-06-05 NOTE — ED Notes (Signed)
Pt extubated, family at bedside 

## 2023-06-05 NOTE — ED Notes (Signed)
Pupils fixed and dilated

## 2023-06-05 DEATH — deceased

## 2023-07-27 ENCOUNTER — Ambulatory Visit: Payer: Medicare HMO | Admitting: Cardiovascular Disease
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
